# Patient Record
Sex: Female | Born: 1950
Health system: Southern US, Community
[De-identification: ages and names within clinical notes are randomized; demographics above are authoritative.]

## PROBLEM LIST (undated history)

## (undated) DIAGNOSIS — M26629 Arthralgia of temporomandibular joint, unspecified side: Secondary | ICD-10-CM

## (undated) DIAGNOSIS — G4733 Obstructive sleep apnea (adult) (pediatric): Secondary | ICD-10-CM

## (undated) DIAGNOSIS — G47419 Narcolepsy without cataplexy: Secondary | ICD-10-CM

## (undated) DIAGNOSIS — K219 Gastro-esophageal reflux disease without esophagitis: Secondary | ICD-10-CM

## (undated) DIAGNOSIS — I341 Nonrheumatic mitral (valve) prolapse: Secondary | ICD-10-CM

## (undated) DIAGNOSIS — Z8739 Personal history of other diseases of the musculoskeletal system and connective tissue: Secondary | ICD-10-CM

## (undated) DIAGNOSIS — Z9289 Personal history of other medical treatment: Secondary | ICD-10-CM

## (undated) DIAGNOSIS — F32A Depression, unspecified: Secondary | ICD-10-CM

## (undated) DIAGNOSIS — K449 Diaphragmatic hernia without obstruction or gangrene: Secondary | ICD-10-CM

## (undated) DIAGNOSIS — R002 Palpitations: Secondary | ICD-10-CM

## (undated) DIAGNOSIS — E785 Hyperlipidemia, unspecified: Secondary | ICD-10-CM

## (undated) DIAGNOSIS — T7840XA Allergy, unspecified, initial encounter: Secondary | ICD-10-CM

## (undated) DIAGNOSIS — D649 Anemia, unspecified: Secondary | ICD-10-CM

## (undated) DIAGNOSIS — M810 Age-related osteoporosis without current pathological fracture: Secondary | ICD-10-CM

## (undated) DIAGNOSIS — G4730 Sleep apnea, unspecified: Secondary | ICD-10-CM

## (undated) DIAGNOSIS — E039 Hypothyroidism, unspecified: Secondary | ICD-10-CM

## (undated) DIAGNOSIS — I10 Essential (primary) hypertension: Secondary | ICD-10-CM

## (undated) HISTORY — DX: Allergy, unspecified, initial encounter: T78.40XA

## (undated) HISTORY — PX: ABDOMINAL HYSTERECTOMY: SHX81

## (undated) HISTORY — DX: Depression, unspecified: F32.A

## (undated) HISTORY — PX: EYE SURGERY: SHX253

## (undated) HISTORY — DX: Hyperlipidemia, unspecified: E78.5

## (undated) HISTORY — PX: OTHER SURGICAL HISTORY: SHX169

## (undated) HISTORY — DX: Age-related osteoporosis without current pathological fracture: M81.0

## (undated) HISTORY — PX: BREAST CYST ASPIRATION: SHX578

## (undated) HISTORY — DX: Arthralgia of temporomandibular joint, unspecified side: M26.629

## (undated) HISTORY — PX: ROTATOR CUFF REPAIR: SHX139

## (undated) HISTORY — DX: Anemia, unspecified: D64.9

---
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
## 1998-01-04 ENCOUNTER — Ambulatory Visit: Admission: RE | Admit: 1998-01-04 | Discharge: 1998-01-04 | Payer: Self-pay | Admitting: Family Medicine

## 1998-06-16 ENCOUNTER — Ambulatory Visit: Admission: RE | Admit: 1998-06-16 | Discharge: 1998-06-16 | Payer: Self-pay | Admitting: Internal Medicine

## 2000-11-22 ENCOUNTER — Ambulatory Visit (HOSPITAL_COMMUNITY): Admission: RE | Admit: 2000-11-22 | Discharge: 2000-11-22 | Payer: Self-pay | Admitting: Internal Medicine

## 2000-11-22 ENCOUNTER — Encounter: Payer: Self-pay | Admitting: Internal Medicine

## 2001-02-25 ENCOUNTER — Other Ambulatory Visit: Admission: RE | Admit: 2001-02-25 | Discharge: 2001-02-25 | Payer: Self-pay | Admitting: *Deleted

## 2002-09-07 ENCOUNTER — Ambulatory Visit (HOSPITAL_COMMUNITY): Admission: RE | Admit: 2002-09-07 | Discharge: 2002-09-07 | Payer: Self-pay | Admitting: Internal Medicine

## 2002-09-07 ENCOUNTER — Encounter: Payer: Self-pay | Admitting: Internal Medicine

## 2002-09-30 ENCOUNTER — Encounter: Payer: Self-pay | Admitting: Internal Medicine

## 2002-09-30 ENCOUNTER — Ambulatory Visit (HOSPITAL_COMMUNITY): Admission: RE | Admit: 2002-09-30 | Discharge: 2002-09-30 | Payer: Self-pay | Admitting: Internal Medicine

## 2003-01-28 ENCOUNTER — Encounter: Payer: Self-pay | Admitting: Family Medicine

## 2003-01-28 ENCOUNTER — Ambulatory Visit (HOSPITAL_COMMUNITY): Admission: RE | Admit: 2003-01-28 | Discharge: 2003-01-28 | Payer: Self-pay | Admitting: Family Medicine

## 2003-04-02 ENCOUNTER — Emergency Department (HOSPITAL_COMMUNITY): Admission: EM | Admit: 2003-04-02 | Discharge: 2003-04-02 | Payer: Self-pay | Admitting: *Deleted

## 2003-07-01 ENCOUNTER — Emergency Department (HOSPITAL_COMMUNITY): Admission: EM | Admit: 2003-07-01 | Discharge: 2003-07-01 | Payer: Self-pay | Admitting: Emergency Medicine

## 2003-08-13 ENCOUNTER — Emergency Department (HOSPITAL_COMMUNITY): Admission: EM | Admit: 2003-08-13 | Discharge: 2003-08-14 | Payer: Self-pay | Admitting: *Deleted

## 2006-11-21 ENCOUNTER — Ambulatory Visit (HOSPITAL_COMMUNITY): Admission: RE | Admit: 2006-11-21 | Discharge: 2006-11-22 | Payer: Self-pay | Admitting: Orthopedic Surgery

## 2007-12-17 ENCOUNTER — Ambulatory Visit (HOSPITAL_COMMUNITY): Admission: RE | Admit: 2007-12-17 | Discharge: 2007-12-17 | Payer: Self-pay | Admitting: Family Medicine

## 2007-12-29 ENCOUNTER — Ambulatory Visit: Payer: Self-pay | Admitting: Internal Medicine

## 2008-01-20 ENCOUNTER — Encounter: Payer: Self-pay | Admitting: Internal Medicine

## 2008-01-20 ENCOUNTER — Ambulatory Visit (HOSPITAL_COMMUNITY): Admission: RE | Admit: 2008-01-20 | Discharge: 2008-01-20 | Payer: Self-pay | Admitting: Internal Medicine

## 2008-01-20 ENCOUNTER — Ambulatory Visit: Payer: Self-pay | Admitting: Internal Medicine

## 2008-02-19 ENCOUNTER — Ambulatory Visit: Payer: Self-pay | Admitting: Internal Medicine

## 2010-08-08 ENCOUNTER — Ambulatory Visit (HOSPITAL_COMMUNITY)
Admission: RE | Admit: 2010-08-08 | Discharge: 2010-08-08 | Payer: Self-pay | Source: Home / Self Care | Attending: Internal Medicine | Admitting: Internal Medicine

## 2010-08-15 ENCOUNTER — Encounter (INDEPENDENT_AMBULATORY_CARE_PROVIDER_SITE_OTHER): Payer: Self-pay | Admitting: *Deleted

## 2010-08-31 NOTE — Letter (Signed)
Summary: Unable to Reach, Consult Scheduled  [facility]  [address]   [place], [place] [address]   Phone: [phone]  Fax: [phone]    08/15/2010  Robyn Ashley [address] [place], [place]  [address] 1950/07/31   Dear [name],   We have been unable to reach you by phone.   At the recommendation of DR [facility]  we have been asked to schedule you a consult with DR Jena Gauss for DYSPHAGIA.  Please call our office at [phone].     Thank you,    Robyn Ashley  [facility] R. Roetta Sessions, M.D.    [name], M.D. Lorenza Burton, FNP-BC    Tana Coast, PA-C Phone: [phone]    Fax: [phone]

## 2010-11-13 ENCOUNTER — Other Ambulatory Visit (HOSPITAL_COMMUNITY): Payer: Self-pay | Admitting: Internal Medicine

## 2010-11-13 DIAGNOSIS — M5412 Radiculopathy, cervical region: Secondary | ICD-10-CM

## 2010-11-13 DIAGNOSIS — M542 Cervicalgia: Secondary | ICD-10-CM

## 2010-11-15 ENCOUNTER — Ambulatory Visit (HOSPITAL_COMMUNITY)
Admission: RE | Admit: 2010-11-15 | Discharge: 2010-11-15 | Disposition: A | Discharge status: Home / Self Care | Payer: BC Managed Care – PPO | Source: Ambulatory Visit | Attending: Internal Medicine | Admitting: Internal Medicine

## 2010-11-15 DIAGNOSIS — M5412 Radiculopathy, cervical region: Secondary | ICD-10-CM

## 2010-11-15 DIAGNOSIS — M542 Cervicalgia: Secondary | ICD-10-CM

## 2010-11-20 ENCOUNTER — Ambulatory Visit (HOSPITAL_COMMUNITY)
Admission: RE | Admit: 2010-11-20 | Discharge: 2010-11-20 | Disposition: A | Discharge status: Home / Self Care | Payer: BC Managed Care – PPO | Source: Ambulatory Visit | Attending: Internal Medicine | Admitting: Internal Medicine

## 2010-11-20 DIAGNOSIS — R209 Unspecified disturbances of skin sensation: Secondary | ICD-10-CM | POA: Insufficient documentation

## 2010-11-20 DIAGNOSIS — M5030 Other cervical disc degeneration, unspecified cervical region: Secondary | ICD-10-CM | POA: Insufficient documentation

## 2010-11-20 DIAGNOSIS — M542 Cervicalgia: Secondary | ICD-10-CM | POA: Insufficient documentation

## 2010-12-12 NOTE — Consult Note (Signed)
NAMEKENSLEI, HEARTY               ACCOUNT NO.:  [id number]   MEDICAL RECORD NO.:  [id number]          PATIENT TYPE:  AMB   LOCATION:  DAY                           FACILITY:  [facility]   PHYSICIAN:  R. Roetta Sessions, M.D. DATE OF BIRTH:  05/24/1951   DATE OF CONSULTATION:  12/29/2007  DATE OF DISCHARGE:                                 CONSULTATION   REQUESTING PHYSICIAN:  Kirk Ruths, M.D.   CONSULTING PHYSICIAN:  R. Roetta Sessions, M.D.   REASON FOR CONSULTATION:  Chronic nausea for 4 weeks and intermittent  upper abdominal pain.   HISTORY OF PRESENT ILLNESS:  Ms. Robyn Ashley is a 60 year old female.  She  reports a 4-week history of intermittent nausea.  It can last several  hours throughout the day.  She is pretty much having symptoms on a daily  basis.  There is no particular time or day where her symptoms are worse.  There are no certain foods that make her symptoms worse and eating does  not make her symptoms worse.  She complains of a queasy stomach all the  time.  Phenergan seems to help.  She tells me she has not been able to  work.  She also has transient epigastric and right upper quadrant pain,  which she describes as a tenderness.  It usually lasts only a few  minutes, however.  She complains of chronic heartburn and indigestion.  She has been taking Prilosec 40 mg in the a.m., which does seem to help  some.  She occasionally has breakthrough symptoms as well.  Her weight  has remained stable.  Her appetite is quite poor at times.  She denies  any vomiting.  She denies any diarrhea or constipation.  Other than her  normal alterations in bowel movements which she describes as IBS, she  denies any rectal bleeding or melena.  She does note new medication of  INH, which she was started in February 2009 for latent TB.  Lab work  from Dec 15, 2007, shows an AST of 105 and an ALT of 143, otherwise  normal LFTs.  She had a hemoglobin of 13, hematocrit of 39.8, platelet  count of  355, and a white blood cell count of 8.5.  She had an  ultrasound on Dec 17, 2007, which showed gallbladder normally distended  without stones or wall thickening.  CBD was 3.8 mm, otherwise a  completely normal exam.  She had hepatitis A, B, and C markers which  were negative.   PAST MEDICAL AND SURGICAL HISTORY:  1. Narcolepsy.  2. Mitral valve prolapse.  3. IBS.  4. Depression.  5. She has a history of erosive esophagitis with last EGD by Dr. Jena Gauss      on August 12, 1998, and also had a 3-cm hiatal hernia.  6. She has latent TB.  7. Osteoarthritis.  8. Right rotator cuff repair.  9. Status post hysterectomy.  10.Minor surgery.  11.She has had a colonoscopy in 1994 by Dr. Jena Gauss, which was normal.   CURRENT MEDICATIONS:  1. INH 300 mg daily.  2. Wellbutrin 150  mg b.i.d.  3. Ritalin 15 mg t.i.d.  4. Dexedrine 15 mg t.i.d.  5. Prilosec 40 mg daily.  6. Tylenol p.r.n.  7. Advil p.r.n.  8. Phenergan p.r.n.   ALLERGIES:  No known drug allergies.   FAMILY HISTORY:  Positive for maternal grandfather with colon cancer.  Mother is alive with diabetes mellitus and Alzheimer disease.  Father  deceased at 8 secondary to COPD.  She has 7 healthy siblings.   SOCIAL HISTORY:  Ms. Carboni is married.  She has 3 healthy children.  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  She has a 38-pack-a-year  history of tobacco use.  She denies alcohol or drug use.   REVIEW OF SYSTEMS:  See HPI, otherwise negative.   PHYSICAL EXAMINATION:  VITAL SIGNS:  Weight 150 pounds, height 62  inches, temperature 98 degrees, blood pressure 128/80, and pulse 80.  GENERAL:  Ms. Bronk is a well-developed, well-nourished Caucasian  female in no acute distress.  HEENT:  Clear sclerae, nonicteric.  Conjunctivae pink.  Oropharynx is  pink and moist without any lesions.  NECK:  Supple without mass or thyromegaly.  CHEST:  Heart has regular rate and rhythm.  Normal S1 and S2 without any  murmurs, clicks,  rubs, or gallops.  LUNGS:  Clear to auscultation bilaterally.  ABDOMEN:  Positive bowel sounds x4.  No bruits auscultated.  Soft,  nontender, and nondistended.  No palpable mass or hepatosplenomegaly.  No rebound, tenderness, or guarding.  EXTREMITIES:  Without clubbing or edema bilaterally.  SKIN:  Pink, warm, and dry without any rash or jaundice.   IMPRESSION:  Ms. Hellickson is a 60 year old female with a 4-week history of  chronic nausea without emesis.  She also has transient upper abdominal  pain, mostly epigastric and right upper quadrant and was recently found  to have an AST of 105 and ALT of 143.  Abdominal ultrasound showed a  normal gallbladder and common bile duct, and she had acute hepatitis  panel.  Her symptoms are not typical of biliary as her pain is quite  transient and does not necessarily correlate with her nausea which seems  to be a chronic daily problem.  Much of this has started since she has  been started on INH for latent tuberculosis.  I am wondering if she is  not having complications from this particular drug.  Other drugs that  are metabolized in the liver include Dexedrine and Ritalin, all of which  could be contributing to her transaminitis and nausea.  However, prior  to discontinuing any of her medication, we would pursue EGD to rule out  peptic ulcer disease as a culprit of her nausea.  Biliary etiology  should remain in the differential, however.   PLAN:  1. Screening colonoscopy and EGD with Dr. Jena Gauss in near future.  I      discussed both procedures including risks and benefits, including      but not limited to bleeding, infection, perforation, and drug      reaction.  She agrees to the      plan and consent will be obtained.  2. Continue Prilosec 40 mg daily.  3. LFTs on the day of procedure.      Lorenza Burton, N.P.      Jonathon Bellows, M.D.  Electronically Signed    KJ/MEDQ  D:  12/29/2007  T:  12/30/2007  Job:  [id number]   cc:    Kirk Ruths, M.D.  Fax: [phone]

## 2010-12-12 NOTE — Assessment & Plan Note (Signed)
NAMEMarland Ashley  PAISLYN, DOMENICO                CHART#:  [id number]   DATE:  02/19/2008                       DOB:  1951-03-24   PRIMARY CARE PHYSICIAN:  Kirk Ruths, MD   CHIEF COMPLAINT:  Followup transaminitis and status post EGD.   PROBLEM LIST:  1. Acute nausea, vomiting, and right upper quadrant abdominal pain      with normal abdominal ultrasound, has resolved.  2. Transaminitis felt to be due to INH.  3. Narcolepsy.  4. Mitral valve prolapse.  5. IBS.  6. Depression.  7. Erosive esophagitis.  Last EGD was normal by Dr. Jena Gauss on      01/20/2008.  She had a small hiatal hernia.  8. Normal colonoscopy.  She did have a benign polyp removed from the      descending colon.  9. Latent TB.  10.Osteoarthritis.  11.Status post hysterectomy.   SUBJECTIVE:  The patient is a 60 year old Caucasian female who presented  with acute nausea, vomiting, and right upper quadrant abdominal pain.  She was found to have elevated LFTs, AST, and ALT.  Abdominal ultrasound  showed a normal gallbladder and common bile duct.  She had a negative  acute hepatitis panel.  Her transaminitis was felt to be due to INH for  latent tuberculosis.  She does continue to have occasional heartburn and  breakthrough symptoms on Prilosec 20 mg daily.  She does occasionally  have jaw pain and severe indigestion.  It is not necessarily directly  after eating her meals.  She tells me she had best relief when she was  on Aciphex 20 mg b.i.d., but overall she feels much better since she has  been off the INH.  She did start rifampin 600 mg daily.  She is being  followed by the [facility] and they  are checking LFTs and CBC on a regular basis.   CURRENT MEDICATIONS:  See the list of 02/19/2008.   ALLERGIES:  No known drug allergies.   OBJECTIVE:  VITAL SIGNS:  Weight 152 pounds, height 62 inches, temp  97.8, blood pressure 130/82, and pulse 80.  GENERAL:  The patient is a  60 year old Caucasian female who is alert,  orient, pleasant, cooperative, and in no acute distress.  HEENT:  Sclerae clear, nonicteric.  Conjunctivae pink.  Oropharynx pink  and moist without any lesions.  CHEST:  Heart has regular rate and rhythm.  Normal S1 and S2.  ABDOMEN:  Positive bowel sounds x4.  No bruits auscultated.  Soft,  nontender, and nondistended without palpable mass or hepatosplenomegaly.  No rebound, tenderness, or guarding.  EXTREMITIES:  Without clubbing or edema.   ASSESSMENT:  The patient is a 60 year old Caucasian female with a recent  transaminitis, chronic nausea, vomiting, and abdominal pain, felt to be  due to isoniazid.  Recent abdominal ultrasound was reassuring and  apparently, she has had lab work done through [employer],  which shows near normal LFTs and will be requesting those records.  She  does have some breakthrough severe indigestion, and if this persists,  would pursue HIDA scan to further evaluate gallbladder function, as she  may have passed sludge or microlithiasis, although none was seen on  ultrasound to attribute a bump in her transaminases.   PLAN:  1. We will request recent  LFTs through the [facility]      Department.  2. We will resume Aciphex 20 mg b.i.d.  We have given her two weeks'      worth of samples.  3. Office visit in 6 months, if her LFTs are normal.  Otherwise, she      is going to call me in 2 weeks, if her symptoms are not completely      resolved on b.i.d. Aciphex and we will set her up for a HIDA scan      at that point.       Lorenza Burton, N.P.  Electronically Signed     R. Roetta Sessions, M.D.  Electronically Signed    KJ/MEDQ  D:  02/19/2008  T:  02/19/2008  Job:  [id number]   cc:   Kirk Ruths, M.D.  [facility]

## 2010-12-12 NOTE — Op Note (Signed)
Robyn Ashley, Robyn Ashley               ACCOUNT NO.:  [id number]   MEDICAL RECORD NO.:  [id number]          PATIENT TYPE:  AMB   LOCATION:  DAY                           FACILITY:  [facility]   PHYSICIAN:  R. Roetta Sessions, M.D. DATE OF BIRTH:  May 13, 1951   DATE OF PROCEDURE:  01/20/2008  DATE OF DISCHARGE:                               OPERATIVE REPORT   INDICATIONS FOR PROCEDURE:  A 60 year old lady with 4-week history of  chronic nausea and vomiting and some right upper quadrant abdominal  pain.  Gallbladder ultrasound came back normal.  She has been on INH for  latent TB.  Dr. Petra Kuba, her pulmonologist stopped her INH 2 weeks  ago when she learned of elevated liver enzyme.  Clinically she is not  really changed any since stopping the INH.  She has a hepatic profile  pending from yesterday per her report.  It has been well over 10 year  since she had screening colonoscopy.  She is having no lower digestive  symptoms.  EGD and colonoscopy all have been done.  Potential risks,  benefits, and alternatives have been reviewed and questions were  answered.  She is agreeable.  Please see the documentation in the  medical record.   PROCEDURE NOTE:  O2 saturation, blood pressure, pulse, and respiration  monitored throughout the entirety of both procedure.  Conscious  sedation, Versed 4 mg IV and Demerol 100 mg IV in divided doses.  Cetacaine spray for topical pharyngeal anesthesia.   INSTRUMENT:  Pentax video chip system.   EGG examination of the tubular esophagus revealed no mucosal  abnormalities.  EG junction was passed and the scope easily traversed  and into her stomach.  Gastric cavity was emptied, and insufflated well  with air.  Thorough examination of the gastric mucosa including  retroflexed view of the proximal stomach esophagogastric junction  demonstrated only a small hiatal hernia.  Pylorus was patent and easily  traversed.  Examination of the bulb, second portion revealed no  abnormalities.   THERAPEUTIC/DIAGNOSTIC MANEUVERS PERFORMED:  None.   The patient tolerated the procedure well.  Prior to colonoscopy and  digital rectal exam revealed no abnormalities.   ENDOSCOPIC FINDINGS:  The prep was adequate. Colon:  Colonic mucosa was  surveyed from the rectosigmoid junction through the left transverse, the  right colon, through the appendiceal orifice, ileocecal valve, and  cecum.  These structures were well seen and photographed for the record.  Terminal ileal was intubated 10 cm.  From this level, scope was slowly  cautiously withdrawn.  All previously mentioned mucosal surfaces were  again seen.  The patient had a single diminutive polyp in the descending  colon which was about 2 mm which was snared and the rest of the mucosa  appeared normal.  The scope was pulled out of the rectum.  The  examination of the rectal mucosa including retroflexed view of the anal  verge demonstrated no abnormalities.  The patient tolerated both of the  procedure well and was reacted in Endoscopy.   IMPRESSION:  EGD:  Normal esophagus, partial EG junction, small hiatal  hernia.  Otherwise normal stomach, D1 and D2.  Colonoscopy demonstrated  normal rectum.  Dimunitive colon polyps were removed.  The remainder of  colonic mucosa and terminal ileal mucosa appeared normal.   RECOMMENDATIONS:  1. Continue Prilosec 40 mg orally daily.  2. Follow up on LFTs.  3. Follow up with [place] in 1 month, so we can assess her progress.  She      may need further evaluation.      Jonathon Bellows, M.D.  Electronically Signed     RMR/MEDQ  D:  01/20/2008  T:  01/21/2008  Job:  [id number]   cc:   Mina Marble, M.D.  Fax: [phone]   Oneal Deputy. Juanetta Gosling, M.D.  Fax: [phone]

## 2010-12-15 NOTE — Op Note (Signed)
Robyn Ashley, Robyn Ashley               ACCOUNT NO.:  [id number]   MEDICAL RECORD NO.:  [id number]          PATIENT TYPE:  AMB   LOCATION:  SDS                          FACILITY:  [facility]   PHYSICIAN:  Vania Rea. Supple, M.D.  DATE OF BIRTH:  10/12/1950   DATE OF PROCEDURE:  11/21/2006  DATE OF DISCHARGE:                               OPERATIVE REPORT   PREOPERATIVE DIAGNOSES:  1. Chronic right shoulder impingement syndrome.  2. Right shoulder symptomatic acromioclavicular joint arthropathy.   POSTOPERATIVE DIAGNOSES:  1. Chronic right shoulder impingement syndrome.  2. Right shoulder symptomatic acromioclavicular joint arthropathy.  3. Partial articular rotator cuff tear.  4. Complex degenerative labral tear.   PROCEDURE:  1. Right shoulder examination under anesthesia.  2. Right shoulder diagnostic arthroscopy.  3. Debridement of partial articular rotator cuff tear.  4. Debridement of extensive superior labral tear.  5. Arthroscopic subacromial decompression bursectomy.  6. Arthroscopic distal clavicle resection.   SURGEON OF RECORD:  Vania Rea. Supple, M.D.   ASSISTANTFrench Ana Shuford PA-C.   ANESTHESIA:  General endotracheal as well as a preop interscalene block.   ESTIMATED BLOOD LOSS:  Minimal.   DRAINS:  None.   HISTORY:  Robyn Ashley is a 60 year old female who has had chronic right  shoulder pain with weakness and restriction of mobility which has been  refractory to prolonged attempts at conservative management.  Due to her  ongoing pain and functional limitations, she is brought to the operating  at this time for planned right shoulder arthroscopy as described below.   Preoperative counseled Robyn Ashley on treatment options as well as risks  versus benefits thereof and possible surgical complications of bleeding,  infection, neurovascular injury, persistent pain, loss of motion,  anesthetic complications, recurrence of progression to rotator cuff  tear, possible need for  additional surgery were all reviewed.  She  understands and accepts and agrees with our planned procedure.   PROCEDURE IN DETAIL:  After undergoing routine preop evaluation, the  patient received prophylactic antibiotics.  An interscalene block was  established in the holding area by the anesthesia department.  Placed  supine on the operating table and underwent smooth induction of a  general endotracheal anesthesia.  Turned to the left lateral decubitus  position on the beanbag and appropriately padded and protected.  Right  shoulder examination under anesthesia revealed full passive motion.  There are no obvious instability patterns.  The right arm suspended at  70 degrees of abduction with 10 pounds of traction.  The right shoulder  girdle region was sterilely prepped and draped in standard fashion.  Posterior portal established in glenohumeral joint and anterior portal  established under direct visualization.  The glenohumeral articular  surfaces were in good condition.  There was a partial articular rotator  cuff tear noted which was debrided.  I would estimate that this was  approximately 15-20% thickness of the tendon.  A tag suture 0-0 PDS was  passed at this point.  There was a degenerative type 1 superior labral  tear which was debrided with a shaver.  The biceps tendon itself  was in  good condition showed normal caliber and no evidence for instability at  either the superior glenoid attachment or distally at the rotator  interval.  There were no obvious instability patterns.  At this point  final inspection and irrigation was completed.  Fluid and instruments  were removed.  Arm was dropped down to 30 degrees abduction with the  arthroscope introduced in the subacromial space through the posterior  portal and direct lateral portal established in the subacromial space.  There were multiple adhesions, abundant proliferative bursal tissue and  this was all resected with  combination of the shaver and the Arthrex  wand.  The wand was then used to remove the periosteum from the  undersurface of the anterior half of the acromion.  Subacromial  decompression was then performed with a bur creating a type 1  morphology.  Of note, the anterior acromion was directly impinging on  the rotator cuff and this area was nicely decompressed with a  subacromial compression.  A portal was established directly anterior to  the distal clavicle and distal clavicle resection performed with a bur.  Care was taken to make sure the entire circumference of the distal  clavicle could be visualized to ensure adequate removal of bone.  We  then completed the subacromial bursectomy and carefully inspected bursal  surface of the rotator cuff.  We probed the region surrounding the tag  suture and did not find evidence to suggest the rotator cuff was  significantly deficient or attenuated.  As such a rotator cuff repair  was not indicated.  Final hemostasis was obtained.  Bursectomy was  completed.  Fluid and instruments were removed.  The ports closed with  Monocryl and Steri-Strips.  Bulky dry dressing taped over the right  shoulder, right arm was placed a sling immobilizer.  The patient was  rolled supine, extubated, taken to recovery room in stable condition.      Vania Rea. Supple, M.D.  Electronically Signed     KMS/MEDQ  D:  11/21/2006  T:  11/21/2006  Job:  [id number]

## 2011-01-05 ENCOUNTER — Other Ambulatory Visit: Payer: Self-pay | Admitting: Neurosurgery

## 2011-01-05 DIAGNOSIS — M5410 Radiculopathy, site unspecified: Secondary | ICD-10-CM

## 2011-01-05 DIAGNOSIS — M542 Cervicalgia: Secondary | ICD-10-CM

## 2011-01-10 ENCOUNTER — Ambulatory Visit
Admission: RE | Admit: 2011-01-10 | Discharge: 2011-01-10 | Disposition: A | Discharge status: Home / Self Care | Payer: BC Managed Care – PPO | Source: Ambulatory Visit | Attending: Neurosurgery | Admitting: Neurosurgery

## 2011-01-10 DIAGNOSIS — M5410 Radiculopathy, site unspecified: Secondary | ICD-10-CM

## 2011-01-10 DIAGNOSIS — M542 Cervicalgia: Secondary | ICD-10-CM

## 2011-08-31 ENCOUNTER — Other Ambulatory Visit (HOSPITAL_COMMUNITY): Payer: Self-pay | Admitting: Internal Medicine

## 2011-08-31 DIAGNOSIS — Z139 Encounter for screening, unspecified: Secondary | ICD-10-CM

## 2011-09-04 ENCOUNTER — Ambulatory Visit (HOSPITAL_COMMUNITY): Payer: BC Managed Care – PPO

## 2011-09-07 ENCOUNTER — Ambulatory Visit (HOSPITAL_COMMUNITY)
Admission: RE | Admit: 2011-09-07 | Discharge: 2011-09-07 | Disposition: A | Discharge status: Home / Self Care | Payer: BC Managed Care – PPO | Source: Ambulatory Visit | Attending: Internal Medicine | Admitting: Internal Medicine

## 2011-09-07 DIAGNOSIS — Z139 Encounter for screening, unspecified: Secondary | ICD-10-CM

## 2011-09-07 DIAGNOSIS — Z1231 Encounter for screening mammogram for malignant neoplasm of breast: Secondary | ICD-10-CM | POA: Insufficient documentation

## 2013-07-06 ENCOUNTER — Other Ambulatory Visit (HOSPITAL_COMMUNITY): Payer: Self-pay | Admitting: Family Medicine

## 2013-07-06 DIAGNOSIS — Z139 Encounter for screening, unspecified: Secondary | ICD-10-CM

## 2013-07-07 ENCOUNTER — Ambulatory Visit (HOSPITAL_COMMUNITY)
Admission: RE | Admit: 2013-07-07 | Discharge: 2013-07-07 | Disposition: A | Discharge status: Home / Self Care | Payer: BC Managed Care – PPO | Source: Ambulatory Visit | Attending: Family Medicine | Admitting: Family Medicine

## 2013-07-07 DIAGNOSIS — Z139 Encounter for screening, unspecified: Secondary | ICD-10-CM

## 2013-07-07 DIAGNOSIS — Z1231 Encounter for screening mammogram for malignant neoplasm of breast: Secondary | ICD-10-CM | POA: Insufficient documentation

## 2013-07-17 ENCOUNTER — Ambulatory Visit (HOSPITAL_COMMUNITY)
Admission: RE | Admit: 2013-07-17 | Discharge: 2013-07-17 | Disposition: A | Discharge status: Home / Self Care | Payer: BC Managed Care – PPO | Source: Ambulatory Visit | Attending: Family Medicine | Admitting: Family Medicine

## 2013-07-17 ENCOUNTER — Other Ambulatory Visit (HOSPITAL_COMMUNITY): Payer: Self-pay | Admitting: Family Medicine

## 2013-07-17 DIAGNOSIS — R0789 Other chest pain: Secondary | ICD-10-CM | POA: Insufficient documentation

## 2013-07-17 DIAGNOSIS — I10 Essential (primary) hypertension: Secondary | ICD-10-CM

## 2013-07-17 DIAGNOSIS — R079 Chest pain, unspecified: Secondary | ICD-10-CM

## 2013-07-17 DIAGNOSIS — E785 Hyperlipidemia, unspecified: Secondary | ICD-10-CM

## 2013-07-20 ENCOUNTER — Other Ambulatory Visit (HOSPITAL_COMMUNITY): Payer: Self-pay | Admitting: Podiatry

## 2013-07-20 DIAGNOSIS — M8430XG Stress fracture, unspecified site, subsequent encounter for fracture with delayed healing: Secondary | ICD-10-CM

## 2013-07-21 ENCOUNTER — Encounter (HOSPITAL_COMMUNITY): Payer: Self-pay

## 2013-07-21 ENCOUNTER — Ambulatory Visit (HOSPITAL_COMMUNITY)
Admission: RE | Admit: 2013-07-21 | Discharge: 2013-07-21 | Disposition: A | Discharge status: Home / Self Care | Payer: BC Managed Care – PPO | Source: Ambulatory Visit | Attending: Podiatry | Admitting: Podiatry

## 2013-07-21 DIAGNOSIS — Z4789 Encounter for other orthopedic aftercare: Secondary | ICD-10-CM | POA: Insufficient documentation

## 2013-07-21 DIAGNOSIS — M8430XG Stress fracture, unspecified site, subsequent encounter for fracture with delayed healing: Secondary | ICD-10-CM

## 2013-07-21 DIAGNOSIS — M79609 Pain in unspecified limb: Secondary | ICD-10-CM | POA: Insufficient documentation

## 2013-07-21 DIAGNOSIS — M19079 Primary osteoarthritis, unspecified ankle and foot: Secondary | ICD-10-CM | POA: Insufficient documentation

## 2013-07-27 DIAGNOSIS — Z9289 Personal history of other medical treatment: Secondary | ICD-10-CM

## 2013-07-27 HISTORY — DX: Personal history of other medical treatment: Z92.89

## 2013-07-30 DIAGNOSIS — I499 Cardiac arrhythmia, unspecified: Secondary | ICD-10-CM

## 2013-07-30 DIAGNOSIS — I209 Angina pectoris, unspecified: Secondary | ICD-10-CM

## 2013-07-30 HISTORY — DX: Cardiac arrhythmia, unspecified: I49.9

## 2013-07-30 HISTORY — DX: Angina pectoris, unspecified: I20.9

## 2013-12-14 ENCOUNTER — Other Ambulatory Visit (HOSPITAL_COMMUNITY): Payer: Self-pay | Admitting: Family Medicine

## 2013-12-14 DIAGNOSIS — Z8739 Personal history of other diseases of the musculoskeletal system and connective tissue: Secondary | ICD-10-CM

## 2013-12-14 DIAGNOSIS — S92909A Unspecified fracture of unspecified foot, initial encounter for closed fracture: Secondary | ICD-10-CM

## 2013-12-17 ENCOUNTER — Ambulatory Visit (HOSPITAL_COMMUNITY)
Admission: RE | Admit: 2013-12-17 | Discharge: 2013-12-17 | Disposition: A | Discharge status: Home / Self Care | Payer: BC Managed Care – PPO | Source: Ambulatory Visit | Attending: Family Medicine | Admitting: Family Medicine

## 2013-12-17 DIAGNOSIS — Z8739 Personal history of other diseases of the musculoskeletal system and connective tissue: Secondary | ICD-10-CM

## 2013-12-17 DIAGNOSIS — Y929 Unspecified place or not applicable: Secondary | ICD-10-CM | POA: Insufficient documentation

## 2013-12-17 DIAGNOSIS — S92909A Unspecified fracture of unspecified foot, initial encounter for closed fracture: Secondary | ICD-10-CM

## 2013-12-17 DIAGNOSIS — X58XXXA Exposure to other specified factors, initial encounter: Secondary | ICD-10-CM | POA: Insufficient documentation

## 2014-02-23 ENCOUNTER — Telehealth: Payer: Self-pay | Admitting: *Deleted

## 2014-02-23 NOTE — Telephone Encounter (Signed)
POTASSIUM NEEDS CALLED IN TO [facility]

## 2014-02-24 NOTE — Telephone Encounter (Signed)
Pt states that she hasn't been seen here and did not call for a refill on potassium

## 2014-07-29 ENCOUNTER — Ambulatory Visit (HOSPITAL_COMMUNITY)
Admission: RE | Admit: 2014-07-29 | Discharge: 2014-07-29 | Disposition: A | Discharge status: Home / Self Care | Payer: Disability Insurance | Source: Ambulatory Visit | Attending: Family Medicine | Admitting: Family Medicine

## 2014-07-29 ENCOUNTER — Other Ambulatory Visit (HOSPITAL_COMMUNITY): Payer: Self-pay | Admitting: Family Medicine

## 2014-07-29 DIAGNOSIS — M542 Cervicalgia: Secondary | ICD-10-CM

## 2014-07-29 DIAGNOSIS — M2011 Hallux valgus (acquired), right foot: Secondary | ICD-10-CM | POA: Insufficient documentation

## 2014-07-29 DIAGNOSIS — M19071 Primary osteoarthritis, right ankle and foot: Secondary | ICD-10-CM | POA: Diagnosis not present

## 2014-07-29 DIAGNOSIS — M79671 Pain in right foot: Secondary | ICD-10-CM | POA: Insufficient documentation

## 2014-07-29 DIAGNOSIS — M2578 Osteophyte, vertebrae: Secondary | ICD-10-CM | POA: Insufficient documentation

## 2014-07-29 DIAGNOSIS — G8929 Other chronic pain: Secondary | ICD-10-CM | POA: Diagnosis present

## 2014-07-29 DIAGNOSIS — R52 Pain, unspecified: Secondary | ICD-10-CM

## 2014-07-29 DIAGNOSIS — M47892 Other spondylosis, cervical region: Secondary | ICD-10-CM | POA: Insufficient documentation

## 2014-08-18 ENCOUNTER — Other Ambulatory Visit (HOSPITAL_COMMUNITY): Payer: Self-pay | Admitting: Family Medicine

## 2014-08-18 DIAGNOSIS — Z1231 Encounter for screening mammogram for malignant neoplasm of breast: Secondary | ICD-10-CM

## 2014-08-27 ENCOUNTER — Ambulatory Visit (HOSPITAL_COMMUNITY)
Admission: RE | Admit: 2014-08-27 | Discharge: 2014-08-27 | Disposition: A | Discharge status: Home / Self Care | Payer: Disability Insurance | Source: Ambulatory Visit | Attending: Family Medicine | Admitting: Family Medicine

## 2014-08-27 DIAGNOSIS — Z1231 Encounter for screening mammogram for malignant neoplasm of breast: Secondary | ICD-10-CM | POA: Insufficient documentation

## 2014-09-03 ENCOUNTER — Encounter: Payer: Self-pay | Admitting: Internal Medicine

## 2015-10-14 ENCOUNTER — Other Ambulatory Visit: Payer: Self-pay | Admitting: Internal Medicine

## 2015-10-14 DIAGNOSIS — Z1231 Encounter for screening mammogram for malignant neoplasm of breast: Secondary | ICD-10-CM

## 2015-12-28 ENCOUNTER — Other Ambulatory Visit: Payer: Self-pay | Admitting: Internal Medicine

## 2015-12-28 DIAGNOSIS — Z1231 Encounter for screening mammogram for malignant neoplasm of breast: Secondary | ICD-10-CM

## 2016-01-13 ENCOUNTER — Ambulatory Visit
Admission: RE | Admit: 2016-01-13 | Discharge: 2016-01-13 | Disposition: A | Discharge status: Home / Self Care | Payer: No Typology Code available for payment source | Source: Ambulatory Visit | Attending: Internal Medicine | Admitting: Internal Medicine

## 2016-01-13 DIAGNOSIS — Z1231 Encounter for screening mammogram for malignant neoplasm of breast: Secondary | ICD-10-CM

## 2016-12-03 ENCOUNTER — Other Ambulatory Visit: Payer: Self-pay | Admitting: Internal Medicine

## 2016-12-03 DIAGNOSIS — Z1231 Encounter for screening mammogram for malignant neoplasm of breast: Secondary | ICD-10-CM

## 2017-05-03 DIAGNOSIS — Z6829 Body mass index (BMI) 29.0-29.9, adult: Secondary | ICD-10-CM | POA: Diagnosis not present

## 2017-05-03 DIAGNOSIS — E663 Overweight: Secondary | ICD-10-CM | POA: Diagnosis not present

## 2017-05-03 DIAGNOSIS — J301 Allergic rhinitis due to pollen: Secondary | ICD-10-CM | POA: Diagnosis not present

## 2017-05-03 DIAGNOSIS — Z1389 Encounter for screening for other disorder: Secondary | ICD-10-CM | POA: Diagnosis not present

## 2017-05-03 DIAGNOSIS — J019 Acute sinusitis, unspecified: Secondary | ICD-10-CM | POA: Diagnosis not present

## 2017-05-21 DIAGNOSIS — K219 Gastro-esophageal reflux disease without esophagitis: Secondary | ICD-10-CM | POA: Diagnosis not present

## 2017-05-21 DIAGNOSIS — E559 Vitamin D deficiency, unspecified: Secondary | ICD-10-CM | POA: Diagnosis not present

## 2017-05-21 DIAGNOSIS — Z6828 Body mass index (BMI) 28.0-28.9, adult: Secondary | ICD-10-CM | POA: Diagnosis not present

## 2017-05-21 DIAGNOSIS — R400 Somnolence: Secondary | ICD-10-CM | POA: Diagnosis not present

## 2017-05-21 DIAGNOSIS — Z23 Encounter for immunization: Secondary | ICD-10-CM | POA: Diagnosis not present

## 2017-05-21 DIAGNOSIS — Z1389 Encounter for screening for other disorder: Secondary | ICD-10-CM | POA: Diagnosis not present

## 2017-05-21 DIAGNOSIS — E063 Autoimmune thyroiditis: Secondary | ICD-10-CM | POA: Diagnosis not present

## 2017-05-21 DIAGNOSIS — G4733 Obstructive sleep apnea (adult) (pediatric): Secondary | ICD-10-CM | POA: Diagnosis not present

## 2017-05-21 DIAGNOSIS — R5383 Other fatigue: Secondary | ICD-10-CM | POA: Diagnosis not present

## 2017-05-21 DIAGNOSIS — M7062 Trochanteric bursitis, left hip: Secondary | ICD-10-CM | POA: Diagnosis not present

## 2017-05-21 DIAGNOSIS — G47419 Narcolepsy without cataplexy: Secondary | ICD-10-CM | POA: Diagnosis not present

## 2017-06-15 DIAGNOSIS — G4730 Sleep apnea, unspecified: Secondary | ICD-10-CM | POA: Diagnosis not present

## 2017-06-16 DIAGNOSIS — G4730 Sleep apnea, unspecified: Secondary | ICD-10-CM | POA: Diagnosis not present

## 2017-09-23 DIAGNOSIS — H6980 Other specified disorders of Eustachian tube, unspecified ear: Secondary | ICD-10-CM | POA: Diagnosis not present

## 2017-09-23 DIAGNOSIS — J019 Acute sinusitis, unspecified: Secondary | ICD-10-CM | POA: Diagnosis not present

## 2017-09-23 DIAGNOSIS — Z6828 Body mass index (BMI) 28.0-28.9, adult: Secondary | ICD-10-CM | POA: Diagnosis not present

## 2017-09-23 DIAGNOSIS — E663 Overweight: Secondary | ICD-10-CM | POA: Diagnosis not present

## 2017-11-21 ENCOUNTER — Encounter: Payer: Self-pay | Admitting: Internal Medicine

## 2018-01-27 DIAGNOSIS — Z1389 Encounter for screening for other disorder: Secondary | ICD-10-CM | POA: Diagnosis not present

## 2018-01-27 DIAGNOSIS — K219 Gastro-esophageal reflux disease without esophagitis: Secondary | ICD-10-CM | POA: Diagnosis not present

## 2018-01-27 DIAGNOSIS — B001 Herpesviral vesicular dermatitis: Secondary | ICD-10-CM | POA: Diagnosis not present

## 2018-01-27 DIAGNOSIS — R69 Illness, unspecified: Secondary | ICD-10-CM | POA: Diagnosis not present

## 2018-01-27 DIAGNOSIS — Z6828 Body mass index (BMI) 28.0-28.9, adult: Secondary | ICD-10-CM | POA: Diagnosis not present

## 2018-02-05 ENCOUNTER — Encounter: Payer: Self-pay | Admitting: Internal Medicine

## 2018-05-07 ENCOUNTER — Encounter: Payer: Self-pay | Admitting: *Deleted

## 2018-05-07 ENCOUNTER — Other Ambulatory Visit: Payer: Self-pay | Admitting: *Deleted

## 2018-05-07 ENCOUNTER — Encounter: Payer: Self-pay | Admitting: Nurse Practitioner

## 2018-05-07 ENCOUNTER — Ambulatory Visit (INDEPENDENT_AMBULATORY_CARE_PROVIDER_SITE_OTHER): Payer: Medicare Other | Admitting: Nurse Practitioner

## 2018-05-07 DIAGNOSIS — K219 Gastro-esophageal reflux disease without esophagitis: Secondary | ICD-10-CM

## 2018-05-07 DIAGNOSIS — R1310 Dysphagia, unspecified: Secondary | ICD-10-CM

## 2018-05-07 DIAGNOSIS — Z8719 Personal history of other diseases of the digestive system: Secondary | ICD-10-CM

## 2018-05-07 MED ORDER — NA SULFATE-K SULFATE-MG SULF 17.5-3.13-1.6 GM/177ML PO SOLN: 1.0000 | ORAL | 0 refills | Status: DC

## 2018-05-07 NOTE — Patient Instructions (Signed)
1. Have your stool studies completed when you are able to. 2. Stop taking Zegerid.  I am giving you samples of Dexilant 60 mg.  Take this on an empty stomach. 3. Call us in 1 to 2 weeks and let us know if it is helping. 4. We will call you with your results and further recommendations. 5. We will schedule your procedures for you. 6. Return for follow-up in 3 months. 7. Call us if you have any questions or concerns.  At [facility] we value your feedback. You may receive a survey about your visit today. Please share your experience as we strive to create trusting relationships with our patients to provide genuine, compassionate, quality care.  We appreciate your understanding and patience as we review any laboratory studies, imaging, and other diagnostic tests that are ordered as we care for you. Our office policy is 5 business days for review of these results, and any emergent or urgent results are addressed in a timely manner for your best interest. If you do not hear from our office in 1 week, please contact us.   We also encourage the use of MyChart, which contains your medical information for your review as well. If you are not enrolled in this feature, an access code is on this after visit summary for your convenience. Thank you for allowing [place] to be involved in your care.  It was great to meet you today!  I hope you have a great Fall!!

## 2018-05-07 NOTE — Assessment & Plan Note (Signed)
Chronic history of GERD, having significant breakthrough symptoms despite Zegerid 1-2 times a day.  She has been on Zegerid for many years.  If she misses a day of Zegerid she will have particularly severe symptoms.  At this point I will have her stop Zegerid and start Dexilant we will provide samples to last 1 to 2 weeks and request a progress report in 1 week.  Follow-up in 3 months.

## 2018-05-07 NOTE — Assessment & Plan Note (Signed)
History of IBS and states her bowel movements have never been regular.  Intermittent IBS symptoms.  Over the past 3 months she has had more frequent and urgent postprandial diarrhea with up to 6-7 stools a day but also as infrequently as once a day.  Her stools are typically loose and sometimes very mucousy, sometimes watery.  If she does have a day with multiple stools by the end of her bowel movements her abdominal pain typically resolves.  This seems consistent with a diagnosis of Crohn's.  Regardless, she is due for a repeat colonoscopy and we will schedule this to further evaluate and accomplish colon cancer screening.  I will also check stool studies to ensure she does not have an occult infection.  If she has no infection we can consider Bentyl to help.  Follow-up in 3 months.  Proceed with TCS with Dr. Gala Romney in near future: the risks, benefits, and alternatives have been discussed with the patient in detail. The patient states understanding and desires to proceed.  The patient is not on any anticoagulants, anxiolytics, chronic pain medications, or antidepressants.  Conscious sedation should be adequate for her procedure.

## 2018-05-07 NOTE — Assessment & Plan Note (Signed)
The patient describes solid food dysphagia which typically happens daily.  Occasionally she has a particularly bothersome episode as she did a couple weeks ago.  Sometimes with regurgitation, sometimes passes.  Chronic GERD symptoms which symptoms are not adequately managed at this time.  We will plan for an upper endoscopy with possible dilation to further evaluate and treat her dysphasia.  Follow-up in 3 months.  Proceed with EGD +/- dilation with Dr. Gala Romney in near future: the risks, benefits, and alternatives have been discussed with the patient in detail. The patient states understanding and desires to proceed.  The patient is not on any anticoagulants, anxiolytics, chronic pain medications, or antidepressants.  Conscious sedation should be adequate for her procedure.

## 2018-05-07 NOTE — Progress Notes (Signed)
Primary Care Physician:  Redmond School, MD Primary Gastroenterologist:  Dr. Gala Romney  Chief Complaint  Patient presents with  . Dysphagia    food is getting stuck on her throat and feels like everything is "stopping" in her chest  . Anorexia  . Diarrhea    now having to wear depends    HPI:   Robyn Ashley is a 67 y.o. female who presents on referral from primary care for dysphasia and IBS symptoms.  Patient has not been seen by our office since 2009.  Last colonoscopy completed 01/20/2008 in addition to EGD.  EGD found normal esophagus, small hiatal hernia, otherwise normal.  Colonoscopy found normal rectum, diminutive colon polyps and remainder of colonic and terminal ileal mucosa normal.  Surgical pathology found the polyp to be hyperplastic and no adenomatous change or malignancy.  Recommend he continue Prilosec, follow-up in LFTs, follow-up in 1 month.  Recommended repeat colonoscopy in 10 years (2019).  Reviewed information provided with the referral including office visit dated 01/27/2018 for follow-up on chronic conditions.  Noted diagnosis of IBS and GERD.  During subjective exam noted recurrent dysphasia and diarrhea.  Today she states she dpes have solid food dysphagia, typically happens daily. Had a particularly bothersome episode a couple weeks ago. Other episodes, sometimes has regurgitation; sometimes passes. Having significant GERD symptoms, taking Zegrid 1-2 times a day. If she misses a day has severe symptoms. Has been on Zegrid for 4 years. Has GERD symptoms daily. Occasional lower abdominal pain. Bowel movements have never been regular, diagnosed with IBS years ago. Has intermittent IBS symptoms. Over the past 3 months has frequent/urgent postprandial diarrhea. Having up to 6-7 stools a day but as infrequently as once daily. Typically loose stools, sometimes with mucous, sometimes watery. Typically no improvement in abdominal pain after bowel movement; pain typically occurs  with multiple trips to the bathroom; after multiple stools, the pain will subside. Denies hematochezia and melena. Denies N/V, fever, chills. unintentional weight loss. Has intermittent dizziness and balance issues, worse if she's laying down and turns her head fast. Denies chest pain, dyspnea, lightheadedness, syncope, near syncope. Denies any other upper or lower GI symptoms.  History reviewed. No pertinent past medical history.  Past Surgical History:  Procedure Laterality Date  . RF ablation of cervical nerves      Current Outpatient Medications  Medication Sig Dispense Refill  . acetaminophen (TYLENOL) 650 MG CR tablet Take 650 mg by mouth. 1-2 times per day    . enalapril-hydrochlorothiazide (VASERETIC) 10-25 MG tablet Take 1 tablet by mouth daily.  1  . FLUoxetine (PROZAC) 10 MG capsule Take 1 capsule by mouth daily.  0  . levothyroxine (SYNTHROID, LEVOTHROID) 50 MCG tablet 1 tablet daily.  2  . methylphenidate (RITALIN) 20 MG tablet Take 1 tablet by mouth 3 (three) times daily. For alertness  0  . metoprolol succinate (TOPROL-XL) 50 MG 24 hr tablet Take 2 tablets by mouth at bedtime.  10  . Multiple Vitamin (MULTIVITAMIN) tablet Take 1 tablet by mouth daily.    E[place] Bicarbonate (ZEGERID) 20-1100 MG CAPS capsule Take 1 tablet by mouth daily. occas will take twice a day     No current facility-administered medications for this visit.     Allergies as of 05/07/2018 - Review Complete 05/07/2018  Allergen Reaction Noted  . Tetracyclines & related Other (See Comments) 01/10/2011  . Aspirin  05/07/2018  . Norvasc [amlodipine besylate]  05/07/2018  . Nsaids  05/07/2018  Family History  Problem Relation Age of Onset  . Colon cancer Maternal Grandfather   . Colon cancer Paternal Grandmother     Social History   Socioeconomic History  . Marital status: Married    Spouse name: Not on file  . Number of children: Not on file  . Years of education: Not on file  .  Highest education level: Not on file  Occupational History  . Not on file  Social Needs  . Financial resource strain: Not on file  . Food insecurity:    Worry: Not on file    Inability: Not on file  . Transportation needs:    Medical: Not on file    Non-medical: Not on file  Tobacco Use  . Smoking status: Former Smoker    Types: Cigarettes  . Smokeless tobacco: Never Used  Substance and Sexual Activity  . Alcohol use: Not Currently    Frequency: Never    Comment: none in many years  . Drug use: Never  . Sexual activity: Not on file  Lifestyle  . Physical activity:    Days per week: Not on file    Minutes per session: Not on file  . Stress: Not on file  Relationships  . Social connections:    Talks on phone: Not on file    Gets together: Not on file    Attends religious service: Not on file    Active member of club or organization: Not on file    Attends meetings of clubs or organizations: Not on file    Relationship status: Not on file  . Intimate partner violence:    Fear of current or ex partner: Not on file    Emotionally abused: Not on file    Physically abused: Not on file    Forced sexual activity: Not on file  Other Topics Concern  . Not on file  Social History Narrative  . Not on file    Review of Systems: General: Negative for anorexia, weight loss, fever, chills, fatigue, weakness. Eyes: Negative for vision changes.  ENT: Negative for hoarseness, difficulty swallowing , nasal congestion. CV: Negative for chest pain, angina, palpitations, peripheral edema.  Respiratory: Negative for dyspnea at rest, cough, sputum, wheezing.  GI: See history of present illness. MS: Negative for joint pain, low back pain.  Derm: Negative for rash or itching.  Endo: Negative for unusual weight change.  Heme: Negative for bruising or bleeding. Allergy: Negative for rash or hives.    Physical Exam: BP 123/72   Pulse (!) 58   Temp (!) 97.4 F (36.3 C) (Oral)   Ht 5'  1.5" (1.562 m)   Wt 157 lb (71.2 kg)   BMI 29.18 kg/m  General:   Alert and oriented. Pleasant and cooperative. Well-nourished and well-developed.  Head:  Normocephalic and atraumatic. Eyes:  Without icterus, sclera clear and conjunctiva pink.  Ears:  Normal auditory acuity. Cardiovascular:  S1, S2 present without murmurs appreciated. Extremities without clubbing or edema. Respiratory:  Clear to auscultation bilaterally. No wheezes, rales, or rhonchi. No distress.  Gastrointestinal:  +BS, soft, non-tender and non-distended. No HSM noted. No guarding or rebound. No masses appreciated.  Rectal:  Deferred  Musculoskalatal:  Symmetrical without gross deformities. Skin:  Intact without significant lesions or rashes. Neurologic:  Alert and oriented x4;  grossly normal neurologically. Psych:  Alert and cooperative. Normal mood and affect. Heme/Lymph/Immune: No excessive bruising noted.    05/07/2018 2:23 PM   Disclaimer: This note was  dictated with voice recognition software. Similar sounding words can inadvertently be transcribed and may not be corrected upon review.

## 2018-05-08 ENCOUNTER — Other Ambulatory Visit: Payer: Self-pay | Admitting: *Deleted

## 2018-05-08 DIAGNOSIS — R1310 Dysphagia, unspecified: Secondary | ICD-10-CM

## 2018-05-08 DIAGNOSIS — Z8719 Personal history of other diseases of the digestive system: Secondary | ICD-10-CM

## 2018-05-08 DIAGNOSIS — Z6829 Body mass index (BMI) 29.0-29.9, adult: Secondary | ICD-10-CM | POA: Diagnosis not present

## 2018-05-08 DIAGNOSIS — M542 Cervicalgia: Secondary | ICD-10-CM | POA: Diagnosis not present

## 2018-05-08 DIAGNOSIS — I10 Essential (primary) hypertension: Secondary | ICD-10-CM | POA: Diagnosis not present

## 2018-05-08 DIAGNOSIS — K219 Gastro-esophageal reflux disease without esophagitis: Secondary | ICD-10-CM

## 2018-05-08 NOTE — Progress Notes (Signed)
cc'ed to pcp °

## 2018-05-12 DIAGNOSIS — K219 Gastro-esophageal reflux disease without esophagitis: Secondary | ICD-10-CM | POA: Diagnosis not present

## 2018-05-12 DIAGNOSIS — R1310 Dysphagia, unspecified: Secondary | ICD-10-CM | POA: Diagnosis not present

## 2018-05-12 DIAGNOSIS — Z8719 Personal history of other diseases of the digestive system: Secondary | ICD-10-CM | POA: Diagnosis not present

## 2018-05-15 LAB — GASTROINTESTINAL PATHOGEN PANEL PCR
C. difficile Tox A/B, PCR: NOT DETECTED
Campylobacter, PCR: NOT DETECTED
Cryptosporidium, PCR: NOT DETECTED
E coli (ETEC) LT/ST PCR: NOT DETECTED
E coli (STEC) stx1/stx2, PCR: NOT DETECTED
E coli 0157, PCR: NOT DETECTED
Giardia lamblia, PCR: NOT DETECTED
Norovirus, PCR: NOT DETECTED
Rotavirus A, PCR: NOT DETECTED
Salmonella, PCR: NOT DETECTED
Shigella, PCR: NOT DETECTED

## 2018-05-15 LAB — C. DIFFICILE GDH AND TOXIN A/B
GDH ANTIGEN: NOT DETECTED
MICRO NUMBER:: 91234799
SPECIMEN QUALITY:: ADEQUATE
TOXIN A AND B: NOT DETECTED

## 2018-05-16 ENCOUNTER — Telehealth: Payer: Self-pay

## 2018-05-16 DIAGNOSIS — Z8719 Personal history of other diseases of the digestive system: Secondary | ICD-10-CM

## 2018-05-16 DIAGNOSIS — K219 Gastro-esophageal reflux disease without esophagitis: Secondary | ICD-10-CM

## 2018-05-16 NOTE — Telephone Encounter (Signed)
EG. I signed the encounter for the lab of pt. You can see the note under labs. Pt would like diarrhea meds called into her pharmacy.

## 2018-05-20 ENCOUNTER — Telehealth: Payer: Self-pay | Admitting: Internal Medicine

## 2018-05-20 NOTE — Telephone Encounter (Signed)
Tried calling pt. Will call back.

## 2018-05-20 NOTE — Telephone Encounter (Signed)
[id number]  PLEASE CALL PATIENT, SOMETHING WAS SUPPOSED TO BE CALLED IN FOR DIARRHEA BUT IT HAS NOT BEEN CALLED IN   ALSO WANTS [place] CALLED IN BECAUSE THE SAMPLES WORKED

## 2018-05-22 MED ORDER — DICYCLOMINE HCL 10 MG PO CAPS: 10.0000 mg | ORAL_CAPSULE | Freq: Four times a day (QID) | ORAL | 1 refills | Status: DC | PRN

## 2018-05-22 MED ORDER — DEXLANSOPRAZOLE 60 MG PO CPDR: 60.0000 mg | DELAYED_RELEASE_CAPSULE | Freq: Every day | ORAL | 3 refills | Status: DC

## 2018-05-22 NOTE — Telephone Encounter (Signed)
Spoke with pt. Under lab note. Pt asked for medication for diarrhea medication and would like it sent to her pharmacy and would also like a RX for [place]. Samples are working well.

## 2018-05-22 NOTE — Telephone Encounter (Signed)
Pt.notified

## 2018-05-22 NOTE — Addendum Note (Signed)
Addended by: Gordy Levan, Jericho Alcorn A on: 05/22/2018 03:44 PM   Modules accepted: Orders

## 2018-05-22 NOTE — Telephone Encounter (Signed)
Rx for Dexilant and Bentyl sent to pharmacy.

## 2018-06-24 ENCOUNTER — Encounter (HOSPITAL_COMMUNITY): Payer: Self-pay

## 2018-06-24 ENCOUNTER — Ambulatory Visit (HOSPITAL_COMMUNITY): Admit: 2018-06-24 | Payer: Medicare Other | Admitting: Internal Medicine

## 2018-06-24 ENCOUNTER — Other Ambulatory Visit: Payer: Self-pay

## 2018-06-24 ENCOUNTER — Encounter (HOSPITAL_COMMUNITY)
Admission: RE | Disposition: A | Discharge status: Home / Self Care | Payer: Self-pay | Source: Ambulatory Visit | Attending: Internal Medicine

## 2018-06-24 ENCOUNTER — Encounter (HOSPITAL_COMMUNITY): Payer: Self-pay | Admitting: *Deleted

## 2018-06-24 ENCOUNTER — Ambulatory Visit (HOSPITAL_COMMUNITY)
Admission: RE | Admit: 2018-06-24 | Discharge: 2018-06-24 | Disposition: A | Discharge status: Home / Self Care | Payer: Medicare Other | Source: Ambulatory Visit | Attending: Internal Medicine | Admitting: Internal Medicine

## 2018-06-24 DIAGNOSIS — K219 Gastro-esophageal reflux disease without esophagitis: Secondary | ICD-10-CM | POA: Diagnosis not present

## 2018-06-24 DIAGNOSIS — Z7982 Long term (current) use of aspirin: Secondary | ICD-10-CM | POA: Insufficient documentation

## 2018-06-24 DIAGNOSIS — K529 Noninfective gastroenteritis and colitis, unspecified: Secondary | ICD-10-CM | POA: Diagnosis not present

## 2018-06-24 DIAGNOSIS — Z79899 Other long term (current) drug therapy: Secondary | ICD-10-CM | POA: Insufficient documentation

## 2018-06-24 DIAGNOSIS — Z87891 Personal history of nicotine dependence: Secondary | ICD-10-CM | POA: Insufficient documentation

## 2018-06-24 DIAGNOSIS — R1310 Dysphagia, unspecified: Secondary | ICD-10-CM | POA: Diagnosis not present

## 2018-06-24 DIAGNOSIS — K635 Polyp of colon: Secondary | ICD-10-CM | POA: Insufficient documentation

## 2018-06-24 DIAGNOSIS — Z8719 Personal history of other diseases of the digestive system: Secondary | ICD-10-CM

## 2018-06-24 DIAGNOSIS — K449 Diaphragmatic hernia without obstruction or gangrene: Secondary | ICD-10-CM | POA: Diagnosis not present

## 2018-06-24 DIAGNOSIS — Z791 Long term (current) use of non-steroidal anti-inflammatories (NSAID): Secondary | ICD-10-CM | POA: Diagnosis not present

## 2018-06-24 DIAGNOSIS — D125 Benign neoplasm of sigmoid colon: Secondary | ICD-10-CM | POA: Diagnosis not present

## 2018-06-24 DIAGNOSIS — R1314 Dysphagia, pharyngoesophageal phase: Secondary | ICD-10-CM | POA: Diagnosis not present

## 2018-06-24 HISTORY — DX: Nonrheumatic mitral (valve) prolapse: I34.1

## 2018-06-24 HISTORY — DX: Gastro-esophageal reflux disease without esophagitis: K21.9

## 2018-06-24 HISTORY — PX: POLYPECTOMY: SHX5525

## 2018-06-24 HISTORY — PX: ESOPHAGOGASTRODUODENOSCOPY: SHX5428

## 2018-06-24 HISTORY — DX: Essential (primary) hypertension: I10

## 2018-06-24 HISTORY — PX: MALONEY DILATION: SHX5535

## 2018-06-24 HISTORY — PX: COLONOSCOPY: SHX5424

## 2018-06-24 HISTORY — PX: BIOPSY: SHX5522

## 2018-06-24 HISTORY — DX: Personal history of other diseases of the musculoskeletal system and connective tissue: Z87.39

## 2018-06-24 SURGERY — COLONOSCOPY
Anesthesia: Moderate Sedation

## 2018-06-24 SURGERY — COLONOSCOPY WITH PROPOFOL
Anesthesia: Monitor Anesthesia Care

## 2018-06-24 MED ORDER — STERILE WATER FOR IRRIGATION IR SOLN
Status: DC | PRN
  Administered 2018-06-24: 1.5 mL

## 2018-06-24 MED ORDER — LIDOCAINE VISCOUS HCL 2 % MT SOLN
OROMUCOSAL | Status: DC | PRN
  Administered 2018-06-24: 1 via OROMUCOSAL

## 2018-06-24 MED ORDER — SODIUM CHLORIDE 0.9 % IV SOLN
INTRAVENOUS | Status: DC
  Administered 2018-06-24: 13:00:00 via INTRAVENOUS

## 2018-06-24 MED ORDER — LIDOCAINE VISCOUS HCL 2 % MT SOLN
OROMUCOSAL | Status: AC
  Filled 2018-06-24: qty 15

## 2018-06-24 MED ORDER — MEPERIDINE HCL 100 MG/ML IJ SOLN
INTRAMUSCULAR | Status: DC | PRN
  Administered 2018-06-24: 25 mg
  Administered 2018-06-24: 15 mg

## 2018-06-24 MED ORDER — ONDANSETRON HCL 4 MG/2ML IJ SOLN
INTRAMUSCULAR | Status: DC | PRN
  Administered 2018-06-24: 4 mg via INTRAVENOUS

## 2018-06-24 MED ORDER — ONDANSETRON HCL 4 MG/2ML IJ SOLN
INTRAMUSCULAR | Status: AC
  Filled 2018-06-24: qty 2

## 2018-06-24 MED ORDER — MEPERIDINE HCL 50 MG/ML IJ SOLN
INTRAMUSCULAR | Status: AC
  Filled 2018-06-24: qty 1

## 2018-06-24 MED ORDER — MIDAZOLAM HCL 5 MG/5ML IJ SOLN
INTRAMUSCULAR | Status: AC
  Filled 2018-06-24: qty 10

## 2018-06-24 MED ORDER — MIDAZOLAM HCL 5 MG/5ML IJ SOLN
INTRAMUSCULAR | Status: DC | PRN
  Administered 2018-06-24: 1 mg via INTRAVENOUS
  Administered 2018-06-24: 0.5 mg via INTRAVENOUS
  Administered 2018-06-24: 1 mg via INTRAVENOUS
  Administered 2018-06-24: 2 mg via INTRAVENOUS

## 2018-06-24 NOTE — Op Note (Signed)
[facility] Patient Name: Robyn Ashley Procedure Date: 06/24/2018 12:54 PM MRN: [id number] Date of Birth: [birth year] Attending MD: Norvel Richards , MD CSN: [id number] Age: 67 Admit Type: Outpatient Procedure:                Upper GI endoscopy Indications:              Dysphagia Providers:                Norvel Richards, MD, Gerome Sam, RN,                            Aram Candela Referring MD:              Medicines:                Meperidine 25 mg IV, Midazolam 2.5 mg IV,                            Ondansetron 4 mg IV Complications:            No immediate complications. Estimated Blood Loss:     Estimated blood loss was minimal. Procedure:                Pre-Anesthesia Assessment:                           - Prior to the procedure, a History and Physical                            was performed, and patient medications and                            allergies were reviewed. The patient's tolerance of                            previous anesthesia was also reviewed. The risks                            and benefits of the procedure and the sedation                            options and risks were discussed with the patient.                            All questions were answered, and informed consent                            was obtained. Prior Anticoagulants: The patient has                            taken no previous anticoagulant or antiplatelet                            agents. ASA Grade Assessment: II - A patient with  mild systemic disease. After reviewing the risks                            and benefits, the patient was deemed in                            satisfactory condition to undergo the procedure.                           After obtaining informed consent, the endoscope was                            passed under direct vision. Throughout the                            procedure, the patient's blood pressure, pulse,  and                            oxygen saturations were monitored continuously. The                            [id number]-[id number] ([id number]) scope was introduced through the                            mouth, and advanced to the second part of duodenum.                            The upper GI endoscopy was accomplished without                            difficulty. The patient tolerated the procedure                            well. Scope In: 1:24:45 PM Scope Out: 1:30:56 PM Total Procedure Duration: 0 hours 6 minutes 11 seconds  Findings:      The examined esophagus was normal.      A small hiatal hernia was present.      The exam was otherwise without abnormality.      The duodenal bulb, second portion of the duodenum and third portion of       the duodenum were normal. The scope was withdrawn. Dilation was       performed with a Maloney dilator with mild resistance at 79 Fr. The       dilation site was examined following endoscope reinsertion and showed no       change. Estimated blood loss: none. Ffinally, the duodenum was biopsied       with a cold forceps for histology. Estimated blood loss was minimal. Impression:               - Normal esophagus. Dilated.                           - Small hiatal hernia.                           -  The examination was otherwise normal.                           - Normal duodenal bulb, second portion of the                            duodenum and third portion of the duodenum.                            Biopsied. Moderate Sedation:      Moderate (conscious) sedation was administered by the endoscopy nurse       and supervised by the endoscopist. The following parameters were       monitored: oxygen saturation, heart rate, blood pressure, respiratory       rate, EKG, adequacy of pulmonary ventilation, and response to care.       Total physician intraservice time was 14 minutes. Recommendation:           - Patient has a contact number available for                             emergencies. The signs and symptoms of potential                            delayed complications were discussed with the                            patient. Return to normal activities tomorrow.                            Written discharge instructions were provided to the                            patient.                           - Resume previous diet.                           - Continue present medications.                           - Await pathology results.                           - No repeat upper endoscopy.                           - Return to GI office (date not yet determined).                            See colonoscopy report. Procedure Code(s):        --- Professional ---                           [phone], Esophagogastroduodenoscopy, flexible,  transoral; with biopsy, single or multiple                           43450, Dilation of esophagus, by unguided sound or                            bougie, single or multiple passes                           G0500, Moderate sedation services provided by the                            same physician or other qualified health care                            professional performing a gastrointestinal                            endoscopic service that sedation supports,                            requiring the presence of an independent trained                            observer to assist in the monitoring of the                            patient's level of consciousness and physiological                            status; initial 15 minutes of intra-service time;                            patient age 57 years or older (additional time may                            be reported with [phone], as appropriate) Diagnosis Code(s):        --- Professional ---                           K44.9, Diaphragmatic hernia without obstruction or                            gangrene                            R13.10, Dysphagia, unspecified CPT copyright 2018 American Medical Association. All rights reserved. The codes documented in this report are preliminary and upon coder review may  be revised to meet current compliance requirements. Cristopher Estimable. Dejohn Ibarra, MD Norvel Richards, MD 06/24/2018 1:37:08 PM This report has been signed electronically. Number of Addenda: 0

## 2018-06-24 NOTE — H&P (Signed)
_0 @   Primary Care Physician:  Redmond School, MD Primary Gastroenterologist:  Dr. Gala Romney  Pre-Procedure History & Physical: HPI:  Robyn Ashley is a 67 y.o. female here for further evaluation of chronic diarrhea and esophageal dysphagia.  Longstanding GERD.  No past medical history on file.  Past Surgical History:  Procedure Laterality Date  . RF ablation of cervical nerves      Prior to Admission medications   Medication Sig Start Date End Date Taking? Authorizing Provider  acetaminophen (TYLENOL) 650 MG CR tablet Take 1,300 mg by mouth See admin instructions. Take 1300 mg in the morning, may take a second 1300 mg dose in the evening as needed for pain   Yes [provider]  Calcium Carbonate-Vitamin D (CALCIUM 600+D PO) Take 1 tablet by mouth daily.   Yes [provider]  dexlansoprazole (DEXILANT) 60 MG capsule Take 1 capsule (60 mg total) by mouth daily. 05/22/18  Yes Carlis Stable, NP  dicyclomine (BENTYL) 10 MG capsule Take 1 capsule (10 mg total) by mouth 4 (four) times daily as needed for spasms (or abdominal pain). 05/22/18  Yes Carlis Stable, NP  enalapril-hydrochlorothiazide (VASERETIC) 10-25 MG tablet Take 1 tablet by mouth daily. 03/06/18  Yes [provider]  FLUoxetine (PROZAC) 10 MG capsule Take 10 mg by mouth daily.  04/01/18  Yes [provider]  levothyroxine (SYNTHROID, LEVOTHROID) 50 MCG tablet Take 50 mcg by mouth daily.  04/26/18  Yes [provider]  methylphenidate (RITALIN) 20 MG tablet Take 1 tablet by mouth 3 (three) times daily. For alertness 05/01/18  Yes [provider]  metoprolol succinate (TOPROL-XL) 50 MG 24 hr tablet Take 100 mg by mouth at bedtime.  04/03/18  Yes [provider]  Multiple Vitamin (MULTIVITAMIN) tablet Take 1 tablet by mouth daily.   Yes [provider]  Na Sulfate-K Sulfate-Mg Sulf 17.5-3.13-1.6 GM/177ML SOLN Take 1 kit by mouth as directed. 05/07/18  Yes Dammon Makarewicz, Cristopher Estimable, MD  phenylephrine (SUDAFED PE) 10 MG TABS tablet Take 10 mg by mouth 3 (three) times daily as needed (allergies).   Yes [provider]    Allergies as of 05/08/2018 - Review Complete 05/07/2018  Allergen Reaction Noted  . Tetracyclines & related Other (See Comments) 01/10/2011  . Aspirin  05/07/2018  . Norvasc [amlodipine besylate]  05/07/2018  . Nsaids  05/07/2018    Family History  Problem Relation Age of Onset  . Colon cancer Maternal Grandfather   . Colon cancer Paternal Grandmother     Social History   Socioeconomic History  . Marital status: Married    Spouse name: Not on file  . Number of children: Not on file  . Years of education: Not on file  . Highest education level: Not on file  Occupational History  . Not on file  Social Needs  . Financial resource strain: Not on file  . Food insecurity:    Worry: Not on file    Inability: Not on file  . Transportation needs:    Medical: Not on file    Non-medical: Not on file  Tobacco Use  . Smoking status: Former Smoker    Types: Cigarettes  . Smokeless tobacco: Never Used  Substance and Sexual Activity  . Alcohol use: Not Currently    Frequency: Never    Comment: none in many years  . Drug use: Never  . Sexual activity: Not on file  Lifestyle  . Physical activity:  Days per week: Not on file    Minutes per session: Not on file  . Stress: Not on file  Relationships  . Social connections:    Talks on phone: Not on file    Gets together: Not on file    Attends religious service: Not on file    Active member of club or organization: Not on file    Attends meetings of clubs or organizations: Not on file    Relationship status: Not on file  . Intimate partner violence:    Fear of current or ex partner: Not on file    Emotionally abused: Not on file    Physically abused: Not on file    Forced sexual activity: Not on file  Other Topics Concern  . Not on file  Social History Narrative  . Not on  file    Review of Systems: See HPI, otherwise negative ROS  Physical Exam: There were no vitals taken for this visit. General:   Alert,  Well-developed, well-nourished, pleasant and cooperative in NAD Neck:  Supple; no masses or thyromegaly. No significant cervical adenopathy. Lungs:  Clear throughout to auscultation.   No wheezes, crackles, or rhonchi. No acute distress. Heart:  Regular rate and rhythm; no murmurs, clicks, rubs,  or gallops. Abdomen: Non-distended, normal bowel sounds.  Soft and nontender without appreciable mass or hepatosplenomegaly.  Pulses:  Normal pulses noted. Extremities:  Without clubbing or edema.  Impression/Plan: Pleasant 67 year old lady with longstanding GERD now with esophageal dysphagia.  Chronic diarrhea.  GI P, C. difficile negative.  Per plan EGD with EGD and ileocolonoscopy as previously outlined.  The risks, benefits, limitations, imponderables and alternatives regarding both EGD and colonoscopy have been reviewed with the patient. Questions have been answered. All parties agreeable.      Notice: This dictation was prepared with Dragon dictation along with smaller phrase technology. Any transcriptional errors that result from this process are unintentional and may not be corrected upon review.

## 2018-06-24 NOTE — Discharge Instructions (Signed)
Colonoscopy Discharge Instructions  Read the instructions outlined below and refer to this sheet in the next few weeks. These discharge instructions provide you with general information on caring for yourself after you leave the hospital. Your doctor may also give you specific instructions. While your treatment has been planned according to the most current medical practices available, unavoidable complications occasionally occur. If you have any problems or questions after discharge, call Dr. Gala Romney at [phone]. ACTIVITY  You may resume your regular activity, but move at a slower pace for the next 24 hours.   Take frequent rest periods for the next 24 hours.   Walking will help get rid of the air and reduce the bloated feeling in your belly (abdomen).   No driving for 24 hours (because of the medicine (anesthesia) used during the test).    Do not sign any important legal documents or operate any machinery for 24 hours (because of the anesthesia used during the test).  NUTRITION  Drink plenty of fluids.   You may resume your normal diet as instructed by your doctor.   Begin with a light meal and progress to your normal diet. Heavy or fried foods are harder to digest and may make you feel sick to your stomach (nauseated).   Avoid alcoholic beverages for 24 hours or as instructed.  MEDICATIONS  You may resume your normal medications unless your doctor tells you otherwise.  WHAT YOU CAN EXPECT TODAY  Some feelings of bloating in the abdomen.   Passage of more gas than usual.   Spotting of blood in your stool or on the toilet paper.  IF YOU HAD POLYPS REMOVED DURING THE COLONOSCOPY:  No aspirin products for 7 days or as instructed.   No alcohol for 7 days or as instructed.   Eat a soft diet for the next 24 hours.  FINDING OUT THE RESULTS OF YOUR TEST Not all test results are available during your visit. If your test results are not back during the visit, make an appointment  with your caregiver to find out the results. Do not assume everything is normal if you have not heard from your caregiver or the medical facility. It is important for you to follow up on all of your test results.  SEEK IMMEDIATE MEDICAL ATTENTION IF:  You have more than a spotting of blood in your stool.   Your belly is swollen (abdominal distention).   You are nauseated or vomiting.   You have a temperature over 101.   You have abdominal pain or discomfort that is severe or gets worse throughout the day.    EGD Discharge instructions Please read the instructions outlined below and refer to this sheet in the next few weeks. These discharge instructions provide you with general information on caring for yourself after you leave the hospital. Your doctor may also give you specific instructions. While your treatment has been planned according to the most current medical practices available, unavoidable complications occasionally occur. If you have any problems or questions after discharge, please call your doctor. ACTIVITY  You may resume your regular activity but move at a slower pace for the next 24 hours.   Take frequent rest periods for the next 24 hours.   Walking will help expel (get rid of) the air and reduce the bloated feeling in your abdomen.   No driving for 24 hours (because of the anesthesia (medicine) used during the test).   You may shower.   Do not sign  any important legal documents or operate any machinery for 24 hours (because of the anesthesia used during the test).  °NUTRITION °· Drink plenty of fluids.  °· You may resume your normal diet.  °· Begin with a light meal and progress to your normal diet.  °· Avoid alcoholic beverages for 24 hours or as instructed by your caregiver.  °MEDICATIONS °· You may resume your normal medications unless your caregiver tells you otherwise.  °WHAT YOU CAN EXPECT TODAY °· You may experience abdominal discomfort such as a feeling of  fullness or “gas” pains.  °FOLLOW-UP °· Your doctor will discuss the results of your test with you.  °SEEK IMMEDIATE MEDICAL ATTENTION IF ANY OF THE FOLLOWING OCCUR: °· Excessive nausea (feeling sick to your stomach) and/or vomiting.  °· Severe abdominal pain and distention (swelling).  °· Trouble swallowing.  °· Temperature over 101° F (37.8º C).  °· Rectal bleeding or vomiting of blood.  ° ° °Further recommendations to follow pending review of pathology report ° °

## 2018-06-24 NOTE — Op Note (Signed)
[facility] Patient Name: Robyn Ashley Procedure Date: 06/24/2018 12:48 PM MRN: [id number] Date of Birth: [birth year] Attending MD: Norvel Richards , MD CSN: [id number] Age: 67 Admit Type: Outpatient Procedure:                Colonoscopy Indications:              Chronic diarrhea Providers:                Norvel Richards, MD, Gerome Sam, RN,                            Aram Candela Referring MD:              Medicines:                Midazolam 4.5 mg IV, Meperidine 40 mg IV,                            Ondansetron 4 mg IV Complications:            No immediate complications. Estimated Blood Loss:     Estimated blood loss was minimal. Procedure:                After obtaining informed consent, the colonoscope                            was passed under direct vision. Throughout the                            procedure, the patient's blood pressure, pulse, and                            oxygen saturations were monitored continuously. The                            CF-HQ190L ([id number]) scope was introduced through                            the anus and advanced to the the cecum, identified                            by appendiceal orifice and ileocecal valve. The                            colonoscopy was performed without difficulty. The                            patient tolerated the procedure well. The quality                            of the bowel preparation was adequate. Scope In: 1:38:16 PM Scope Out: 1:50:21 PM Scope Withdrawal Time: 0 hours 9 minutes 22 seconds  Total Procedure Duration: 0 hours 12 minutes 5 seconds  Findings:      The perianal and digital rectal examinations were normal.      A 5 mm polyp was found in the sigmoid colon. The polyp was  sessile. The       polyp was removed with a cold snare. Resection and retrieval were       complete. Estimated blood loss was minimal. Segmental biopsies of the       right and left colon taken to evaluate  for microscopic colitis. Distal       10 cm of terminal ileum mucosa also appeared normal.      The exam was otherwise without abnormality. Impression:               - One 5 mm polyp in the sigmoid colon, removed with                            a cold snare. Resected and retrieved.                           - The examination was otherwise normal. Status post                            segmental biopsy. Moderate Sedation:      Moderate (conscious) sedation was administered by the endoscopy nurse       and supervised by the endoscopist. The following parameters were       monitored: oxygen saturation, heart rate, blood pressure, respiratory       rate, EKG, adequacy of pulmonary ventilation, and response to care.       Total physician intraservice time was 36 minutes. Recommendation:           - Patient has a contact number available for                            emergencies. The signs and symptoms of potential                            delayed complications were discussed with the                            patient. Return to normal activities tomorrow.                            Written discharge instructions were provided to the                            patient.                           - Resume previous diet.                           - Continue present medications.                           - Await pathology results.                           - Repeat colonoscopy date to be determined after  pending pathology results are reviewed for                            surveillance.                           - Return to GI office (date not yet determined). Procedure Code(s):        --- Professional ---                           [phone], Moderate sedation services provided by the                            same physician or other qualified health care                            professional performing the diagnostic or                            therapeutic service  that the sedation supports,                            requiring the presence of an independent trained                            observer to assist in the monitoring of the                            patient's level of consciousness and physiological                            status; initial 15 minutes of intraservice time,                            patient age 21 years or older                           [phone], Moderate sedation; each additional 15                            minutes intraservice time Diagnosis Code(s):        --- Professional ---                           D12.5, Benign neoplasm of sigmoid colon                           K52.9, Noninfective gastroenteritis and colitis,                            unspecified CPT copyright 2018 American Medical Association. All rights reserved. The codes documented in this report are preliminary and upon coder review may  be revised to meet current compliance requirements. Cristopher Estimable. Evertt Chouinard, MD Norvel Richards, MD 06/24/2018 2:02:18 PM This report has been signed electronically. Number of Addenda: 0

## 2018-06-27 ENCOUNTER — Encounter: Payer: Self-pay | Admitting: Internal Medicine

## 2018-06-30 DIAGNOSIS — M47812 Spondylosis without myelopathy or radiculopathy, cervical region: Secondary | ICD-10-CM | POA: Diagnosis not present

## 2018-07-03 ENCOUNTER — Encounter (HOSPITAL_COMMUNITY): Payer: Self-pay | Admitting: Internal Medicine

## 2018-07-03 DIAGNOSIS — E663 Overweight: Secondary | ICD-10-CM | POA: Diagnosis not present

## 2018-07-03 DIAGNOSIS — E785 Hyperlipidemia, unspecified: Secondary | ICD-10-CM | POA: Diagnosis not present

## 2018-07-03 DIAGNOSIS — Z1389 Encounter for screening for other disorder: Secondary | ICD-10-CM | POA: Diagnosis not present

## 2018-07-03 DIAGNOSIS — Z6829 Body mass index (BMI) 29.0-29.9, adult: Secondary | ICD-10-CM | POA: Diagnosis not present

## 2018-07-03 DIAGNOSIS — G47419 Narcolepsy without cataplexy: Secondary | ICD-10-CM | POA: Diagnosis not present

## 2018-07-03 DIAGNOSIS — R946 Abnormal results of thyroid function studies: Secondary | ICD-10-CM | POA: Diagnosis not present

## 2018-07-03 DIAGNOSIS — I10 Essential (primary) hypertension: Secondary | ICD-10-CM | POA: Diagnosis not present

## 2018-07-03 DIAGNOSIS — E063 Autoimmune thyroiditis: Secondary | ICD-10-CM | POA: Diagnosis not present

## 2018-07-03 DIAGNOSIS — Z0001 Encounter for general adult medical examination with abnormal findings: Secondary | ICD-10-CM | POA: Diagnosis not present

## 2018-07-03 DIAGNOSIS — N393 Stress incontinence (female) (male): Secondary | ICD-10-CM | POA: Diagnosis not present

## 2018-07-03 DIAGNOSIS — Z23 Encounter for immunization: Secondary | ICD-10-CM | POA: Diagnosis not present

## 2018-07-07 DIAGNOSIS — M47812 Spondylosis without myelopathy or radiculopathy, cervical region: Secondary | ICD-10-CM | POA: Diagnosis not present

## 2018-08-18 DIAGNOSIS — I10 Essential (primary) hypertension: Secondary | ICD-10-CM | POA: Diagnosis not present

## 2018-08-18 DIAGNOSIS — M47812 Spondylosis without myelopathy or radiculopathy, cervical region: Secondary | ICD-10-CM | POA: Diagnosis not present

## 2018-08-18 DIAGNOSIS — M542 Cervicalgia: Secondary | ICD-10-CM | POA: Diagnosis not present

## 2018-08-18 DIAGNOSIS — Z6829 Body mass index (BMI) 29.0-29.9, adult: Secondary | ICD-10-CM | POA: Diagnosis not present

## 2018-08-20 ENCOUNTER — Encounter: Payer: Self-pay | Admitting: Nurse Practitioner

## 2018-08-20 ENCOUNTER — Ambulatory Visit (INDEPENDENT_AMBULATORY_CARE_PROVIDER_SITE_OTHER): Payer: Medicare Other | Admitting: Nurse Practitioner

## 2018-08-20 VITALS — BP 117/71 | HR 70 | Temp 97.0°F | Ht 62.0 in | Wt 153.8 lb

## 2018-08-20 DIAGNOSIS — Z8719 Personal history of other diseases of the digestive system: Secondary | ICD-10-CM

## 2018-08-20 DIAGNOSIS — R1310 Dysphagia, unspecified: Secondary | ICD-10-CM

## 2018-08-20 DIAGNOSIS — K219 Gastro-esophageal reflux disease without esophagitis: Secondary | ICD-10-CM

## 2018-08-20 MED ORDER — RABEPRAZOLE SODIUM 20 MG PO TBEC: 20.0000 mg | DELAYED_RELEASE_TABLET | Freq: Every day | ORAL | 3 refills | Status: DC

## 2018-08-20 NOTE — Patient Instructions (Addendum)
Your health issues we discussed today were:   Heartburn (GERD: 1. Stop taking Dexilant 2. I have sent a prescription for AcipHex to your pharmacy.  Take this once a day on an empty stomach 3. Call us in 2 to 4 weeks and let us know if it is helping  Swallowing problems (dysphagia): 1. I have ordered a swallowing study (barium pill esophagram) 2. We will call you with results when we receive them 3. Below are instructions on chewing and swallowing precautions, softer diet to help with swallowing issues 4. Call us if you have any severe or worsening symptoms  Diarrhea: 1. Now that you are off [place] which was causing worsening diarrhea, restart Bentyl 2. Call us in 2 to 4 weeks and let us know if it is helping.  If not there are other options we can try  Overall I recommend:  1. Follow-up in 4 months 2. Call us if you have any questions or concerns  At [facility] we value your feedback. You may receive a survey about your visit today. Please share your experience as we strive to create trusting relationships with our patients to provide genuine, compassionate, quality care.  We appreciate your understanding and patience as we review any laboratory studies, imaging, and other diagnostic tests that are ordered as we care for you. Our office policy is 5 business days for review of these results, and any emergent or urgent results are addressed in a timely manner for your best interest. If you do not hear from our office in 1 week, please contact us.   We also encourage the use of MyChart, which contains your medical information for your review as well. If you are not enrolled in this feature, an access code is on this after visit summary for your convenience. Thank you for allowing [place] to be involved in your care.  It was great to see you today!  I hope you have a great day!!       Dysphagia Eating Plan, Bite Size Food This diet plan is for people with moderate  swallowing problems who have transitioned from pureed and minced foods. Bite size foods are soft and cut into small chunks so that they can be swallowed safely. On this eating plan, you may be instructed to drink liquids that are thickened. Work with your health care provider and your diet and nutrition specialist (dietitian) to make sure that you are following the diet safely and getting all the nutrients you need. What are tips for following this plan? General guidelines for foods   You may eat foods that are tender, soft, and moist.  Always test food texture before taking a bite. Poke food with a fork or spoon to make sure it is tender.  Food should be easy to cut and shew. Avoid large pieces of food that require a lot of chewing.  Take small bites. Each bite should be smaller than your thumb nail (about 38mm by 15 mm).  If you were on pureed and minced food diet plans, you may eat any of the foods included in those diets.  Avoid foods that are very dry, hard, sticky, chewy, coarse, or crunchy.  If instructed by your health care provider, thicken liquids. Follow your health care provider's instructions for what products to use, how to do this, and to what thickness. ? Your health care provider may recommend using a commercial thickener, rice cereal, or potato flakes. Ask your health care provider to recommend thickeners. ?  Thickened liquids are usually a "pudding-like" consistency, or they may be as thick as honey or thick enough to eat with a spoon. Cooking  To moisten foods, you may add liquids while you are blending, mashing, or grinding your foods to the right consistency. These liquids include gravies, sauces, vegetable or fruit juice, milk, half and half, or water.  Strain extra liquid from foods before eating.  Reheat foods slowly to prevent a tough crust from forming.  Prepare foods in advance. Meal planning  Eat a variety of foods to get all the nutrients you need.  Some  foods may be tolerated better than others. Work with your dietitian to identify which foods are safest for you to eat.  Follow your meal plan as told by your dietitian. What foods are allowed? Grains Moist breads without nuts or seeds. Biscuits, muffins, pancakes, and waffles that are well-moistened with syrup, jelly, margarine, or butter. Cooked cereals. Moist bread stuffing. Moist rice. Well-moistened cold cereal with small chunks. Well-cooked pasta, noodles, rice, and bread dressing in small pieces and thick sauce. Soft dumplings or spaetzle in small pieces and butter or gravy. Vegetables Soft, well-cooked vegetables in small pieces. Soft-cooked, mashed potatoes. Thickened vegetable juice. Fruits Canned or cooked fruits that are soft or moist and do not have skin or seeds. Fresh, soft bananas. Thickened fruit juices. Meat and other protein foods Tender, moist meats or poultry in small pieces. Moist meatballs or meatloaf. Fish without bones. Eggs or egg substitutes in small pieces. Tofu. Tempeh and meat alternatives in small pieces. Well-cooked, tender beans, peas, baked beans, and other legumes. Dairy Thickened milk. Cream cheese. Yogurt. Cottage cheese. Sour cream. Small pieces of soft cheese. Fats and oils Butter. Oils. Margarine. Mayonnaise. Gravy. Spreads. Sweets and desserts Soft, smooth, moist desserts. Pudding. Custard. Moist cakes. Jam. Jelly. Honey. Preserves. Ask your health care provider whether you can have frozen desserts. Seasoning and other foods All seasonings and sweeteners. All sauces with small chunks. Prepared tuna, egg, or chicken salad without raw fruits or vegetables. Moist casseroles with small, tender pieces of meat. Soups with tender meat. What foods are not allowed? Grains Coarse or dry cereals. Dry breads. Toast. Crackers. Tough, crusty breads, such as [place] bread and baguettes. Dry pancakes, waffles, and muffins. Sticky rice. Dry bread stuffing. Granola.  Popcorn. Chips. Vegetables All raw vegetables. Cooked corn. Rubbery or stiff cooked vegetables. Stringy vegetables, such as celery. Tough, crisp fried potatoes. Potato skins. Fruits Hard, crunchy, stringy, high-pulp, and juicy raw fruits such as apples, pineapple, papaya, and watermelon. Small, round fruits, such as grapes. Dried fruit and fruit leather. Meat and other protein foods Large pieces of meat. Dry, tough meats, such as bacon, sausage, and hot dogs. Chicken, [place], or fish with skin and bones. Crunchy peanut butter. Nuts. Seeds. Nut and seed butters. Dairy Yogurt with nuts, seeds, or large chunks. Large chunks of cheese. Frozen desserts and milk consistency not allowed by your dietitian. Sweets and desserts Dry cakes. Chewy or dry cookies. Any desserts with nuts, seeds, dry fruits, coconut, pineapple, or anything dry, sticky, or hard. Chewy caramel. Licorice. Taffy-type candies. Ask your health care provider whether you can have frozen desserts. Seasoning and other foods Soups with tough or large chunks of meats, poultry, or vegetables. Corn or clam chowder. Smoothies with large chunks of fruit. Summary  Bite size foods can be helpful for people with moderate swallowing problems.  On the dysphagia eating plan, you may eat foods that are soft, moist, and cut  into pieces smaller than 83mm by 42mm.  You may be instructed to thicken liquids. Follow your health care provider's instructions about how to do this and to what consistency. This information is not intended to replace advice given to you by your health care provider. Make sure you discuss any questions you have with your health care provider. Document Released: 07/16/2005 Document Revised: 10/26/2016 Document Reviewed: 10/26/2016 Elsevier Interactive Patient Education  2019 [employer].

## 2018-08-20 NOTE — Progress Notes (Signed)
Referring Provider: Redmond School, MD Primary Care Physician:  Redmond School, MD Primary GI:  Dr. Gala Romney  Chief Complaint  Patient presents with  . Gastroesophageal Reflux    dexilant was interferring with meds so she stopped it. Zegred not helping. has been taking for 2 1/2 weeks  . Dysphagia  . Diarrhea    dexilant made worse but since coming off it has improved. Still has issues with controlling BM    HPI:   Robyn Ashley is a 68 y.o. female who presents for GERD, dysphagia, diarrhea.  Patient was last seen in our office 05/07/2018 for GERD, dysphagia, history of IBS.  At her last visit she was having solid food dysphagia and significant GERD symptoms.  She was taking Zegerid 1-2 times a day and if she misses a day she has severe symptoms.  She has been on this for 4 years.  Was continuing with daily GERD symptoms.  Chronic history of IBS with never having regular bowel movements.  Over the past 3 months she was having frequent/urgent postprandial diarrhea and up to 6 7 stools a day but as infrequently as once daily.  Stools are typically loose, sometimes with mucus, sometimes watery.  No improvement in abdominal pain after a bowel movement.  No other GI concerns.  Recommended stool studies, stop Zegerid and start Dexilant, follow-up in 1 to 2 weeks with a progress report, schedule colonoscopy and EGD with possible dilation, follow-up in 3 months.  She called our office 05/20/2018 indicating [place] work and requesting a prescription.  This was sent to her pharmacy.  Colonoscopy completed 06/24/2018 for chronic diarrhea which found a single 5 mm polyp in the sigmoid colon and otherwise normal.  Status post segmental biopsy.  Surgical pathology found the polyp to be hyperplastic and the segmental biopsies to be benign colonic mucosa.  Recommended 1 more colonoscopy for screening purposes in 10 years (age 42).  EGD completed the same day found normal esophagus status post dilation,  small hiatal hernia, otherwise normal.  Biopsies taken of the duodenal bulb, second duodenum, third duodenum.  Surgical pathology found the biopsies to be benign small bowel mucosa without evidence of celiac disease.  Recommended continue current medications.  Today she states she's doing well overall. States Dexilant made her diarrhea worse so she stopped it. Zegrid is not very effective anymore. She has previously tried Aciphex which she feels helps. Dysphagia is still a problem, tries to eat softer foods. Since stopping Dexilant, diarrhea is a bit improved. Still not a lot of control, but on very few days doesn't run to the bathroom but on other days will have 6-7 stools, typically "pasty" (consistent with [place] 5-6). Still taking Bentyl but has been out for a couple weeks. Denies other abdominal pain, N/V, fever, hematochezia, melena. Denies chest pain, dyspnea, dizziness, lightheadedness, syncope, near syncope. Denies any other upper or lower GI symptoms.  Past Medical History:  Diagnosis Date  . GERD (gastroesophageal reflux disease)   . History of degenerative disc disease   . Hypertension   . MVP (mitral valve prolapse)     Past Surgical History:  Procedure Laterality Date  . ABDOMINAL HYSTERECTOMY    . BIOPSY  06/24/2018   Procedure: BIOPSY;  Surgeon: Daneil Dolin, MD;  [facility]: [facility];  Service: Endoscopy;;  (gastric) (colon)  . COLONOSCOPY N/A 06/24/2018   Procedure: COLONOSCOPY;  Surgeon: Daneil Dolin, MD;  Location: [facility];  Service: Endoscopy;  Laterality: N/A;  1:45pm  . ESOPHAGOGASTRODUODENOSCOPY N/A 06/24/2018   Procedure: ESOPHAGOGASTRODUODENOSCOPY (EGD);  Surgeon: Daneil Dolin, MD;  Location: [facility];  Service: Endoscopy;  Laterality: N/A;  . EYE SURGERY Bilateral    rk  . MALONEY DILATION N/A 06/24/2018   Procedure: Venia Minks DILATION;  Surgeon: Daneil Dolin, MD;  Location: [facility];  Service: Endoscopy;  Laterality: N/A;  .  POLYPECTOMY  06/24/2018   Procedure: POLYPECTOMY;  Surgeon: Daneil Dolin, MD;  Location: [facility];  Service: Endoscopy;;  colon   . RF ablation of cervical nerves    . ROTATOR CUFF REPAIR Right     Current Outpatient Medications  Medication Sig Dispense Refill  . acetaminophen (TYLENOL) 650 MG CR tablet Take 1,300 mg by mouth See admin instructions. Take 1300 mg in the morning, may take a second 1300 mg dose in the evening as needed for pain    . atorvastatin (LIPITOR) 20 MG tablet Take 20 mg by mouth daily.    . Calcium Carbonate-Vitamin D (CALCIUM 600+D PO) Take 1 tablet by mouth daily.    . Coenzyme Q10 (CO Q10 PO) Take 1 tablet by mouth daily.    Marland Kitchen dicyclomine (BENTYL) 10 MG capsule Take 1 capsule (10 mg total) by mouth 4 (four) times daily as needed for spasms (or abdominal pain). 90 capsule 1  . enalapril-hydrochlorothiazide (VASERETIC) 10-25 MG tablet Take 1 tablet by mouth daily.  1  . FLUoxetine (PROZAC) 10 MG capsule Take 10 mg by mouth daily.   0  . levothyroxine (SYNTHROID, LEVOTHROID) 50 MCG tablet Take 50 mcg by mouth daily.   2  . methylphenidate (RITALIN) 20 MG tablet Take 1 tablet by mouth 3 (three) times daily. For alertness  0  . metoprolol succinate (TOPROL-XL) 50 MG 24 hr tablet Take 100 mg by mouth at bedtime.   10  . Multiple Vitamin (MULTIVITAMIN) tablet Take 1 tablet by mouth daily.    Marland Kitchen omeprazole-sodium bicarbonate (ZEGERID) 40-1100 MG capsule Take 1 capsule by mouth daily before breakfast.    . phenylephrine (SUDAFED PE) 10 MG TABS tablet Take 10 mg by mouth 3 (three) times daily as needed (allergies).     No current facility-administered medications for this visit.     Allergies as of 08/20/2018 - Review Complete 08/20/2018  Allergen Reaction Noted  . Tetracyclines & related Other (See Comments) 01/10/2011  . Aspirin  05/07/2018  . Norvasc [amlodipine besylate]  05/07/2018  . Nsaids  05/07/2018  . Dexilant [dexlansoprazole] Diarrhea 08/20/2018     Family History  Problem Relation Age of Onset  . Colon cancer Maternal Grandfather   . Colon cancer Paternal Grandmother     Social History   Socioeconomic History  . Marital status: Married    Spouse name: Not on file  . Number of children: Not on file  . Years of education: Not on file  . Highest education level: Not on file  Occupational History  . Not on file  Social Needs  . Financial resource strain: Not on file  . Food insecurity:    Worry: Not on file    Inability: Not on file  . Transportation needs:    Medical: Not on file    Non-medical: Not on file  Tobacco Use  . Smoking status: Former Smoker    Types: Cigarettes  . Smokeless tobacco: Never Used  Substance and Sexual Activity  . Alcohol use: Not Currently    Frequency: Never    Comment: none  in many years  . Drug use: Never  . Sexual activity: Not on file  Lifestyle  . Physical activity:    Days per week: Not on file    Minutes per session: Not on file  . Stress: Not on file  Relationships  . Social connections:    Talks on phone: Not on file    Gets together: Not on file    Attends religious service: Not on file    Active member of club or organization: Not on file    Attends meetings of clubs or organizations: Not on file    Relationship status: Not on file  Other Topics Concern  . Not on file  Social History Narrative  . Not on file    Review of Systems: General: Negative for anorexia, weight loss, fever, chills, fatigue, weakness. Eyes: Negative for vision changes.  ENT: Negative for hoarseness, difficulty swallowing , nasal congestion. CV: Negative for chest pain, angina, palpitations, dyspnea on exertion, peripheral edema.  Respiratory: Negative for dyspnea at rest, dyspnea on exertion, cough, sputum, wheezing.  GI: See history of present illness. GU:  Negative for dysuria, hematuria, urinary incontinence, urinary frequency, nocturnal urination.  MS: Negative for joint pain, low  back pain.  Derm: Negative for rash or itching.  Neuro: Negative for weakness, abnormal sensation, seizure, frequent headaches, memory loss, confusion.  Psych: Negative for anxiety, depression, suicidal ideation, hallucinations.  Endo: Negative for unusual weight change.  Heme: Negative for bruising or bleeding. Allergy: Negative for rash or hives.   Physical Exam: BP 117/71   Pulse 70   Temp (!) 97 F (36.1 C) (Oral)   Ht 5\' 2"  (1.575 m)   Wt 153 lb 12.8 oz (69.8 kg)   BMI 28.13 kg/m  General:   Alert and oriented. Pleasant and cooperative. Well-nourished and well-developed.  Head:  Normocephalic and atraumatic. Eyes:  Without icterus, sclera clear and conjunctiva pink.  Ears:  Normal auditory acuity. Mouth:  No deformity or lesions, oral mucosa pink.  Throat/Neck:  Supple, without mass or thyromegaly. Cardiovascular:  S1, S2 present without murmurs appreciated. Normal pulses noted. Extremities without clubbing or edema. Respiratory:  Clear to auscultation bilaterally. No wheezes, rales, or rhonchi. No distress.  Gastrointestinal:  +BS, soft, non-tender and non-distended. No HSM noted. No guarding or rebound. No masses appreciated.  Rectal:  Deferred  Musculoskalatal:  Symmetrical without gross deformities. Normal posture. Skin:  Intact without significant lesions or rashes. Neurologic:  Alert and oriented x4;  grossly normal neurologically. Psych:  Alert and cooperative. Normal mood and affect. Heme/Lymph/Immune: No significant cervical adenopathy. No excessive bruising noted.    08/20/2018 2:59 PM   Disclaimer: This note was dictated with voice recognition software. Similar sounding words can inadvertently be transcribed and may not be corrected upon review.

## 2018-08-20 NOTE — Assessment & Plan Note (Signed)
The patient has persistent GERD symptoms.  Zegerid is not very effective.  She had stopped Dexilant because of significant worsening of her diarrhea, which is a known adverse effect.  She is previously been on AcipHex and felt that worked well.  I will have her stop Dexilant and start AcipHex daily.  Prescription sent to the pharmacy.  Call with a progress report in 2 to 4 weeks.  Follow-up in 4 months.

## 2018-08-20 NOTE — Assessment & Plan Note (Signed)
History of IBS which is made worse with Dexilant and associated adverse effect of diarrhea.  Dexilant has been added to her allergy list.  Previous PPIs did not cause this.  I will start her on AcipHex, as per above.  Her diarrhea is better than it was previously but still not very good.  It is still having an adverse effect on her quality of life.  She has been out of Bentyl for 2 weeks.  I recommended that she pick up a refill of Bentyl and start this to see if this makes an improvement now that she is off Dexilant.  She is to call with a progress report in 2 to 4 weeks.  If Bentyl is not effective we can try Viberzi (I confirmed today she still has her gallbladder) or Xifaxan.  Follow-up in 4 months.

## 2018-08-20 NOTE — Assessment & Plan Note (Signed)
Persistent dysphagia with recent EGD and empiric dilation with no improvement in symptoms.  This not surprising given the EGD report indicating no significant change noticed after dilation.  No obvious stricture, web, ring.  At this point we will check a barium pill esophagram for associated functional disorders.  We will call with results.  I will provide with dysphasia 3 diet information for the time being.  Follow-up in 4 months.

## 2018-08-21 ENCOUNTER — Telehealth: Payer: Self-pay

## 2018-08-21 DIAGNOSIS — K219 Gastro-esophageal reflux disease without esophagitis: Secondary | ICD-10-CM

## 2018-08-21 NOTE — Telephone Encounter (Signed)
Received a drug change request from [facility] [address] to see if Rabeprazole is working well for pt. Preferred drugs are Omeprazole and Pantoprazole Sodium.

## 2018-08-21 NOTE — Telephone Encounter (Signed)
Pt returned call. She wants to try the Protonix and if it doesn't work, she would like to a PA done. EG please advise if this is ok.

## 2018-08-21 NOTE — Progress Notes (Signed)
CC'D TO PCP °

## 2018-08-26 ENCOUNTER — Ambulatory Visit (HOSPITAL_COMMUNITY)
Admission: RE | Admit: 2018-08-26 | Discharge: 2018-08-26 | Disposition: A | Discharge status: Home / Self Care | Payer: Medicare Other | Source: Ambulatory Visit | Attending: Nurse Practitioner | Admitting: Nurse Practitioner

## 2018-08-26 DIAGNOSIS — K219 Gastro-esophageal reflux disease without esophagitis: Secondary | ICD-10-CM | POA: Diagnosis not present

## 2018-08-26 DIAGNOSIS — K449 Diaphragmatic hernia without obstruction or gangrene: Secondary | ICD-10-CM | POA: Diagnosis not present

## 2018-08-26 DIAGNOSIS — Z8719 Personal history of other diseases of the digestive system: Secondary | ICD-10-CM | POA: Diagnosis not present

## 2018-08-26 DIAGNOSIS — R1310 Dysphagia, unspecified: Secondary | ICD-10-CM | POA: Insufficient documentation

## 2018-08-26 MED ORDER — PANTOPRAZOLE SODIUM 40 MG PO TBEC: 40.0000 mg | DELAYED_RELEASE_TABLET | Freq: Every day | ORAL | 3 refills | Status: DC

## 2018-08-26 NOTE — Telephone Encounter (Signed)
Pt came by office, informed her Protonix rx was sent to pharmacy.

## 2018-08-26 NOTE — Telephone Encounter (Signed)
Left a detailed message on pts machine. Medication was sent to pts pharmacy.

## 2018-08-26 NOTE — Telephone Encounter (Signed)
Protonix prescription sent to pharmacy.  Let us know if it does not work well enough.

## 2018-08-26 NOTE — Addendum Note (Signed)
Addended by: Gordy Levan, ERIC A on: 08/26/2018 12:05 PM   Modules accepted: Orders

## 2018-09-09 ENCOUNTER — Telehealth: Payer: Self-pay | Admitting: Internal Medicine

## 2018-09-09 NOTE — Telephone Encounter (Signed)
Pt had a swallowing test done a few weeks ago and was calling to see if the results were available. Please call [phone]

## 2018-09-10 NOTE — Telephone Encounter (Signed)
Pt is inquiring about her results from her procedure.

## 2018-09-11 ENCOUNTER — Other Ambulatory Visit: Payer: Self-pay

## 2018-09-11 DIAGNOSIS — K449 Diaphragmatic hernia without obstruction or gangrene: Secondary | ICD-10-CM

## 2018-09-11 NOTE — Telephone Encounter (Signed)
See result note.  

## 2018-09-30 ENCOUNTER — Telehealth: Payer: Self-pay | Admitting: Internal Medicine

## 2018-09-30 DIAGNOSIS — K449 Diaphragmatic hernia without obstruction or gangrene: Secondary | ICD-10-CM

## 2018-09-30 NOTE — Telephone Encounter (Signed)
Kelli from Dr Arnoldo Morale called to say that Dr Arnoldo Morale and Dr Constance Haw doesn't treat hiatal hernia and we needed to send referral either to [facility]. She said if MB needed to know where to send it to call the office this afternoon and she would ask Dr Arnoldo Morale.

## 2018-09-30 NOTE — Telephone Encounter (Signed)
Called pt, she is ok with going to [facility]. Referral sent to [facility] via [employer].

## 2018-11-03 ENCOUNTER — Other Ambulatory Visit: Payer: Self-pay | Admitting: Nurse Practitioner

## 2018-11-03 DIAGNOSIS — K219 Gastro-esophageal reflux disease without esophagitis: Secondary | ICD-10-CM

## 2018-11-03 DIAGNOSIS — Z8719 Personal history of other diseases of the digestive system: Secondary | ICD-10-CM

## 2018-11-19 ENCOUNTER — Ambulatory Visit: Payer: Self-pay | Admitting: Surgery

## 2018-11-19 DIAGNOSIS — K449 Diaphragmatic hernia without obstruction or gangrene: Secondary | ICD-10-CM | POA: Diagnosis not present

## 2018-11-19 DIAGNOSIS — K219 Gastro-esophageal reflux disease without esophagitis: Secondary | ICD-10-CM | POA: Diagnosis not present

## 2018-11-19 NOTE — H&P (Signed)
Robyn Ashley Documented: 11/19/2018 2:07 PM Location: [facility] Patient #: [id number] DOB: 10/13/1950 Married / Language: English / Race: White Female  History of Present Illness (Robyn Ashley A. 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 She has been dealing with this for many years, and while it was initially well controlled with medication, in the last year or so her symptoms have become somewhat difficult to control. She describes heartburn, chest pain, postprandial belching and early satiety. She describes a sensation that food sticks in her mid chest and occasionally in her throat. She also describes atypical cough and frequent throat clearing. She states that she sleeps stacked on pillows and is already doing frequent small meals. Her symptoms worsened significantly if she misses a dose of her antacid. The sensation of sticking and pain is regardless of whether she takes in liquid or solid foods. History of intermittent diarrhea (up to 6-7 times daily) and lower abdominal pain relieved only after bowel movements.  Medical hx: IBS, HTN, GERD, DDD, mitral valve prolapse- she was initially treated with Norvasc for this but stated it slowed everything down to much and she had stopped taking in, she reports a thorough cardiac evaluation including stress test and echo couple years ago for severe chest pain, but no cardiac etiology was found. Surgical hx: abdominal hysterectomy (transvaginal), tubal ligation, stamey bladder procedure, rotator cuff repair, has had some RF ablation for cervical neuropathic pain Social hx: denies etoh/tobacco/ drugs (former smoker)   Allergies: aspirin, norvasc, nsaids, dexilant, tetracyclines  Medications: Pantoprazole with breakthrough Zegerid, bentyl, enalapril-hctz, prozac, synthroid, Ritalin, toprol  EGD 06/24/18 Dr. Gala Ashley: - Normal  esophagus- dilated (20fr) - Small hiatal hernia. - The examination was otherwise normal. - Normal duodenal bulb, second portion of the duodenum and third portion of the duodenum. Biopsied.  Cscope done same day.   UGI 08/26/18: 1. Variable size type 1 hiatal hernia, which would intermittently dilate to moderate in size from a small size, often response to gas in the stomach, and sometimes with quick transition. The patient had an episode of gaseous reflux from the stomach while swallowing which caused nausea and a near vomiting experience with some choking sensation. She did liken this episode to her past symptomatic episodes. 2. Generally propulsive primary peristaltic waves although there was some proximal escape on most swallows. 3. No difficulty with the barium pill. 4. Aortic Atherosclerosis (ICD10-I70.0).  The patient is a 68 year old female.   Past Surgical History Robyn Ashley, [place]; 11/19/2018 2:07 PM) Colon Polyp Removal - Colonoscopy Foot Surgery Left. Hysterectomy (not due to cancer) - Partial Shoulder Surgery Right. Spinal Surgery - Neck Tonsillectomy  Diagnostic Studies History Robyn Ashley, [place]; 11/19/2018 2:07 PM) Colonoscopy within last year Mammogram 1-3 years ago Pap Smear 1-5 years ago  Allergies Robyn Ashley, Robyn Ashley; 11/19/2018 2:08 PM) Aspirin *ANALGESICS - NonNarcotic* Tetracycline *CHEMICALS* Norvasc *CALCIUM CHANNEL BLOCKERS* Allergies Reconciled  Medication History Robyn Ashley, CMA; 11/19/2018 2:10 PM) Enalapril-hydroCHLOROthiazide (10-25MG  Tablet, Oral) Active. Pantoprazole Sodium (40MG  Tablet DR, Oral) Active. Atorvastatin Calcium (20MG  Tablet, Oral) Active. Methylphenidate (8.6MG  Tab ER Disint, Oral) Active. Levothyroxine Sodium (13MCG Capsule, Oral) Active. Metoprolol Tartrate (25MG  Tablet, Oral) Active. FLUoxetine HCl (10MG  Tablet, Oral) Active. Medications Reconciled  Social History Robyn Ashley,  [place]; 11/19/2018 2:07 PM) Alcohol use Remotely quit alcohol use. Caffeine use Carbonated beverages, Coffee. No drug use Tobacco use Former smoker.  Family History Robyn Ashley, [place];  11/19/2018 2:07 PM) Arthritis Brother, Daughter, Father. Cerebrovascular Accident Father. Colon Cancer Family Members In General. Depression Daughter, Sister. Diabetes Mellitus Mother, Sister. Hypertension Brother, Daughter, Father, Mother, Sister. Ischemic Bowel Disease Mother. Kidney Disease Father. Migraine Headache Daughter, Sister. Respiratory Condition Father, Sister. Thyroid problems Sister.  Pregnancy / Birth History Robyn Ashley, [place]; 11/19/2018 2:07 PM) Age at menarche 9 years. Age of menopause [age] Gravida 4 Irregular periods Maternal age 17-20 Para 3  Other Problems Robyn Ashley, [place]; 11/19/2018 2:07 PM) Arthritis Back Pain Bladder Problems Depression Gastroesophageal Reflux Disease Heart murmur High blood pressure Hypercholesterolemia Sleep Apnea Thyroid Disease     Review of Systems (Kiylah Loyer A. Robyn Heller MD; 11/19/2018 2:44 PM) General Present- Appetite Loss and Fatigue. Not Present- Chills, Fever, Night Sweats, Weight Gain and Weight Loss. Skin Present- Dryness. Not Present- Change in Wart/Mole, Hives, Jaundice, New Lesions, Non-Healing Wounds, Rash and Ulcer. HEENT Present- Seasonal Allergies and Wears glasses/contact lenses. Not Present- Earache, Hearing Loss, Hoarseness, Nose Bleed, Oral Ulcers, Ringing in the Ears, Sinus Pain, Sore Throat, Visual Disturbances and Yellow Eyes. Respiratory Present- Snoring. Not Present- Bloody sputum, Chronic Cough, Difficulty Breathing and Wheezing. Breast Not Present- Breast Mass, Breast Pain, Nipple Discharge and Skin Changes. Cardiovascular Present- Palpitations and Rapid Heart Rate. Not Present- Chest Pain, Difficulty Breathing Lying Down, Leg Cramps, Shortness of Breath and Swelling of  Extremities. Gastrointestinal Present- Abdominal Pain, Bloating, Chronic diarrhea, Difficulty Swallowing, Excessive gas, Gets full quickly at meals and Indigestion. Not Present- Bloody Stool, Change in Bowel Habits, Constipation, Hemorrhoids, Nausea, Rectal Pain and Vomiting. Female Genitourinary Not Present- Frequency, Nocturia, Painful Urination, Pelvic Pain and Urgency. Musculoskeletal Present- Back Pain. Not Present- Joint Pain, Joint Stiffness, Muscle Pain, Muscle Weakness and Swelling of Extremities. Neurological Present- Trouble walking. Not Present- Decreased Memory, Fainting, Headaches, Numbness, Seizures, Tingling, Tremor and Weakness. Psychiatric Present- Depression. Not Present- Anxiety, Bipolar, Change in Sleep Pattern, Fearful and Frequent crying. Endocrine Present- Heat Intolerance. Not Present- Cold Intolerance, Excessive Hunger, Hair Changes, Hot flashes and New Diabetes. Hematology Present- Easy Bruising. Not Present- Blood Thinners, Excessive bleeding, Gland problems, HIV and Persistent Infections. All other systems negative  Vitals Robyn Ashley CMA; 11/19/2018 2:08 PM) 11/19/2018 2:07 PM Weight: 152.6 lb Height: 62in Body Surface Area: 1.7 m Body Mass Index: 27.91 kg/m  Temp.: 98.61F  Pulse: 66 (Regular)  BP: 130/78 (Sitting, Left Arm, Standard)      Physical Exam (Dayan Desa A. Robyn Heller MD; 11/19/2018 2:44 PM)  The physical exam findings are as follows: Note:Gen: alert and well appearing Eye: extraocular motion intact, no scleral icterus ENT: moist mucus membranes, dentition intact Neck: no mass or thyromegaly Chest: unlabored respirations, symmetrical air entry, clear bilaterally CV: regular rate and rhythm, no pedal edema Abdomen: soft, nontender, nondistended. No mass or organomegaly MSK: strength symmetrical throughout, no deformity Neuro: grossly intact, normal gait Psych: normal mood and affect, appropriate insight Skin: warm and dry, no rash or  lesion on limited exam    Assessment & Plan (Hani Campusano A. Robyn Heller MD; 11/19/2018 2:50 PM)  HIATAL HERNIA WITH GERD (K21.9) Story: We discussed the laparoscopic hiatal hernia repair and Nissen fundoplication technique using a diagram to demonstrate the relevant anatomy. Discussed risks of surgery including bleeding, infection, pain, scarring, injury to intra-abdominal or mediastinal structures, disruption or slipping of the Nissen wrap, worsening dysphagia and need for revisional surgery, hernia recurrence, gas bloat syndrome, failure to completely resolve symptoms, as well as general risks of DVT/PE, heart attack/arrhythmia, pneumonia, stroke, incisional hernia, etc. Discussed typically one in the  hospital and gradual diet advancement from pured tract regular foods over the several weeks after surgery. Discussed anticipated benefit of significantly reduced if not completely stopped necessity of antireflux medications and improved quality of life. Questions were welcomed and answered to her satisfaction. She would like to go ahead and schedule surgery.

## 2018-12-02 DIAGNOSIS — E663 Overweight: Secondary | ICD-10-CM | POA: Diagnosis not present

## 2018-12-02 DIAGNOSIS — F909 Attention-deficit hyperactivity disorder, unspecified type: Secondary | ICD-10-CM | POA: Diagnosis not present

## 2018-12-02 DIAGNOSIS — Z6829 Body mass index (BMI) 29.0-29.9, adult: Secondary | ICD-10-CM | POA: Diagnosis not present

## 2018-12-21 ENCOUNTER — Other Ambulatory Visit: Payer: Self-pay | Admitting: Nurse Practitioner

## 2018-12-21 DIAGNOSIS — K219 Gastro-esophageal reflux disease without esophagitis: Secondary | ICD-10-CM

## 2019-01-01 ENCOUNTER — Other Ambulatory Visit: Payer: Self-pay

## 2019-01-01 ENCOUNTER — Encounter: Payer: Self-pay | Admitting: Nurse Practitioner

## 2019-01-01 ENCOUNTER — Ambulatory Visit (INDEPENDENT_AMBULATORY_CARE_PROVIDER_SITE_OTHER): Payer: Medicare Other | Admitting: Nurse Practitioner

## 2019-01-01 VITALS — BP 104/59 | HR 53 | Temp 97.0°F | Ht 61.5 in | Wt 152.2 lb

## 2019-01-01 DIAGNOSIS — K219 Gastro-esophageal reflux disease without esophagitis: Secondary | ICD-10-CM

## 2019-01-01 DIAGNOSIS — Z8719 Personal history of other diseases of the digestive system: Secondary | ICD-10-CM | POA: Diagnosis not present

## 2019-01-01 NOTE — Patient Instructions (Signed)
Your health issues we discussed today were:   GERD (reflux/heartburn): 1. Keep taking your Protonix 2. Best of luck with surgery!  Diarrhea with abdominal pain: 1. I am giving you samples of Viberzi 75 mg.  Take this twice a day, WITH FOOD (on a full stomach) 2. Call us in 1 to 2 weeks and let us know if it is helping your diarrhea 3. If you have any worsening abdominal pain, nausea, vomiting, other concerning side effects stop the medicine and call our office  Overall I recommend:  1. Continue your other current medications 2. Call us if you have any questions or concerns 3. Otherwise follow-up in 3 months   Because of recent events of COVID-19 ("Coronavirus"), follow CDC recommendations:  1. Wash your hand frequently 2. Avoid touching your face 3. Stay away from people who are sick 4. If you have symptoms such as fever, cough, shortness of breath then call your healthcare provider for further guidance 5. If you are sick, STAY AT HOME unless otherwise directed by your healthcare provider. 6. Follow directions from state and national officials regarding staying safe   At [facility] we value your feedback. You may receive a survey about your visit today. Please share your experience as we strive to create trusting relationships with our patients to provide genuine, compassionate, quality care.  We appreciate your understanding and patience as we review any laboratory studies, imaging, and other diagnostic tests that are ordered as we care for you. Our office policy is 5 business days for review of these results, and any emergent or urgent results are addressed in a timely manner for your best interest. If you do not hear from our office in 1 week, please contact us.   We also encourage the use of MyChart, which contains your medical information for your review as well. If you are not enrolled in this feature, an access code is on this after visit summary for your  convenience. Thank you for allowing [place] to be involved in your care.  It was great to see you today!  I hope you have a great summer!!

## 2019-01-01 NOTE — Progress Notes (Signed)
CC'D TO PCP °

## 2019-01-01 NOTE — Assessment & Plan Note (Signed)
Persistent irritable bowel syndrome.  Her diarrhea previously had improved off Dexilant.  Now she is having frequent stools again.  She restarted Bentyl a couple days ago and has been taking it to 3 times a day and feels it is too soon of is helping.  Some days she does not do much of it and other days she does.  At this point given her decades long, essentially lifelong, history of irritable bowel syndrome diarrhea type and the fact that she still has a gallbladder, no history of pancreatitis, does not drink alcohol I will trial her on Viberzi 75 mg.  I am providing samples for 2 weeks and request a progress report in 1 to 2 weeks.  If this is ineffective we still have Bentyl that she can continue.  Otherwise return for follow-up in 3 months.

## 2019-01-01 NOTE — Assessment & Plan Note (Signed)
Persistent GERD symptoms in spite of PPI.  The patient was evaluated by [facility] surgical due to results of barium pill esophagram.  She is scheduled in the next few weeks for laparoscopic hiatal hernia repair.  This will likely have a significant impact on her GERD symptoms.  Recommend continue current medications otherwise and follow-up in 3 months.

## 2019-01-01 NOTE — Progress Notes (Signed)
Referring Provider: Redmond School, MD Primary Care Physician:  Redmond School, MD Primary GI:  Dr. Gala Romney  Chief Complaint  Patient presents with  . Gastroesophageal Reflux    still a problem  . Diarrhea    most every day and incontinent/ no warning  . Dysphagia    solid foods, soft foods and liquids    HPI:   Robyn Ashley is a 68 y.o. female who presents for follow-up on GERD and diarrhea.  The patient was last seen in our office 08/20/2018 for the same well as dysphasia.  Noted chronic history of GERD and IBS diarrhea type.  Was previously taking Zegerid.  Chronic history of IBS and has never had regular bowel movements.  Zegerid was ineffective and she was switched to Dexilant.  Colonoscopy up-to-date 06/24/2018 status post segmental biopsy and a single polyp found to be hyperplastic.  Biopsies were benign.  Recommended repeat in 10 years (2029).  EGD the same day found to be essentially normal with duodenal biopsies found to be benign.  At her last visit she noted Dexilant made her diarrhea worse.  Previously also tried AcipHex which she felt helped.  Diarrhea improved since holding Dexilant and typically has "pasty" stools 6-7 times a day still taking Bentyl but ran out for couple weeks.  No other GI complaints.  Recommended retry AcipHex, barium pill esophagram, dysphasia diet precautions, restart Bentyl and follow-up in 2 to 4 weeks with progress report.  Follow-up in 4 months.  Call from pharmacy indicates AcipHex not covered by insurance.  Recommended trial of Protonix.  No further communication about PPI.  Barium pill esophagram found variable sized type I hiatal hernia with intermittent dilation and noted gaseous reflux in the stomach or swallowing which cause nausea and near vomiting which she likened to her past symptomatic episodes.  Generally because of primary peristaltic waves although some proximal escape, no difficulty with barium pill.  Noted we could refer her to a  surgeon if she would like.  It appears the patient has a scheduled laparoscopic repair of hiatal hernia planned for 01/28/2019.  Today she states she's doing ok overall. Has surgery scheduled for lap hiatal hernia repair in July. Still with a lot of GERD despite Protonix. Still with pasty stools, "every now and then" will have looser stools; sometimes associated abdominal pain which typically resolves after a bowel movement. Started back on Bentyl when she started having some liquid stools; restarted Bentyl a couple days ago, taking it 2-3 times a day. Too soon to know if it's helping. Some days doesn't need much of it. Denies hematochezia, melena, fever, chills, unintentional weight loss. Denies URI or flu-like symptoms. Denies loss of sense of taste or smell. Denies chest pain, dyspnea, dizziness, lightheadedness, syncope, near syncope. Denies any other upper or lower GI symptoms.  She still has her gallbladder. Denies history of pancreatitis. Doesn't drink alcohol.  Past Medical History:  Diagnosis Date  . GERD (gastroesophageal reflux disease)   . History of degenerative disc disease   . Hypertension   . MVP (mitral valve prolapse)     Past Surgical History:  Procedure Laterality Date  . ABDOMINAL HYSTERECTOMY    . BIOPSY  06/24/2018   Procedure: BIOPSY;  Surgeon: Daneil Dolin, MD;  [facility];  Service: Endoscopy;;  (gastric) (colon)  . COLONOSCOPY N/A 06/24/2018   Procedure: COLONOSCOPY;  Surgeon: Daneil Dolin, MD;  Location: [facility];  Service: Endoscopy;  Laterality: N/A;  1:45pm  . ESOPHAGOGASTRODUODENOSCOPY N/A 06/24/2018   Procedure: ESOPHAGOGASTRODUODENOSCOPY (EGD);  Surgeon: Daneil Dolin, MD;  Location: [facility];  Service: Endoscopy;  Laterality: N/A;  . EYE SURGERY Bilateral    rk  . MALONEY DILATION N/A 06/24/2018   Procedure: Venia Minks DILATION;  Surgeon: Daneil Dolin, MD;  [facility]: [facility];  Service: Endoscopy;  Laterality: N/A;  .  POLYPECTOMY  06/24/2018   Procedure: POLYPECTOMY;  Surgeon: Daneil Dolin, MD;  [facility]: [facility];  Service: Endoscopy;;  colon   . RF ablation of cervical nerves    . ROTATOR CUFF REPAIR Right     Current Outpatient Medications  Medication Sig Dispense Refill  . acetaminophen (TYLENOL) 650 MG CR tablet Take 1,300 mg by mouth See admin instructions. Take 1300 mg in the morning, may take a second 1300 mg dose in the evening as needed for pain    . atorvastatin (LIPITOR) 20 MG tablet Take 20 mg by mouth daily.    . Calcium Carbonate-Vitamin D (CALCIUM 600+D PO) Take 1 tablet by mouth daily.    . Coenzyme Q10 (CO Q10 PO) Take 1 tablet by mouth daily.    Marland Kitchen dicyclomine (BENTYL) 10 MG capsule TAKE 1 CAPSULE(10 MG) BY MOUTH FOUR TIMES DAILY AS NEEDED FOR SPASMS OR ABDOMINAL PAIN 90 capsule 1  . enalapril-hydrochlorothiazide (VASERETIC) 10-25 MG tablet Take 1 tablet by mouth daily.  1  . FLUoxetine (PROZAC) 10 MG capsule Take 10 mg by mouth daily.   0  . levothyroxine (SYNTHROID, LEVOTHROID) 50 MCG tablet Take 50 mcg by mouth daily.   2  . methylphenidate (RITALIN) 20 MG tablet Take 1 tablet by mouth 3 (three) times daily. For alertness  0  . metoprolol succinate (TOPROL-XL) 50 MG 24 hr tablet Take 100 mg by mouth at bedtime.   10  . Multiple Vitamin (MULTIVITAMIN) tablet Take 1 tablet by mouth daily.    . pantoprazole (PROTONIX) 40 MG tablet TAKE 1 TABLET(40 MG) BY MOUTH DAILY 30 tablet 11  . phenylephrine (SUDAFED PE) 10 MG TABS tablet Take 10 mg by mouth 3 (three) times daily as needed (allergies).     No current facility-administered medications for this visit.     Allergies as of 01/01/2019 - Review Complete 01/01/2019  Allergen Reaction Noted  . Tetracyclines & related Other (See Comments) 01/10/2011  . Aspirin  05/07/2018  . Norvasc [amlodipine besylate]  05/07/2018  . Nsaids  05/07/2018  . Dexilant [dexlansoprazole] Diarrhea 08/20/2018    Family History  Problem Relation  Age of Onset  . Colon cancer Maternal Grandfather   . Colon cancer Paternal Grandmother     Social History   Socioeconomic History  . Marital status: Married    Spouse name: Not on file  . Number of children: Not on file  . Years of education: Not on file  . Highest education level: Not on file  Occupational History  . Not on file  Social Needs  . Financial resource strain: Not on file  . Food insecurity:    Worry: Not on file    Inability: Not on file  . Transportation needs:    Medical: Not on file    Non-medical: Not on file  Tobacco Use  . Smoking status: Former Smoker    Types: Cigarettes  . Smokeless tobacco: Never Used  Substance and Sexual Activity  . Alcohol use: Not Currently    Frequency: Never    Comment: none in many years  .  Drug use: Never  . Sexual activity: Not on file  Lifestyle  . Physical activity:    Days per week: Not on file    Minutes per session: Not on file  . Stress: Not on file  Relationships  . Social connections:    Talks on phone: Not on file    Gets together: Not on file    Attends religious service: Not on file    Active member of club or organization: Not on file    Attends meetings of clubs or organizations: Not on file    Relationship status: Not on file  Other Topics Concern  . Not on file  Social History Narrative  . Not on file    Review of Systems: General: Negative for anorexia, weight loss, fever, chills, fatigue, weakness. ENT: Negative for hoarseness, difficulty swallowing. CV: Negative for chest pain, angina, palpitations, peripheral edema.  Respiratory: Negative for dyspnea at rest, cough, sputum, wheezing.  GI: See history of present illness. Endo: Negative for unusual weight change.  Heme: Negative for bruising or bleeding. Allergy: Negative for rash or hives.   Physical Exam: BP (!) 104/59   Pulse (!) 53   Temp (!) 97 F (36.1 C) (Oral)   Ht 5' 1.5" (1.562 m)   Wt 152 lb 3.2 oz (69 kg)   BMI 28.29  kg/m  General:   Alert and oriented. Pleasant and cooperative. Well-nourished and well-developed.  Eyes:  Without icterus, sclera clear and conjunctiva pink.  Ears:  Normal auditory acuity. Cardiovascular:  S1, S2 present without murmurs appreciated. Extremities without clubbing or edema. Respiratory:  Clear to auscultation bilaterally. No wheezes, rales, or rhonchi. No distress.  Gastrointestinal:  +BS, soft, non-tender and non-distended. No HSM noted. No guarding or rebound. No masses appreciated.  Rectal:  Deferred  Musculoskalatal:  Symmetrical without gross deformities. Skin:  Intact without significant lesions or rashes. Neurologic:  Alert and oriented x4;  grossly normal neurologically. Psych:  Alert and cooperative. Normal mood and affect. Heme/Lymph/Immune: No excessive bruising noted.    01/01/2019 11:25 AM   Disclaimer: This note was dictated with voice recognition software. Similar sounding words can inadvertently be transcribed and may not be corrected upon review.

## 2019-01-02 NOTE — Progress Notes (Signed)
cc'd to pcp 

## 2019-01-12 ENCOUNTER — Telehealth: Payer: Self-pay

## 2019-01-12 DIAGNOSIS — Z8719 Personal history of other diseases of the digestive system: Secondary | ICD-10-CM

## 2019-01-12 NOTE — Telephone Encounter (Signed)
Pt called office, Viberzi samples are helping her. She would like rx sent to [facility].

## 2019-01-13 MED ORDER — VIBERZI 75 MG PO TABS: 75.0000 mg | ORAL_TABLET | Freq: Two times a day (BID) | ORAL | 5 refills | Status: DC

## 2019-01-13 NOTE — Telephone Encounter (Signed)
Called and informed pt.  

## 2019-01-13 NOTE — Telephone Encounter (Signed)
Rx sent to pharmacy. Please notify the patient. Call us for any sudden worsening abdominal pain. No more than 1 alcoholic drink per sitting/day

## 2019-01-14 ENCOUNTER — Telehealth: Payer: Self-pay | Admitting: Internal Medicine

## 2019-01-14 NOTE — Telephone Encounter (Signed)
Pt called to say that the Viberzi prescription that was called into [facility] was still too expensive even with the coupon it would cost $1500. She said that she wasn't going to have it filled and if there was something else cheaper that her insurance would cover to call it into [facility]. [phone]

## 2019-01-14 NOTE — Telephone Encounter (Signed)
Await Eric's return for further recommendations.

## 2019-01-14 NOTE — Telephone Encounter (Signed)
Noted  

## 2019-01-14 NOTE — Telephone Encounter (Signed)
The pharmacy is recommending Alsoetron HCL due to [employer] being covered under insurance. Please advise in te absence of EG.

## 2019-01-20 NOTE — Patient Instructions (Addendum)
Bobbe Medico    Your procedure is scheduled on: Wednesday 01/28/2019    Report to [facility] Main  Entrance Report to Short Stay at  06:30  AM                 YOU NEED TO HAVE A COVID 19 TEST ON_Saturday 6/27______ @_11 :30______,            THIS TEST MUST BE DONE BEFORE SURGERY, COME TO [place].         ONCE YOUR COVID TEST IS COMPLETED, PLEASE BEGIN THE QUARANTINE INSTRUCTIONS AS OUTLINED IN YOUR HANDOUT.     Call this number if you have problems the morning of surgery [phone]     Remember: Do not eat food or drink liquids :After Midnight.               BRUSH YOUR TEETH MORNING OF SURGERY AND RINSE YOUR MOUTH OUT, NO CHEWING GUM CANDY OR MINTS.     Take these medicines the morning of surgery with A SIP OF WATER:               Fluoxetine (Prozac), Levothyroxine (Synthroid), Metoprolol succinate (Toprol-XL), Pantoprazole (Protonix)                                  You may not have any metal on your body including hair pins and              piercings             Do not wear jewelry, make-up, lotions, powders or perfumes, deodorant             Do not wear nail polish.  Do not shave  48 hours prior to surgery.                Do not bring valuables to the hospital. [place].  Contacts, dentures or bridgework may not be worn into surgery.    Please read over the following fact sheets you were given:   [facility] - Preparing for Surgery  Before surgery, you can play an important role.   Because skin is not sterile, your skin needs to be as free of germs as possible.   You can reduce the number of germs on your skin by washing with CHG (chlorahexidine gluconate) soap before surgery.   CHG is an antiseptic cleaner which kills germs and bonds with the skin to continue killing germs even after washing. Please DO NOT use if you have an allergy to CHG or  antibacterial soaps.   If your skin becomes reddened/irritated stop using the CHG and inform your nurse when you arrive at Short Stay. Do not shave (including legs and underarms) for at least 48 hours prior to the first CHG shower.    Please follow these instructions carefully:   1.  Shower with CHG Soap the night before surgery and the  morning of Surgery.  2.  If you choose to wash your hair, wash your hair first as usual with your  normal  shampoo.  3.  After you shampoo, rinse your hair and body thoroughly to remove the  shampoo.  4.  Use CHG as you would any other liquid soap.  You can apply chg directly  to the skin and wash                       Gently with a scrungie or clean washcloth.  5.  Apply the CHG Soap to your body ONLY FROM THE NECK DOWN.   Do not use on face/ open                           Wound or open sores. Avoid contact with eyes, ears mouth and genitals (private parts).                       Wash face,  Genitals (private parts) with your normal soap.             6.  Wash thoroughly, paying special attention to the area where your surgery  will be performed.  7.  Thoroughly rinse your body with warm water from the neck down.  8.  DO NOT shower/wash with your normal soap after using and rinsing off  the CHG Soap.             9.  Pat yourself dry with a clean towel.            10.  Wear clean pajamas.            11.  Place clean sheets on your bed the night of your first shower and do not  sleep with pets.  Day of Surgery : Do not apply any lotions/deodorants the morning of surgery.  Please wear clean clothes to the hospital/surgery center.   FAILURE TO FOLLOW THESE INSTRUCTIONS MAY RESULT IN THE CANCELLATION OF YOUR SURGERY PATIENT SIGNATURE_________________________________  NURSE SIGNATURE__________________________________  ________________________________________________________________________

## 2019-01-21 ENCOUNTER — Other Ambulatory Visit: Payer: Self-pay

## 2019-01-21 ENCOUNTER — Ambulatory Visit (HOSPITAL_COMMUNITY)
Admission: RE | Admit: 2019-01-21 | Discharge: 2019-01-21 | Disposition: A | Discharge status: Home / Self Care | Payer: Medicare Other | Source: Ambulatory Visit | Attending: Surgery | Admitting: Surgery

## 2019-01-21 ENCOUNTER — Encounter (HOSPITAL_COMMUNITY): Payer: Self-pay

## 2019-01-21 ENCOUNTER — Encounter (HOSPITAL_COMMUNITY)
Admission: RE | Admit: 2019-01-21 | Discharge: 2019-01-21 | Disposition: A | Discharge status: Home / Self Care | Payer: Medicare Other | Source: Ambulatory Visit | Attending: Surgery | Admitting: Surgery

## 2019-01-21 DIAGNOSIS — Z01810 Encounter for preprocedural cardiovascular examination: Secondary | ICD-10-CM | POA: Insufficient documentation

## 2019-01-21 DIAGNOSIS — K449 Diaphragmatic hernia without obstruction or gangrene: Secondary | ICD-10-CM | POA: Diagnosis not present

## 2019-01-21 LAB — CBC WITH DIFFERENTIAL/PLATELET
Abs Immature Granulocytes: 0.03 10*3/uL (ref 0.00–0.07)
Basophils Absolute: 0.1 10*3/uL (ref 0.0–0.1)
Basophils Relative: 1 %
Eosinophils Absolute: 0.3 10*3/uL (ref 0.0–0.5)
Eosinophils Relative: 4 %
HCT: 38.9 % (ref 36.0–46.0)
Hemoglobin: 12.3 g/dL (ref 12.0–15.0)
Immature Granulocytes: 0 %
Lymphocytes Relative: 16 %
Lymphs Abs: 1.2 10*3/uL (ref 0.7–4.0)
MCH: 28.6 pg (ref 26.0–34.0)
MCHC: 31.6 g/dL (ref 30.0–36.0)
MCV: 90.5 fL (ref 80.0–100.0)
Monocytes Absolute: 0.7 10*3/uL (ref 0.1–1.0)
Monocytes Relative: 9 %
Neutro Abs: 5.3 10*3/uL (ref 1.7–7.7)
Neutrophils Relative %: 70 %
Platelets: 421 10*3/uL — ABNORMAL HIGH (ref 150–400)
RBC: 4.3 MIL/uL (ref 3.87–5.11)
RDW: 13.3 % (ref 11.5–15.5)
WBC: 7.7 10*3/uL (ref 4.0–10.5)
nRBC: 0 % (ref 0.0–0.2)

## 2019-01-21 LAB — COMPREHENSIVE METABOLIC PANEL
ALT: 20 U/L (ref 0–44)
AST: 24 U/L (ref 15–41)
Albumin: 4.3 g/dL (ref 3.5–5.0)
Alkaline Phosphatase: 111 U/L (ref 38–126)
Anion gap: 10 (ref 5–15)
BUN: 14 mg/dL (ref 8–23)
CO2: 31 mmol/L (ref 22–32)
Calcium: 9.3 mg/dL (ref 8.9–10.3)
Chloride: 99 mmol/L (ref 98–111)
Creatinine, Ser: 0.83 mg/dL (ref 0.44–1.00)
GFR calc Af Amer: >60 mL/min (ref >60)
GFR calc non Af Amer: >60 mL/min (ref >60)
Glucose, Bld: 98 mg/dL (ref 70–99)
Potassium: 3.7 mmol/L (ref 3.5–5.1)
Sodium: 140 mmol/L (ref 135–145)
Total Bilirubin: 0.5 mg/dL (ref 0.3–1.2)
Total Protein: 7.8 g/dL (ref 6.5–8.1)

## 2019-01-22 ENCOUNTER — Telehealth: Payer: Self-pay | Admitting: Internal Medicine

## 2019-01-22 NOTE — Telephone Encounter (Signed)
[phone] reference#[id number]     Ray from cover my meds needs to speak about a prior auth

## 2019-01-22 NOTE — Telephone Encounter (Signed)
Spoke with [employer] rep. Will contact them if need. Previous phone note was sent to provider about medication change or PA needed.

## 2019-01-24 ENCOUNTER — Other Ambulatory Visit (HOSPITAL_COMMUNITY)
Admission: RE | Admit: 2019-01-24 | Discharge: 2019-01-24 | Disposition: A | Discharge status: Home / Self Care | Payer: Medicare Other | Source: Ambulatory Visit | Attending: Surgery | Admitting: Surgery

## 2019-01-24 DIAGNOSIS — Z1159 Encounter for screening for other viral diseases: Secondary | ICD-10-CM | POA: Diagnosis not present

## 2019-01-24 LAB — SARS CORONAVIRUS 2 (TAT 6-24 HRS): SARS Coronavirus 2: NEGATIVE

## 2019-01-26 ENCOUNTER — Encounter (HOSPITAL_BASED_OUTPATIENT_CLINIC_OR_DEPARTMENT_OTHER): Payer: Self-pay | Admitting: *Deleted

## 2019-01-27 ENCOUNTER — Encounter (HOSPITAL_BASED_OUTPATIENT_CLINIC_OR_DEPARTMENT_OTHER): Payer: Self-pay | Admitting: *Deleted

## 2019-01-27 ENCOUNTER — Other Ambulatory Visit: Payer: Self-pay

## 2019-01-27 MED ORDER — BUPIVACAINE LIPOSOME 1.3 % IJ SUSP
20.0000 mL | Freq: Once | INTRAMUSCULAR | Status: DC
  Filled 2019-01-27: qty 20

## 2019-01-27 NOTE — H&P (Signed)
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 She has been dealing with this for many years, and while it was initially well controlled with medication, in the last year or so her symptoms have become somewhat difficult to control. She describes heartburn, chest pain, postprandial belching and early satiety. She describes a sensation that food sticks in her mid chest and occasionally in her throat. She also describes atypical cough and frequent throat clearing. She states that she sleeps stacked on pillows and is already doing frequent small meals. Her symptoms worsened significantly if she misses a dose of her antacid. The sensation of sticking and pain is regardless of whether she takes in liquid or solid foods. History of intermittent diarrhea (up to 6-7 times daily) and lower abdominal pain relieved only after bowel movements.  Medical hx: IBS, HTN, GERD, DDD, mitral valve prolapse- she was initially treated with Norvasc for this but stated it slowed everything down to much and she had stopped taking in, she reports a thorough cardiac evaluation including stress test and echo couple years ago for severe chest pain, but no cardiac etiology was found. Surgical hx: abdominal hysterectomy (transvaginal), tubal ligation, stamey bladder procedure, rotator cuff repair, has had some RF ablation for cervical neuropathic pain Social hx: denies etoh/tobacco/ drugs (former smoker)   Allergies: aspirin, norvasc, nsaids, dexilant, tetracyclines  Medications: Pantoprazole with breakthrough Zegerid, bentyl, enalapril-hctz, prozac, synthroid, Ritalin, toprol  EGD 06/24/18 Dr. Gala Romney: - Normal esophagus- dilated (45fr) - Small hiatal hernia. - The examination was otherwise normal. - Normal duodenal bulb, second portion of the duodenum and third portion of  the duodenum. Biopsied.  Cscope done same day.   UGI 08/26/18: 1. Variable size type 1 hiatal hernia, which would intermittently dilate to moderate in size from a small size, often response to gas in the stomach, and sometimes with quick transition. The patient had an episode of gaseous reflux from the stomach while swallowing which caused nausea and a near vomiting experience with some choking sensation. She did liken this episode to her past symptomatic episodes. 2. Generally propulsive primary peristaltic waves although there was some proximal escape on most swallows. 3. No difficulty with the barium pill. 4. Aortic Atherosclerosis (ICD10-I70.0).    Past Surgical History  Colon Polyp Removal - Colonoscopy Foot Surgery Left. Hysterectomy (not due to cancer) - Partial Shoulder Surgery Right. Spinal Surgery - Neck Tonsillectomy  Diagnostic Studies History  Colonoscopy within last year Mammogram 1-3 years ago Pap Smear 1-5 years ago  Allergies  Aspirin *ANALGESICS - NonNarcotic* Tetracycline *CHEMICALS* Norvasc *CALCIUM CHANNEL BLOCKERS* Allergies Reconciled  Medication History  Enalapril-hydroCHLOROthiazide (10-25MG  Tablet, Oral) Active. Pantoprazole Sodium (40MG  Tablet DR, Oral) Active. Atorvastatin Calcium (20MG  Tablet, Oral) Active. Methylphenidate (8.6MG  Tab ER Disint, Oral) Active. Levothyroxine Sodium (13MCG Capsule, Oral) Active. Metoprolol Tartrate (25MG  Tablet, Oral) Active. FLUoxetine HCl (10MG  Tablet, Oral) Active. Medications Reconciled  Social History Alcohol use Remotely quit alcohol use. Caffeine use Carbonated beverages, Coffee. No drug use Tobacco use Former smoker.  Family History Arthritis Brother, Daughter, Father. Cerebrovascular Accident Father. Colon Cancer Family Members In General. Depression Daughter, Sister. Diabetes Mellitus Mother, Sister. Hypertension Brother, Daughter, Father, Mother,  Sister. Ischemic Bowel Disease Mother. Kidney Disease Father. Migraine Headache Daughter, Sister. Respiratory Condition Father, Sister. Thyroid problems Sister.  Pregnancy / Birth History Age at menarche 61 years. Age of menopause 54-60 Gravida 4 Irregular  periods Maternal age 42-20 Para 3  Other Problems Arthritis Back Pain Bladder Problems Depression Gastroesophageal Reflux Disease Heart murmur High blood pressure Hypercholesterolemia Sleep Apnea Thyroid Disease     Review of Systems General Present- Appetite Loss and Fatigue. Not Present- Chills, Fever, Night Sweats, Weight Gain and Weight Loss. Skin Present- Dryness. Not Present- Change in Wart/Mole, Hives, Jaundice, New Lesions, Non-Healing Wounds, Rash and Ulcer. HEENT Present- Seasonal Allergies and Wears glasses/contact lenses. Not Present- Earache, Hearing Loss, Hoarseness, Nose Bleed, Oral Ulcers, Ringing in the Ears, Sinus Pain, Sore Throat, Visual Disturbances and Yellow Eyes. Respiratory Present- Snoring. Not Present- Bloody sputum, Chronic Cough, Difficulty Breathing and Wheezing. Breast Not Present- Breast Mass, Breast Pain, Nipple Discharge and Skin Changes. Cardiovascular Present- Palpitations and Rapid Heart Rate. Not Present- Chest Pain, Difficulty Breathing Lying Down, Leg Cramps, Shortness of Breath and Swelling of Extremities. Gastrointestinal Present- Abdominal Pain, Bloating, Chronic diarrhea, Difficulty Swallowing, Excessive gas, Gets full quickly at meals and Indigestion. Not Present- Bloody Stool, Change in Bowel Habits, Constipation, Hemorrhoids, Nausea, Rectal Pain and Vomiting. Female Genitourinary Not Present- Frequency, Nocturia, Painful Urination, Pelvic Pain and Urgency. Musculoskeletal Present- Back Pain. Not Present- Joint Pain, Joint Stiffness, Muscle Pain, Muscle Weakness and Swelling of Extremities. Neurological Present- Trouble walking. Not Present- Decreased  Memory, Fainting, Headaches, Numbness, Seizures, Tingling, Tremor and Weakness. Psychiatric Present- Depression. Not Present- Anxiety, Bipolar, Change in Sleep Pattern, Fearful and Frequent crying. Endocrine Present- Heat Intolerance. Not Present- Cold Intolerance, Excessive Hunger, Hair Changes, Hot flashes and New Diabetes. Hematology Present- Easy Bruising. Not Present- Blood Thinners, Excessive bleeding, Gland problems, HIV and Persistent Infections. All other systems negative  Vitals  Weight: 152.6 lb Height: 62in Body Surface Area: 1.7 m Body Mass Index: 27.91 kg/m  Temp.: 98.18F  Pulse: 66 (Regular)  BP: 130/78 (Sitting, Left Arm, Standard)      Physical Exam  The physical exam findings are as follows: Note:Gen: alert and well appearing Eye: extraocular motion intact, no scleral icterus ENT: moist mucus membranes, dentition intact Neck: no mass or thyromegaly Chest: unlabored respirations, symmetrical air entry, clear bilaterally CV: regular rate and rhythm, no pedal edema Abdomen: soft, nontender, nondistended. No mass or organomegaly MSK: strength symmetrical throughout, no deformity Neuro: grossly intact, normal gait Psych: normal mood and affect, appropriate insight Skin: warm and dry, no rash or lesion on limited exam    Assessment & Plan   HIATAL HERNIA WITH GERD (K21.9) Story: We discussed the laparoscopic/ robotic hiatal hernia repair and Nissen fundoplication technique using a diagram to demonstrate the relevant anatomy. Discussed risks of surgery including bleeding, infection, pain, scarring, injury to intra-abdominal or mediastinal structures, disruption or slipping of the Nissen wrap, worsening dysphagia and need for revisional surgery, hernia recurrence, gas bloat syndrome, failure to completely resolve symptoms, as well as general risks of DVT/PE, heart attack/arrhythmia, pneumonia, stroke, incisional hernia, etc. Discussed typically  one in the hospital and gradual diet advancement from pured tract regular foods over the several weeks after surgery. Discussed anticipated benefit of significantly reduced if not completely stopped necessity of antireflux medications and improved quality of life. Questions were welcomed and answered to her satisfaction. She would like to go ahead and schedule surgery.

## 2019-01-27 NOTE — Progress Notes (Signed)
Pt surgery moved from [facility] main OR to [facility].   Spoke w/ pt via phone for pre-op interview.  Npo after mn.  Arrive at [employer].  Current lab work (CBCdiff, cmp, cxr, ekg) dated 01-21-2019 in epic and chart.  Pt had negative covid test done 01-24-2019.   Will take prozac, synthroid, torpol, and protonix am dos w/ sips of water.  Pt aware she will be staying overnight at  [facility] main hospital.

## 2019-01-28 ENCOUNTER — Encounter (HOSPITAL_BASED_OUTPATIENT_CLINIC_OR_DEPARTMENT_OTHER): Payer: Self-pay | Admitting: Anesthesiology

## 2019-01-28 ENCOUNTER — Encounter (HOSPITAL_COMMUNITY)
Admission: RE | Disposition: A | Discharge status: Home / Self Care | Payer: Self-pay | Source: Home / Self Care | Attending: Surgery

## 2019-01-28 ENCOUNTER — Inpatient Hospital Stay (HOSPITAL_BASED_OUTPATIENT_CLINIC_OR_DEPARTMENT_OTHER)
Admission: RE | Admit: 2019-01-28 | Discharge: 2019-01-31 | DRG: 982 | Disposition: A | Discharge status: Home / Self Care | Payer: Medicare Other | Attending: Surgery | Admitting: Surgery

## 2019-01-28 ENCOUNTER — Ambulatory Visit (HOSPITAL_BASED_OUTPATIENT_CLINIC_OR_DEPARTMENT_OTHER): Payer: Medicare Other | Admitting: Anesthesiology

## 2019-01-28 ENCOUNTER — Ambulatory Visit (HOSPITAL_BASED_OUTPATIENT_CLINIC_OR_DEPARTMENT_OTHER): Payer: Medicare Other | Admitting: Physician Assistant

## 2019-01-28 ENCOUNTER — Other Ambulatory Visit: Payer: Self-pay

## 2019-01-28 DIAGNOSIS — K449 Diaphragmatic hernia without obstruction or gangrene: Secondary | ICD-10-CM | POA: Diagnosis present

## 2019-01-28 DIAGNOSIS — Z8719 Personal history of other diseases of the digestive system: Secondary | ICD-10-CM | POA: Diagnosis not present

## 2019-01-28 DIAGNOSIS — J918 Pleural effusion in other conditions classified elsewhere: Secondary | ICD-10-CM | POA: Diagnosis present

## 2019-01-28 DIAGNOSIS — E8779 Other fluid overload: Secondary | ICD-10-CM | POA: Diagnosis not present

## 2019-01-28 DIAGNOSIS — R609 Edema, unspecified: Secondary | ICD-10-CM

## 2019-01-28 DIAGNOSIS — K219 Gastro-esophageal reflux disease without esophagitis: Secondary | ICD-10-CM

## 2019-01-28 DIAGNOSIS — I10 Essential (primary) hypertension: Secondary | ICD-10-CM | POA: Diagnosis present

## 2019-01-28 DIAGNOSIS — R0902 Hypoxemia: Secondary | ICD-10-CM | POA: Diagnosis not present

## 2019-01-28 DIAGNOSIS — Z87891 Personal history of nicotine dependence: Secondary | ICD-10-CM

## 2019-01-28 DIAGNOSIS — E039 Hypothyroidism, unspecified: Secondary | ICD-10-CM | POA: Diagnosis not present

## 2019-01-28 DIAGNOSIS — J9811 Atelectasis: Secondary | ICD-10-CM | POA: Diagnosis present

## 2019-01-28 HISTORY — DX: Narcolepsy without cataplexy: G47.419

## 2019-01-28 HISTORY — DX: Diaphragmatic hernia without obstruction or gangrene: K44.9

## 2019-01-28 HISTORY — DX: Obstructive sleep apnea (adult) (pediatric): G47.33

## 2019-01-28 HISTORY — DX: Palpitations: R00.2

## 2019-01-28 SURGERY — FUNDOPLICATION, NISSEN, ROBOT-ASSISTED, LAPAROSCOPIC
Anesthesia: General | Site: Abdomen

## 2019-01-28 MED ORDER — SUGAMMADEX SODIUM 200 MG/2ML IV SOLN
INTRAVENOUS | Status: DC | PRN
  Administered 2019-01-28: 300 mg via INTRAVENOUS

## 2019-01-28 MED ORDER — MIDAZOLAM HCL 2 MG/2ML IJ SOLN
INTRAMUSCULAR | Status: AC
  Filled 2019-01-28: qty 2

## 2019-01-28 MED ORDER — PROPOFOL 10 MG/ML IV BOLUS
INTRAVENOUS | Status: AC
  Filled 2019-01-28: qty 40

## 2019-01-28 MED ORDER — ACETAMINOPHEN 500 MG PO TABS
ORAL_TABLET | ORAL | Status: AC
  Filled 2019-01-28: qty 2

## 2019-01-28 MED ORDER — GABAPENTIN 300 MG PO CAPS
300.0000 mg | ORAL_CAPSULE | ORAL | Status: AC
  Administered 2019-01-28: 300 mg via ORAL
  Filled 2019-01-28: qty 1

## 2019-01-28 MED ORDER — LIDOCAINE 20MG/ML (2%) 15 ML SYRINGE OPTIME
INTRAMUSCULAR | Status: DC | PRN
  Administered 2019-01-28: 1.5 mg/kg/h via INTRAVENOUS

## 2019-01-28 MED ORDER — DEXAMETHASONE SODIUM PHOSPHATE 10 MG/ML IJ SOLN
INTRAMUSCULAR | Status: DC | PRN
  Administered 2019-01-28: 10 mg via INTRAVENOUS

## 2019-01-28 MED ORDER — TRAMADOL HCL 50 MG PO TABS
50.0000 mg | ORAL_TABLET | Freq: Four times a day (QID) | ORAL | Status: DC | PRN
  Administered 2019-01-30: 50 mg via ORAL
  Filled 2019-01-28 (×2): qty 1

## 2019-01-28 MED ORDER — METOPROLOL SUCCINATE ER 50 MG PO TB24
100.0000 mg | ORAL_TABLET | Freq: Every day | ORAL | Status: DC
  Filled 2019-01-28: qty 1

## 2019-01-28 MED ORDER — FENTANYL CITRATE (PF) 100 MCG/2ML IJ SOLN
25.0000 ug | INTRAMUSCULAR | Status: DC | PRN
  Filled 2019-01-28: qty 1

## 2019-01-28 MED ORDER — DOCUSATE SODIUM 100 MG PO CAPS
100.0000 mg | ORAL_CAPSULE | Freq: Two times a day (BID) | ORAL | Status: DC
  Administered 2019-01-28 – 2019-01-30 (×3): 100 mg via ORAL
  Filled 2019-01-28 (×7): qty 1

## 2019-01-28 MED ORDER — CHLORHEXIDINE GLUCONATE 4 % EX LIQD
60.0000 mL | Freq: Once | CUTANEOUS | Status: DC
  Filled 2019-01-28: qty 118

## 2019-01-28 MED ORDER — SODIUM CHLORIDE 0.9 % IV SOLN
INTRAVENOUS | Status: DC
  Administered 2019-01-28: 15:00:00 via INTRAVENOUS
  Filled 2019-01-28: qty 1000

## 2019-01-28 MED ORDER — METOPROLOL SUCCINATE ER 25 MG PO TB24
25.0000 mg | ORAL_TABLET | Freq: Every day | ORAL | Status: DC
  Administered 2019-01-29 – 2019-01-30 (×2): 25 mg via ORAL
  Filled 2019-01-28 (×2): qty 1

## 2019-01-28 MED ORDER — HYDROCHLOROTHIAZIDE 25 MG PO TABS
25.0000 mg | ORAL_TABLET | Freq: Every day | ORAL | Status: DC
  Filled 2019-01-28: qty 1

## 2019-01-28 MED ORDER — ENALAPRIL-HYDROCHLOROTHIAZIDE 10-25 MG PO TABS: 1.0000 | ORAL_TABLET | Freq: Every day | ORAL | Status: DC

## 2019-01-28 MED ORDER — LEVOTHYROXINE SODIUM 50 MCG PO TABS
50.0000 ug | ORAL_TABLET | Freq: Every day | ORAL | Status: DC
  Filled 2019-01-28: qty 1

## 2019-01-28 MED ORDER — LIDOCAINE 2% (20 MG/ML) 5 ML SYRINGE
INTRAMUSCULAR | Status: DC | PRN
  Administered 2019-01-28: 60 mg via INTRAVENOUS

## 2019-01-28 MED ORDER — FLUOXETINE HCL 10 MG PO CAPS
10.0000 mg | ORAL_CAPSULE | Freq: Every day | ORAL | Status: DC
  Administered 2019-01-29 – 2019-01-31 (×3): 10 mg via ORAL
  Filled 2019-01-28 (×3): qty 1

## 2019-01-28 MED ORDER — GABAPENTIN 300 MG PO CAPS
300.0000 mg | ORAL_CAPSULE | Freq: Three times a day (TID) | ORAL | Status: DC
  Administered 2019-01-28 (×2): 300 mg via ORAL
  Filled 2019-01-28 (×3): qty 1

## 2019-01-28 MED ORDER — PROPOFOL 10 MG/ML IV BOLUS
INTRAVENOUS | Status: AC
  Filled 2019-01-28: qty 20

## 2019-01-28 MED ORDER — ONDANSETRON 4 MG PO TBDP
4.0000 mg | ORAL_TABLET | Freq: Four times a day (QID) | ORAL | Status: DC | PRN
  Filled 2019-01-28: qty 1

## 2019-01-28 MED ORDER — ENALAPRIL MALEATE 10 MG PO TABS
10.0000 mg | ORAL_TABLET | Freq: Every day | ORAL | Status: DC
  Filled 2019-01-28: qty 1

## 2019-01-28 MED ORDER — GABAPENTIN 300 MG PO CAPS
ORAL_CAPSULE | ORAL | Status: AC
  Filled 2019-01-28: qty 1

## 2019-01-28 MED ORDER — ENOXAPARIN SODIUM 40 MG/0.4ML ~~LOC~~ SOLN
40.0000 mg | SUBCUTANEOUS | Status: DC
  Administered 2019-01-29 – 2019-01-30 (×2): 40 mg via SUBCUTANEOUS
  Filled 2019-01-28 (×4): qty 0.4

## 2019-01-28 MED ORDER — ONDANSETRON HCL 4 MG/2ML IJ SOLN
4.0000 mg | Freq: Four times a day (QID) | INTRAMUSCULAR | Status: DC | PRN
  Filled 2019-01-28: qty 2

## 2019-01-28 MED ORDER — DIPHENHYDRAMINE HCL 50 MG/ML IJ SOLN
12.5000 mg | Freq: Four times a day (QID) | INTRAMUSCULAR | Status: DC | PRN
  Filled 2019-01-28: qty 0.25

## 2019-01-28 MED ORDER — 0.9 % SODIUM CHLORIDE (POUR BTL) OPTIME
TOPICAL | Status: DC | PRN
  Administered 2019-01-28: 1000 mL

## 2019-01-28 MED ORDER — FENTANYL CITRATE (PF) 100 MCG/2ML IJ SOLN
INTRAMUSCULAR | Status: DC | PRN
  Administered 2019-01-28 (×2): 100 ug via INTRAVENOUS

## 2019-01-28 MED ORDER — SODIUM CHLORIDE 0.9 % IV SOLN
INTRAVENOUS | Status: DC | PRN
  Administered 2019-01-28 (×2): 100 ug/min via INTRAVENOUS

## 2019-01-28 MED ORDER — SUGAMMADEX SODIUM 200 MG/2ML IV SOLN
INTRAVENOUS | Status: AC
  Filled 2019-01-28: qty 2

## 2019-01-28 MED ORDER — BISACODYL 10 MG RE SUPP
10.0000 mg | Freq: Every day | RECTAL | Status: DC | PRN
  Filled 2019-01-28: qty 1

## 2019-01-28 MED ORDER — BUPIVACAINE LIPOSOME 1.3 % IJ SUSP
INTRAMUSCULAR | Status: AC
  Filled 2019-01-28: qty 20

## 2019-01-28 MED ORDER — PROPOFOL 10 MG/ML IV BOLUS
INTRAVENOUS | Status: DC | PRN
  Administered 2019-01-28: 160 mg via INTRAVENOUS
  Administered 2019-01-28: 40 mg via INTRAVENOUS

## 2019-01-28 MED ORDER — KETAMINE HCL 10 MG/ML IJ SOLN
INTRAMUSCULAR | Status: AC
  Filled 2019-01-28: qty 1

## 2019-01-28 MED ORDER — FLUOXETINE HCL 10 MG PO CAPS
10.0000 mg | ORAL_CAPSULE | Freq: Every day | ORAL | Status: DC
  Filled 2019-01-28: qty 1

## 2019-01-28 MED ORDER — SIMETHICONE 80 MG PO CHEW
40.0000 mg | CHEWABLE_TABLET | Freq: Four times a day (QID) | ORAL | Status: DC | PRN
  Filled 2019-01-28: qty 1

## 2019-01-28 MED ORDER — ROCURONIUM BROMIDE 10 MG/ML (PF) SYRINGE
PREFILLED_SYRINGE | INTRAVENOUS | Status: DC | PRN
  Administered 2019-01-28: 10 mg via INTRAVENOUS
  Administered 2019-01-28: 50 mg via INTRAVENOUS

## 2019-01-28 MED ORDER — LEVOTHYROXINE SODIUM 50 MCG PO TABS
50.0000 ug | ORAL_TABLET | Freq: Every day | ORAL | Status: DC
  Administered 2019-01-29 – 2019-01-31 (×3): 50 ug via ORAL
  Filled 2019-01-28 (×3): qty 1

## 2019-01-28 MED ORDER — SUGAMMADEX SODIUM 200 MG/2ML IV SOLN
INTRAVENOUS | Status: AC
  Filled 2019-01-28: qty 10

## 2019-01-28 MED ORDER — SUGAMMADEX SODIUM 500 MG/5ML IV SOLN
INTRAVENOUS | Status: AC
  Filled 2019-01-28: qty 10

## 2019-01-28 MED ORDER — PANTOPRAZOLE SODIUM 40 MG IV SOLR
40.0000 mg | Freq: Every day | INTRAVENOUS | Status: DC
  Administered 2019-01-28 – 2019-01-30 (×3): 40 mg via INTRAVENOUS
  Filled 2019-01-28 (×4): qty 40

## 2019-01-28 MED ORDER — ACETAMINOPHEN 500 MG PO TABS
1000.0000 mg | ORAL_TABLET | ORAL | Status: AC
  Administered 2019-01-28: 1000 mg via ORAL
  Filled 2019-01-28: qty 2

## 2019-01-28 MED ORDER — CEFAZOLIN SODIUM-DEXTROSE 2-4 GM/100ML-% IV SOLN
INTRAVENOUS | Status: AC
  Filled 2019-01-28: qty 100

## 2019-01-28 MED ORDER — HYDROMORPHONE HCL 1 MG/ML IJ SOLN
0.5000 mg | INTRAMUSCULAR | Status: DC | PRN
  Filled 2019-01-28: qty 0.5

## 2019-01-28 MED ORDER — ACETAMINOPHEN 500 MG PO TABS
1000.0000 mg | ORAL_TABLET | Freq: Four times a day (QID) | ORAL | Status: DC
  Administered 2019-01-28 – 2019-01-31 (×10): 1000 mg via ORAL
  Filled 2019-01-28 (×12): qty 2

## 2019-01-28 MED ORDER — NALOXONE HCL 0.4 MG/ML IJ SOLN
INTRAMUSCULAR | Status: AC
  Filled 2019-01-28: qty 1

## 2019-01-28 MED ORDER — LACTATED RINGERS IV SOLN
INTRAVENOUS | Status: DC
  Administered 2019-01-28: 10:00:00 via INTRAVENOUS
  Administered 2019-01-28: 1000 mL via INTRAVENOUS
  Filled 2019-01-28: qty 1000

## 2019-01-28 MED ORDER — BUPIVACAINE-EPINEPHRINE (PF) 0.25% -1:200000 IJ SOLN
INTRAMUSCULAR | Status: AC
  Filled 2019-01-28: qty 30

## 2019-01-28 MED ORDER — CEFAZOLIN SODIUM-DEXTROSE 2-4 GM/100ML-% IV SOLN
2.0000 g | INTRAVENOUS | Status: AC
  Administered 2019-01-28: 2 g via INTRAVENOUS
  Filled 2019-01-28: qty 100

## 2019-01-28 MED ORDER — LACTATED RINGERS IR SOLN
Status: DC | PRN
  Administered 2019-01-28: 1000 mL

## 2019-01-28 MED ORDER — KETAMINE HCL 10 MG/ML IJ SOLN
INTRAMUSCULAR | Status: DC | PRN
  Administered 2019-01-28: 10 mg via INTRAVENOUS
  Administered 2019-01-28: 25 mg via INTRAVENOUS

## 2019-01-28 MED ORDER — EPHEDRINE SULFATE 50 MG/ML IJ SOLN
INTRAMUSCULAR | Status: DC | PRN
  Administered 2019-01-28 (×2): 10 mg via INTRAVENOUS

## 2019-01-28 MED ORDER — KETOROLAC TROMETHAMINE 15 MG/ML IJ SOLN
15.0000 mg | Freq: Four times a day (QID) | INTRAMUSCULAR | Status: DC | PRN
  Filled 2019-01-28: qty 1

## 2019-01-28 MED ORDER — LIDOCAINE HCL 2 % IJ SOLN
INTRAMUSCULAR | Status: AC
  Filled 2019-01-28: qty 20

## 2019-01-28 MED ORDER — FENTANYL CITRATE (PF) 250 MCG/5ML IJ SOLN
INTRAMUSCULAR | Status: AC
  Filled 2019-01-28: qty 5

## 2019-01-28 MED ORDER — BUPIVACAINE-EPINEPHRINE (PF) 0.25% -1:200000 IJ SOLN
INTRAMUSCULAR | Status: DC | PRN
  Administered 2019-01-28: 30 mL

## 2019-01-28 MED ORDER — METOCLOPRAMIDE HCL 5 MG/ML IJ SOLN
10.0000 mg | Freq: Four times a day (QID) | INTRAMUSCULAR | Status: DC
  Administered 2019-01-28 – 2019-01-30 (×8): 10 mg via INTRAVENOUS
  Filled 2019-01-28 (×10): qty 2

## 2019-01-28 MED ORDER — DIPHENHYDRAMINE HCL 12.5 MG/5ML PO ELIX
12.5000 mg | ORAL_SOLUTION | Freq: Four times a day (QID) | ORAL | Status: DC | PRN
  Filled 2019-01-28: qty 5

## 2019-01-28 MED ORDER — SUCCINYLCHOLINE CHLORIDE 200 MG/10ML IV SOSY
PREFILLED_SYRINGE | INTRAVENOUS | Status: DC | PRN
  Administered 2019-01-28: 160 mg via INTRAVENOUS

## 2019-01-28 MED ORDER — METHOCARBAMOL 1000 MG/10ML IJ SOLN
500.0000 mg | Freq: Four times a day (QID) | INTRAVENOUS | Status: DC | PRN
  Filled 2019-01-28: qty 5

## 2019-01-28 MED ORDER — HYDRALAZINE HCL 20 MG/ML IJ SOLN
10.0000 mg | INTRAMUSCULAR | Status: DC | PRN
  Filled 2019-01-28: qty 0.5

## 2019-01-28 MED ORDER — ONDANSETRON HCL 4 MG/2ML IJ SOLN
INTRAMUSCULAR | Status: DC | PRN
  Administered 2019-01-28: 4 mg via INTRAVENOUS

## 2019-01-28 MED ORDER — GLYCOPYRROLATE 0.2 MG/ML IJ SOLN
INTRAMUSCULAR | Status: DC | PRN
  Administered 2019-01-28: 0.2 mg via INTRAVENOUS

## 2019-01-28 MED ORDER — DICYCLOMINE HCL 10 MG PO CAPS
10.0000 mg | ORAL_CAPSULE | Freq: Four times a day (QID) | ORAL | Status: DC | PRN
  Filled 2019-01-28: qty 1

## 2019-01-28 MED ORDER — MIDAZOLAM HCL 2 MG/2ML IJ SOLN
INTRAMUSCULAR | Status: DC | PRN
  Administered 2019-01-28: 2 mg via INTRAVENOUS

## 2019-01-28 MED ORDER — BUPIVACAINE LIPOSOME 1.3 % IJ SUSP
INTRAMUSCULAR | Status: DC | PRN
  Administered 2019-01-28: 20 mL

## 2019-01-28 MED ORDER — NALOXONE HCL 0.4 MG/ML IJ SOLN
INTRAMUSCULAR | Status: DC | PRN
  Administered 2019-01-28 (×2): 100 ug via INTRAVENOUS

## 2019-01-28 SURGICAL SUPPLY — 46 items
ADH SKN CLS APL DERMABOND .7 (GAUZE/BANDAGES/DRESSINGS) ×1
APL PRP STRL LF DISP 70% ISPRP (MISCELLANEOUS) ×1
APPLIER CLIP 5 13 M/L LIGAMAX5 (MISCELLANEOUS)
APR CLP MED LRG 5 ANG JAW (MISCELLANEOUS)
BLADE SURG SZ11 CARB STEEL (BLADE) ×2 IMPLANT
CHLORAPREP W/TINT 26 (MISCELLANEOUS) ×1 IMPLANT
CLIP APPLIE 5 13 M/L LIGAMAX5 (MISCELLANEOUS) IMPLANT
COVER SURGICAL LIGHT HANDLE (MISCELLANEOUS) ×1 IMPLANT
COVER WAND RF STERILE (DRAPES) ×1 IMPLANT
DECANTER SPIKE VIAL GLASS SM (MISCELLANEOUS) ×1 IMPLANT
DERMABOND ADVANCED (GAUZE/BANDAGES/DRESSINGS) ×1
DERMABOND ADVANCED .7 DNX12 (GAUZE/BANDAGES/DRESSINGS) IMPLANT
DRAIN PENROSE 18X1/2 LTX STRL (DRAIN) ×2 IMPLANT
DRAPE ARM DVNC X/XI (DISPOSABLE) IMPLANT
DRAPE COLUMN DVNC XI (DISPOSABLE) IMPLANT
DRAPE DA VINCI XI ARM (DISPOSABLE) ×3
DRAPE DA VINCI XI COLUMN (DISPOSABLE) ×1
ENDOLOOP SUT PDS II  0 18 (SUTURE)
ENDOLOOP SUT PDS II 0 18 (SUTURE) IMPLANT
ETHIBOND 2 0 GREEN CT 2 30IN (SUTURE) ×3 IMPLANT
GAUZE SPONGE 4X4 12PLY STRL LF (GAUZE/BANDAGES/DRESSINGS) ×1 IMPLANT
GLOVE BIO SURGEON STRL SZ7.5 (GLOVE) ×4 IMPLANT
GOWN STRL REUS W/TWL XL LVL3 (GOWN DISPOSABLE) ×3 IMPLANT
KIT TURNOVER KIT A (KITS) ×1 IMPLANT
MARKER SKIN DUAL TIP RULER LAB (MISCELLANEOUS) ×1 IMPLANT
NDL INSUFFLATION 14GA 120MM (NEEDLE) IMPLANT
NEEDLE HYPO 22GX1.5 SAFETY (NEEDLE) ×1 IMPLANT
NEEDLE INSUFFLATION 14GA 120MM (NEEDLE) ×2 IMPLANT
PACK CARDIOVASCULAR III (CUSTOM PROCEDURE TRAY) ×2 IMPLANT
PACK LAPAROSCOPY BASIN (CUSTOM PROCEDURE TRAY) ×1 IMPLANT
PAD POSITIONING PINK XL (MISCELLANEOUS) ×1 IMPLANT
SCISSORS LAP 5X35 DISP (ENDOMECHANICALS) ×1 IMPLANT
SEAL CANN UNIV 5-8 DVNC XI (MISCELLANEOUS) IMPLANT
SEAL XI 5MM-8MM UNIVERSAL (MISCELLANEOUS) ×4
SEALER VESSEL DA VINCI XI (MISCELLANEOUS) ×1
SEALER VESSEL EXT DVNC XI (MISCELLANEOUS) IMPLANT
SET IRRIG TUBING LAPAROSCOPIC (IRRIGATION / IRRIGATOR) ×1 IMPLANT
SOLUTION ANTI FOG 6CC (MISCELLANEOUS) ×1 IMPLANT
SOLUTION ELECTROLUBE (MISCELLANEOUS) ×1 IMPLANT
SUT MNCRL AB 4-0 PS2 18 (SUTURE) ×1 IMPLANT
SYRINGE 20CC LL (MISCELLANEOUS) ×1 IMPLANT
TOWEL OR 17X26 10 PK STRL BLUE (TOWEL DISPOSABLE) ×1 IMPLANT
TRAY FOLEY CATH 14FRSI W/METER (CATHETERS) ×1 IMPLANT
TROCAR ADV FIXATION 5X100MM (TROCAR) ×1 IMPLANT
TUBING INSUFFLATION 10FT LAP (TUBING) ×1 IMPLANT
ethicond suture 0 ×1 IMPLANT

## 2019-01-28 NOTE — Anesthesia Procedure Notes (Signed)
Procedure Name: Intubation Date/Time: 01/28/2019 8:40 AM Performed by: Wanita Chamberlain, CRNA Pre-anesthesia Checklist: Patient identified, Emergency Drugs available, Suction available and Patient being monitored Patient Re-evaluated:Patient Re-evaluated prior to induction Oxygen Delivery Method: Circle system utilized Preoxygenation: Pre-oxygenation with 100% oxygen Induction Type: IV induction Ventilation: Mask ventilation without difficulty Laryngoscope Size: Mac and 3 Grade View: Grade I Tube type: Oral

## 2019-01-28 NOTE — Op Note (Signed)
Operative Note  Robyn Ashley  [id number]  [id number]  01/28/2019   Surgeon: Victorino Sparrow ConnorFACS  Assistant: Ralene Ok MD FACS  Procedure performed: Robotic hiatal hernia repair, Nissen Fundoplication  Preop diagnosis: hiatal hernia, GERD Post-op diagnosis/intraop findings: same  Specimens: none Retained items: none EBL: minimal cc Complications: none  Description of procedure: After obtaining informed consent the patient was taken to the operating room and placed supine on operating room table wheregeneral endotracheal anesthesia was initiated, preoperative antibiotics were administered, SCDs applied, and a formal timeout was performed. The abdomen was prepped and draped in the usual sterile fashion. The peritoneal cavity was entered with a veress needle just above and to the left of the umbilicus and insufflated to 84mmHg. An 81mm trocar and camera were then placed. Gross inspection revealed no evidence of injury from entry. Bilateral TAPS blocks were performed under laparoscopic visualization using exparel.  Under direct visualization, 2 left-sided and 1 right paramedian 8 mm trocar were inserted as well as a right lateral 5 mm trocar for the assistant. A subxiphoid incision was made and the liver retractor introduced for fixed retraction of the left lobe. The patient was then placed in steep reverse Trendelenburg and we began our dissection by taking down the short gastrics using the Harmonic scalpel. This was carried all the way to the left crus which was then cleared off. I proceeded to divide the sac from the crura proceeding anteriorly using the harmonic scalpel and blunt dissection. Then turned to the right side where the pars lucida was entered using the Harmonic scalpel. The sac was divided away from the right crus and proceeded anteriorly until we connected to the other side dissection and the sac, which was very small, was reduced into the abdomen where the stomach and  distal esophagus rest now without tension. I completed the circumferential dissection of the distal esophagus by dividing some strands of the sac posteriorly taking care to preserve the esophagus and vagus. The mediastinal attachments of the esophagus were dissected bluntly and with cautery where indicated in order to improve our intra-abdominal esophageal length.  We were able to accomplish about 5 cm of tension-free intra-abdominal esophageal length. At this point the crura were reapproximated with interrupted 0 Ethibonds. A total of 5 sutures were used. This narrowed the hiatus nicely to about 2.5 cm in diameter.  At this point the anesthetist attempted to insert the 66 French lighted bougie, but this resistance in the pharynx/proximal esophagus and it was unable to be passed. The fundus of the stomach was grasped from the right side and pulled to wrap around the distal esophagus.  The shoeshine maneuver was performed confirming no twisting fluid membrane or restriction.  The wrap was completed with 3 interrupted sutures of 0 Ethibond, the superior suture grabbing a bite of esophageal wall. The final wrap was about 2 cm in length and easily accommodated graspers on either side.  The wrap sits in the stomach without any tension, with the suture line oriented anteriorly. The abdomen was then inspected for hemostasis which was found to be adequate. No evidence of other injuries. The liver retractor was removed under direct visualization. The abdomen was then desufflated and all trochars removed. Our skin incisions were closed with running subcuticular Monocryl after infiltrating with the remaining local. Dermabond was then applied. The patient was then awakened, extubated and taken to PACU in stable condition.   All counts were correct at the completion of the case.

## 2019-01-28 NOTE — Anesthesia Postprocedure Evaluation (Signed)
Anesthesia Post Note  Patient: ARBADELLA KIMBLER  Procedure(s) Performed: XI ROBOTIC ASSISTED LAPAROSCOPIC NISSEN FUNDOPLICATION and REPAIR OF HIATAL HERNIA (N/A Abdomen)     Patient location during evaluation: PACU Anesthesia Type: General Level of consciousness: awake and alert Pain management: pain level controlled Vital Signs Assessment: post-procedure vital signs reviewed and stable Respiratory status: spontaneous breathing, nonlabored ventilation, respiratory function stable and patient connected to nasal cannula oxygen Cardiovascular status: blood pressure returned to baseline and stable Postop Assessment: no apparent nausea or vomiting Anesthetic complications: no    Last Vitals:  Vitals:   01/28/19 1145 01/28/19 1200  BP: (!) 155/82 (!) 146/78  Pulse: 81 77  Resp: 15 14  Temp:    SpO2: 98% 98%    Last Pain:  Vitals:   01/28/19 1140  TempSrc:   PainSc: Asleep                 Wendle Kina L Belle Charlie

## 2019-01-28 NOTE — Transfer of Care (Signed)
Immediate Anesthesia Transfer of Care Note  Patient: Robyn Ashley  Procedure(s) Performed: XI ROBOTIC ASSISTED LAPAROSCOPIC NISSEN FUNDOPLICATION and REPAIR OF HIATAL HERNIA (N/A Abdomen)  Patient Location: PACU  Anesthesia Type:General  Level of Consciousness: sedated and patient cooperative  Airway & Oxygen Therapy: Patient Spontanous Breathing and Patient connected to face mask oxygen  Post-op Assessment: Report given to RN and Post -op Vital signs reviewed and stable  Post vital signs: Reviewed and stable  Last Vitals:  Vitals Value Taken Time  BP    Temp    Pulse 82 01/28/19 1141  Resp    SpO2 99 % 01/28/19 1141  Vitals shown include unvalidated device data.  Last Pain:  Vitals:   01/28/19 0702  TempSrc:   PainSc: 0-No pain      Patients Stated Pain Goal: 5 (56/43/32 [id number])  Complications: No apparent anesthesia complications

## 2019-01-28 NOTE — Anesthesia Preprocedure Evaluation (Addendum)
Anesthesia Evaluation  Patient identified by MRN, date of birth, ID band Patient awake    Reviewed: Allergy & Precautions, NPO status , Patient's Chart, lab work & pertinent test results, reviewed documented beta blocker date and time   Airway Mallampati: II  TM Distance: >3 FB Neck ROM: Full    Dental no notable dental hx. (+) Chipped, Dental Advisory Given,    Pulmonary neg pulmonary ROS, former smoker,    Pulmonary exam normal breath sounds clear to auscultation       Cardiovascular hypertension, Pt. on home beta blockers and Pt. on medications Normal cardiovascular exam Rhythm:Regular Rate:Normal     Neuro/Psych negative neurological ROS  negative psych ROS   GI/Hepatic Neg liver ROS, hiatal hernia, GERD  Medicated,  Endo/Other  Hypothyroidism   Renal/GU negative Renal ROS  negative genitourinary   Musculoskeletal negative musculoskeletal ROS (+)   Abdominal   Peds  Hematology negative hematology ROS (+)   Anesthesia Other Findings   Reproductive/Obstetrics                            Anesthesia Physical Anesthesia Plan  ASA: II  Anesthesia Plan: General   Post-op Pain Management:    Induction: Intravenous and Rapid sequence  PONV Risk Score and Plan: 3 and Midazolam, Dexamethasone and Ondansetron  Airway Management Planned: Oral ETT  Additional Equipment:   Intra-op Plan:   Post-operative Plan: Extubation in OR  Informed Consent: I have reviewed the patients History and Physical, chart, labs and discussed the procedure including the risks, benefits and alternatives for the proposed anesthesia with the patient or authorized representative who has indicated his/her understanding and acceptance.     Dental advisory given  Plan Discussed with: CRNA  Anesthesia Plan Comments:        Anesthesia Quick Evaluation

## 2019-01-28 NOTE — Interval H&P Note (Signed)
History and Physical Interval Note:  01/28/2019 8:12 AM  Robyn Ashley  has presented today for surgery, with the diagnosis of GERD.  The various methods of treatment have been discussed with the patient and family. After consideration of risks, benefits and other options for treatment, the patient has consented to  Procedure(s): XI ROBOTIC ASSISTED LAPAROSCOPIC NISSEN FUNDOPLICATION and [name] (N/A) as a surgical intervention.  The patient's history has been reviewed, patient examined, no change in status, stable for surgery.  I have reviewed the patient's chart and labs.  Questions were answered to the patient's satisfaction.     Sun Wilensky Rich Brave

## 2019-01-29 ENCOUNTER — Ambulatory Visit (HOSPITAL_COMMUNITY): Payer: Medicare Other

## 2019-01-29 DIAGNOSIS — J918 Pleural effusion in other conditions classified elsewhere: Secondary | ICD-10-CM | POA: Diagnosis not present

## 2019-01-29 DIAGNOSIS — E039 Hypothyroidism, unspecified: Secondary | ICD-10-CM | POA: Diagnosis not present

## 2019-01-29 DIAGNOSIS — I10 Essential (primary) hypertension: Secondary | ICD-10-CM | POA: Diagnosis not present

## 2019-01-29 DIAGNOSIS — K449 Diaphragmatic hernia without obstruction or gangrene: Secondary | ICD-10-CM | POA: Diagnosis not present

## 2019-01-29 DIAGNOSIS — R0902 Hypoxemia: Secondary | ICD-10-CM | POA: Diagnosis not present

## 2019-01-29 DIAGNOSIS — J9811 Atelectasis: Secondary | ICD-10-CM | POA: Diagnosis not present

## 2019-01-29 DIAGNOSIS — Z87891 Personal history of nicotine dependence: Secondary | ICD-10-CM | POA: Diagnosis not present

## 2019-01-29 DIAGNOSIS — E8779 Other fluid overload: Secondary | ICD-10-CM | POA: Diagnosis not present

## 2019-01-29 LAB — BASIC METABOLIC PANEL
Anion gap: 11 (ref 5–15)
BUN: 15 mg/dL (ref 8–23)
CO2: 27 mmol/L (ref 22–32)
Calcium: 8 mg/dL — ABNORMAL LOW (ref 8.9–10.3)
Chloride: 101 mmol/L (ref 98–111)
Creatinine, Ser: 0.75 mg/dL (ref 0.44–1.00)
GFR calc Af Amer: >60 mL/min (ref >60)
GFR calc non Af Amer: >60 mL/min (ref >60)
Glucose, Bld: 110 mg/dL — ABNORMAL HIGH (ref 70–99)
Potassium: 3.3 mmol/L — ABNORMAL LOW (ref 3.5–5.1)
Sodium: 139 mmol/L (ref 135–145)

## 2019-01-29 LAB — CBC
HCT: 32.1 % — ABNORMAL LOW (ref 36.0–46.0)
Hemoglobin: 10.2 g/dL — ABNORMAL LOW (ref 12.0–15.0)
MCH: 29.3 pg (ref 26.0–34.0)
MCHC: 31.8 g/dL (ref 30.0–36.0)
MCV: 92.2 fL (ref 80.0–100.0)
Platelets: 309 10*3/uL (ref 150–400)
RBC: 3.48 MIL/uL — ABNORMAL LOW (ref 3.87–5.11)
RDW: 13.9 % (ref 11.5–15.5)
WBC: 9.1 10*3/uL (ref 4.0–10.5)
nRBC: 0 % (ref 0.0–0.2)

## 2019-01-29 LAB — MAGNESIUM: Magnesium: 1.5 mg/dL — ABNORMAL LOW (ref 1.7–2.4)

## 2019-01-29 MED ORDER — ONDANSETRON 4 MG PO TBDP: 4.0000 mg | ORAL_TABLET | Freq: Four times a day (QID) | ORAL | 2 refills | Status: DC | PRN

## 2019-01-29 MED ORDER — IOHEXOL 350 MG/ML SOLN
100.0000 mL | Freq: Once | INTRAVENOUS | Status: AC | PRN
  Administered 2019-01-29: 100 mL via INTRAVENOUS

## 2019-01-29 MED ORDER — TRAMADOL HCL 50 MG PO TABS: 50.0000 mg | ORAL_TABLET | Freq: Four times a day (QID) | ORAL | 0 refills | Status: DC | PRN

## 2019-01-29 MED ORDER — POTASSIUM CHLORIDE 20 MEQ PO PACK
20.0000 meq | PACK | Freq: Once | ORAL | Status: AC
  Administered 2019-01-29: 20 meq via ORAL
  Filled 2019-01-29: qty 1

## 2019-01-29 MED ORDER — IOHEXOL 350 MG/ML SOLN
150.0000 mL | Freq: Once | INTRAVENOUS | Status: AC | PRN
  Administered 2019-01-29: 150 mL via ORAL

## 2019-01-29 MED ORDER — DOCUSATE SODIUM 100 MG PO CAPS: 100.0000 mg | ORAL_CAPSULE | Freq: Two times a day (BID) | ORAL | 0 refills | Status: DC

## 2019-01-29 MED ORDER — MAGNESIUM SULFATE 4 GM/100ML IV SOLN
4.0000 g | Freq: Once | INTRAVENOUS | Status: AC
  Administered 2019-01-29: 4 g via INTRAVENOUS
  Filled 2019-01-29: qty 100

## 2019-01-29 MED ORDER — POTASSIUM CHLORIDE 10 MEQ/100ML IV SOLN
10.0000 meq | INTRAVENOUS | Status: AC
  Administered 2019-01-29 (×4): 10 meq via INTRAVENOUS
  Filled 2019-01-29: qty 100

## 2019-01-29 MED ORDER — SODIUM CHLORIDE (PF) 0.9 % IJ SOLN
INTRAMUSCULAR | Status: AC
  Filled 2019-01-29: qty 50

## 2019-01-29 NOTE — Progress Notes (Signed)
PT demonstrated hands on understanding of Flutter device- npc at this time.

## 2019-01-29 NOTE — Discharge Instructions (Signed)
EATING AFTER YOUR ESOPHAGEAL SURGERY (Stomach Fundoplication, Hiatal Hernia repair, Achalasia surgery, etc)  ######################################################################  EAT Start with a pureed / full liquid diet (see below) Gradually transition to a high fiber diet with a fiber supplement over the next month after discharge.    WALK Walk an hour a day.  Control your pain to do that.    CONTROL PAIN Control pain so that you can walk, sleep, tolerate sneezing/coughing, go up/down stairs.  HAVE A BOWEL MOVEMENT DAILY Keep your bowels regular to avoid problems.  OK to try a laxative to override constipation.  OK to use an antidairrheal to slow down diarrhea.  Call if not better after 2 tries  CALL IF YOU HAVE PROBLEMS/CONCERNS Call if you are still struggling despite following these instructions. Call if you have concerns not answered by these instructions  ######################################################################   After your esophageal surgery, expect some sticking with swallowing over the next 1-2 months.    If food sticks when you eat, it is called "dysphagia".  This is due to swelling around your esophagus at the wrap & hiatal diaphragm repair.  It will gradually ease off over the next few months.  To help you through this temporary phase, we start you out on a pureed (blenderized) diet.  Your first meal in the hospital was thin liquids.  You should have been given a pureed diet by the time you left the hospital.  We ask patients to stay on a pureed diet for the first 2-3 weeks to avoid anything getting "stuck" near your recent surgery.  Don't be alarmed if your ability to swallow doesn't progress according to this plan.  Everyone is different and some diets can advance more or less quickly.     Some BASIC RULES to follow are:  Maintain an upright position whenever eating or drinking.  Take small bites - just a teaspoon size bite at a time.  Eat slowly.   It may also help to eat only one food at a time.  Consider nibbling through smaller, more frequent meals & avoid the urge to eat BIG meals  Do not push through feelings of fullness, nausea, or bloatedness  Do not mix solid foods and liquids in the same mouthful  Try not to "wash foods down" with large gulps of liquids.  Avoid carbonated (bubbly/fizzy) drinks.    Avoid foods that make you feel gassy or bloated.  Start with bland foods first.  Wait on trying greasy, fried, or spicy meals until you are tolerating more bland solids well.  Understand that it will be hard to burp and belch at first.  This gradually improves with time.  Expect to be more gassy/flatulent/bloated initially.  Walking will help your body manage it better.  Consider using medications for bloating that contain simethicone such as  Maalox or Gas-X   Eat in a relaxed atmosphere & minimize distractions.  Avoid talking while eating.    Do not use straws.  Following each meal, sit in an upright position (90 degree angle) for 60 to 90 minutes.  Going for a short walk can help as well  If food does stick, don't panic.  Try to relax and let the food pass on its own.  Sipping WARM LIQUID such as strong hot black tea can also help slide it down.   Be gradual in changes & use common sense:  -If you easily tolerating a certain "level" of foods, advance to the next level gradually -If you are  having trouble swallowing a particular food, then avoid it.   -If food is sticking when you advance your diet, go back to thinner previous diet (the lower LEVEL) for 1-2 days.  LEVEL 1 = PUREED DIET  Do for the first 2 WEEKS AFTER SURGERY  -Foods in this group are pureed or blenderized to a smooth, mashed potato-like consistency.  -If necessary, the pureed foods can keep their shape with the addition of a thickening agent.   -Meat should be pureed to a smooth, pasty consistency.  Hot broth or gravy may be added to the pureed  meat, approximately 1 oz. of liquid per 3 oz. serving of meat. -CAUTION:  If any foods do not puree into a smooth consistency, swallowing will be more difficult.  (For example, nuts or seeds sometimes do not blend well.)  Hot Foods Cold Foods  Pureed scrambled eggs and cheese Pureed cottage cheese  Baby cereals Thickened juices and nectars  Thinned cooked cereals (no lumps) Thickened milk or eggnog  Pureed [place] toast or pancakes Ensure  Mashed potatoes Ice cream  Pureed parsley, au gratin, scalloped potatoes, candied sweet potatoes Fruit or [place] ice, sherbet  Pureed buttered or alfredo noodles Plain yogurt  Pureed vegetables (no corn or peas) Instant breakfast  Pureed soups and creamed soups Smooth pudding, mousse, custard  Pureed scalloped apples Whipped gelatin  Gravies Sugar, syrup, honey, jelly  Sauces, cheese, tomato, barbecue, white, creamed Cream  Any baby food Creamer  Alcohol in moderation (not beer or champagne) Margarine  Coffee or tea Mayonnaise   Ketchup, mustard   Apple sauce   SAMPLE MENU:  PUREED DIET Breakfast Lunch Dinner   Orange juice, 1/2 cup  Cream of wheat, 1/2 cup  Pineapple juice, 1/2 cup  Pureed [place], barley soup, 3/4 cup  Pureed Hawaiian chicken, 3 oz   Scrambled eggs, mashed or blended with cheese, 1/2 cup  Tea or coffee, 1 cup   Whole milk, 1 cup   Non-dairy creamer, 2 Tbsp.  Mashed potatoes, 1/2 cup  Pureed cooled broccoli, 1/2 cup  Apple sauce, 1/2 cup  Coffee or tea  Mashed potatoes, 1/2 cup  Pureed spinach, 1/2 cup  Frozen yogurt, 1/2 cup  Tea or coffee      LEVEL 2 = SOFT DIET  After your first 2 weeks, you can advance to a soft diet.   Keep on this diet until everything goes down easily.  Hot Foods Cold Foods  White fish Cottage cheese  Stuffed fish Junior baby fruit  Baby food meals Semi thickened juices  Minced soft cooked, scrambled, poached eggs nectars  Souffle & omelets Ripe mashed bananas  Cooked  cereals Canned fruit, pineapple sauce, milk  potatoes Milkshake  Buttered or Alfredo noodles Custard  Cooked cooled vegetable Puddings, including tapioca  Sherbet Yogurt  Vegetable soup or alphabet soup Fruit ice, [place] ice  Gravies Whipped gelatin  Sugar, syrup, honey, jelly Junior baby desserts  Sauces:  Cheese, creamed, barbecue, tomato, white Cream  Coffee or tea Margarine   SAMPLE MENU:  LEVEL 2 Breakfast Lunch Dinner   Orange juice, 1/2 cup  Oatmeal, 1/2 cup  Scrambled eggs with cheese, 1/2 cup  Decaffeinated tea, 1 cup  Whole milk, 1 cup  Non-dairy creamer, 2 Tbsp  Pineapple juice, 1/2 cup  Minced beef, 3 oz  Gravy, 2 Tbsp  Mashed potatoes, 1/2 cup  Minced fresh broccoli, 1/2 cup  Applesauce, 1/2 cup  Coffee, 1 cup  [place], barley soup, 3/4 cup  Minced Hawaiian chicken, 3 oz  Mashed potatoes, 1/2 cup  Cooked spinach, 1/2 cup  Frozen yogurt, 1/2 cup  Non-dairy creamer, 2 Tbsp      LEVEL 3 = CHOPPED DIET  -After all the foods in level 2 (soft diet) are passing through well you should advance up to more chopped foods.  -It is still important to cut these foods into small pieces and eat slowly.  Hot Foods Cold Foods  Poultry Cottage cheese  Chopped Swedish meatballs Yogurt  Meat salads (ground or flaked meat) Milk  Flaked fish (tuna) Milkshakes  Poached or scrambled eggs Soft, cold, dry cereal  Souffles and omelets Fruit juices or nectars  Cooked cereals Chopped canned fruit  Chopped [place] toast or pancakes Canned fruit cocktail  Noodles or pasta (no rice) Pudding, mousse, custard  Cooked vegetables (no frozen peas, corn, or mixed vegetables) Green salad  Canned small sweet peas Ice cream  Creamed soup or vegetable soup Fruit ice, [place] ice  Pureed vegetable soup or alphabet soup Non-dairy creamer  Ground scalloped apples Margarine  Gravies Mayonnaise  Sauces:  Cheese, creamed, barbecue, tomato, white Ketchup  Coffee or tea Mustard    SAMPLE MENU:  LEVEL 3 Breakfast Lunch Dinner   Orange juice, 1/2 cup  Oatmeal, 1/2 cup  Scrambled eggs with cheese, 1/2 cup  Decaffeinated tea, 1 cup  Whole milk, 1 cup  Non-dairy creamer, 2 Tbsp  Ketchup, 1 Tbsp  Margarine, 1 tsp  Salt, 1/4 tsp  Sugar, 2 tsp  Pineapple juice, 1/2 cup  Ground beef, 3 oz  Gravy, 2 Tbsp  Mashed potatoes, 1/2 cup  Cooked spinach, 1/2 cup  Applesauce, 1/2 cup  Decaffeinated coffee  Whole milk  Non-dairy creamer, 2 Tbsp  Margarine, 1 tsp  Salt, 1/4 tsp  Pureed [place], barley soup, 3/4 cup  Barbecue chicken, 3 oz  Mashed potatoes, 1/2 cup  Ground fresh broccoli, 1/2 cup  Frozen yogurt, 1/2 cup  Decaffeinated tea, 1 cup  Non-dairy creamer, 2 Tbsp  Margarine, 1 tsp  Salt, 1/4 tsp  Sugar, 1 tsp    LEVEL 4:  REGULAR FOODS  -Foods in this group are soft, moist, regularly textured foods.   -This level includes meat and breads, which tend to be the hardest things to swallow.   -Eat very slowly, chew well and continue to avoid carbonated drinks. -most people are at this level in 4-6 weeks  Hot Foods Cold Foods  Baked fish or skinned Soft cheeses - cottage cheese  Souffles and omelets Cream cheese  Eggs Yogurt  Stuffed shells Milk  Spaghetti with meat sauce Milkshakes  Cooked cereal Cold dry cereals (no nuts, dried fruit, coconut)  [place] toast or pancakes Crackers  Buttered toast Fruit juices or nectars  Noodles or pasta (no rice) Canned fruit  Potatoes (all types) Ripe bananas  Soft, cooked vegetables (no corn, lima, or baked beans) Peeled, ripe, fresh fruit  Creamed soups or vegetable soup Cakes (no nuts, dried fruit, coconut)  Canned chicken noodle soup Plain doughnuts  Gravies Ice cream  Bacon dressing Pudding, mousse, custard  Sauces:  Cheese, creamed, barbecue, tomato, white Fruit ice, [place] ice, sherbet  Decaffeinated tea or coffee Whipped gelatin  Pork chops Regular gelatin   Canned fruited  gelatin molds   Sugar, syrup, honey, jam, jelly   Cream   Non-dairy   Margarine   Oil   Mayonnaise   Ketchup   Mustard   TROUBLESHOOTING IRREGULAR BOWELS  1) Avoid extremes of bowel  movements (no bad constipation/diarrhea)  2) Miralax 17gm mixed in [place]. water or juice-daily. May use BID as needed.  3) Gas-x,Phazyme, etc. as needed for gas & bloating.  4) Soft,bland diet. No spicy,greasy,fried foods.  5) Prilosec over-the-counter as needed  6) May hold gluten/wheat products from diet to see if symptoms improve.  7) May try probiotics (Align, Activa, etc) to help calm the bowels down  7) If symptoms become worse call back immediately.    If you have any questions please call our office at [place]: [phone].   LAPAROSCOPIC SURGERY: POST OP INSTRUCTIONS Always review your discharge instruction sheet given to you by the facility where your surgery was performed. IF YOU HAVE DISABILITY OR FAMILY LEAVE FORMS, YOU MUST BRING THEM TO THE OFFICE FOR PROCESSING.   DO NOT GIVE THEM TO YOUR DOCTOR.  1. A prescription for pain medication may be given to you upon discharge.  Take your pain medication as prescribed, if needed.  If narcotic pain medicine is not needed, then you may take acetaminophen (Tylenol) or ibuprofen (Advil) as needed. 2. Take your usually prescribed medications unless otherwise directed. 3. If you need a refill on your pain medication, please contact your pharmacy.  They will contact our office to request authorization. Prescriptions will not be filled after 5pm or on week-ends. 4. Most patients will experience some swelling and bruising in the area of the incisions.  Ice packs will help.  Swelling and bruising can take several days to resolve.  5. It is common to experience some constipation if taking pain medication after surgery.  Increasing fluid intake and taking a stool softener (such as Colace) will usually help or prevent this problem from  occurring.  A mild laxative (Milk of Magnesia or Miralax) should be taken according to package instructions if there are no bowel movements after 48 hours. 6. You may shower. Your surgeon used skin glue on the incision, which will flake off over the next 2-3 weeks.  No rubbing, scrubbing, lotions or ointments to incisions. No soaking or swimming for 2 weeks.  7. ACTIVITIES:  You may resume regular (light) daily activities beginning the next day--such as daily self-care, walking, climbing stairs--gradually increasing activities as tolerated.  You may have sexual intercourse when it is comfortable.  Refrain from any heavy lifting or straining until approved by your doctor. a. You may drive when you are no longer taking prescription pain medication, you can comfortably wear a seatbelt, and you can safely maneuver your car and apply brakes. b. RETURN TO WORK:  _____1-2 weeks_____________________________________________________ 8. You should see your doctor in the office for a follow-up appointment approximately 2-3 weeks after your surgery.  Make sure that you call for this appointment within a day or two after you arrive home to insure a convenient appointment time.  WHEN TO CALL YOUR DOCTOR: 1. Fever over 101.0 2. Inability to urinate 3. Continued bleeding from incision. 4. Increased pain, redness, or drainage from the incision. 5. Increasing abdominal pain  The clinic staff is available to answer your questions during regular business hours.  Please dont hesitate to call and ask to speak to one of the nurses for clinical concerns.  If you have a medical emergency, go to the nearest emergency room or call 911.  A surgeon from [facility] is always on call at the hospital. [address] [place], [place], [place]  [address] ? [address] [place], [place], [place]   [address] [phone] ?  [phone] ? FAX [phone] Web site: [email or web address]

## 2019-01-29 NOTE — Progress Notes (Addendum)
On call notified of BP: 100/52, 103/58 (patient calm, asymptomatic), okay to hold metoprolol 25mg  when SBP <110 per MD Norlene Duel RN, BSN

## 2019-01-29 NOTE — Progress Notes (Signed)
Ambulated 117ft on RA; sats drop to 84% and then back up to 86% consistently. Patient reports feeling fine; no SOB, just feels tired. Return to room and placed on 2LPM [place]. Will continue to monitor.

## 2019-01-29 NOTE — Progress Notes (Addendum)
S: Feels well this morning. Denies pain other than some shoulder soreness. Denies nausea or dysphagia to liquids. Has walked to the bathroom but that's it. Denies chest pain or shortness of breath. BP ran low overnight (100s/50s) and one recorded desat to 88%. Asymptomatic. Foley removed last night and she has voided.   Vitals, labs, intake/output, and orders reviewed:  Tmax 99, Tc 98.8. HR 76-87 (betablocked). BP 111/61 this morning, was low 100s/50s most of the night, was 140-150s/70-80s in the immediate post-op period. All BP meds have been held (toprol 100, enalapril-hctz 10/25)   Sats 88-100, last recorded 94% on nasal cannula.   BMP notable for hypokalemia 3.3, hypomagnesemia 1.5. Otherwise normal- Bicarb 27, creat 0.75, BUN 15 CBC- WBC 9.1 (7.7 preop), Hgb 10.2 (12.3), Plt 309 (421)  PO 1320, UOP 1050. appx 3L positive.   Gen: A&Ox3, no distress  H&N: EOMI, atraumatic, neck supple Chest: unlabored respirations, RRR Abd: soft, nontender, nondistended, incisions c/d/i with dermabond, slight bruising but no hematoma or cellulitis Ext: warm, no edema Neuro: grossly normal  Lines/tubes/drains: PIV  A/P:  POD 1 robotic hiatal hernia repair and Nissen fundoplication -Upper GI this morning -Advance to fulls--> puree diet  -Replace K+ and Mag for hypokalemia and hypomagnesemia  -Hold antihypertensives. Suspect BP issues overnight combination of factors, seems to be trending up this morning. Hgb did drop from 12 to 10 but this is at least partially dilutional, there was minimal blood loss during the case. Hold gabapentin.   -Aggressive pulmonary toilet- IS, add flutter valve, wean off O2  -Needs to walk in the halls. Continue prophylactic dose lovenox and SCDs.    Romana Juniper, MD [facility], [place] Pager [phone]

## 2019-01-29 NOTE — Progress Notes (Signed)
Check O2 at 1135 with sat 94% on RA; patient has been off of oxygen since 0845 this morning. RT in room shortly after with resulting sat of 86%. Recheck sat with floor machine on right hand and RT sat machine on left hand to compare. Floor sat showing 91-92% on RA; RT machine showing 92% RA. Placed on 2LPM [place]. Dr. Kae Heller notified.

## 2019-01-29 NOTE — Progress Notes (Signed)
Patient reevaluated due to noted sat of 88 on chart check.  There have been some discrepancies between pulse oxes-she was just checked with the floor monitor and her sat off oxygen was 94 however just a moment later on a different pulse ox her sat read 86.  She is completely asymptomatic. urrently respiratory therapy is at the bedside, she has been on 2 L nasal cannula for about 3 minutes and her pulse ox reads 95%.  She feels well, in fact she feels well enough to go home.  She is tolerating liquids.  Her upper GI looked fine.  She denies any chest pain, chest pressure, left neck or arm pain, denies any nausea or epigastric pain.  She does not feel short of breath.   She quit smoking remotely.  Her preoperative chest x-ray was fine, it did note some minimal scarring or atelectasis at the lingula.  Last EKG was in February and was sinus bradycardia.   She has not yet walked in the halls, but has been getting up and going to the bathroom on her own without any shortness of breath or lightheadedness with this.  She does state that prior to surgery, she would have issues with dizziness if she stood up too fast or when turning corners.  I wonder if her blood pressure medication regimen needs to be reduced.   She is alert and cooperative.  Appears comfortable and in good spirits. Skin is warm and dry, pink with good capillary refill, Respiratory rate is about 15, respirations are unlabored, with symmetrical chest rise.   Abdomen remains soft and nontender. Lower extremities are without any edema, no calf tenderness, negative Homans sign   We will get a chest x-ray and EKG. I wonder if she has some baseline/ preop intermittent sat drops given how asymptomatic she is.  She will continue using her I-S and she now has a flutter valve.  She will walk in the halls.  We will continue to monitor.  If off oxygen this afternoon and continuing to do well, she could still potentially go home.  If she still having  any issues however we will keep her here another night.  If ongoing issues, may need CT PE and/or pulmonology consult.

## 2019-01-29 NOTE — Plan of Care (Signed)
Patient lying in bed this morning; pain controlled, just has some aches in shoulders. Has been up to bathroom and has voided post foley removal. Will continue to monitor.

## 2019-01-30 ENCOUNTER — Encounter (HOSPITAL_COMMUNITY): Payer: Self-pay | Admitting: Pulmonary Disease

## 2019-01-30 ENCOUNTER — Telehealth: Payer: Self-pay | Admitting: Pulmonary Disease

## 2019-01-30 DIAGNOSIS — R0902 Hypoxemia: Secondary | ICD-10-CM | POA: Diagnosis not present

## 2019-01-30 DIAGNOSIS — E039 Hypothyroidism, unspecified: Secondary | ICD-10-CM | POA: Diagnosis present

## 2019-01-30 DIAGNOSIS — K449 Diaphragmatic hernia without obstruction or gangrene: Secondary | ICD-10-CM | POA: Diagnosis present

## 2019-01-30 DIAGNOSIS — I10 Essential (primary) hypertension: Secondary | ICD-10-CM | POA: Diagnosis present

## 2019-01-30 DIAGNOSIS — J918 Pleural effusion in other conditions classified elsewhere: Secondary | ICD-10-CM | POA: Diagnosis present

## 2019-01-30 DIAGNOSIS — J90 Pleural effusion, not elsewhere classified: Secondary | ICD-10-CM | POA: Diagnosis not present

## 2019-01-30 DIAGNOSIS — E8779 Other fluid overload: Secondary | ICD-10-CM | POA: Diagnosis present

## 2019-01-30 DIAGNOSIS — J9811 Atelectasis: Secondary | ICD-10-CM | POA: Diagnosis not present

## 2019-01-30 DIAGNOSIS — Z87891 Personal history of nicotine dependence: Secondary | ICD-10-CM | POA: Diagnosis not present

## 2019-01-30 LAB — CBC
HCT: 31.1 % — ABNORMAL LOW (ref 36.0–46.0)
Hemoglobin: 9.8 g/dL — ABNORMAL LOW (ref 12.0–15.0)
MCH: 29.9 pg (ref 26.0–34.0)
MCHC: 31.5 g/dL (ref 30.0–36.0)
MCV: 94.8 fL (ref 80.0–100.0)
Platelets: 256 10*3/uL (ref 150–400)
RBC: 3.28 MIL/uL — ABNORMAL LOW (ref 3.87–5.11)
RDW: 14.3 % (ref 11.5–15.5)
WBC: 8.4 10*3/uL (ref 4.0–10.5)
nRBC: 0 % (ref 0.0–0.2)

## 2019-01-30 LAB — BASIC METABOLIC PANEL
Anion gap: 6 (ref 5–15)
BUN: 15 mg/dL (ref 8–23)
CO2: 29 mmol/L (ref 22–32)
Calcium: 8 mg/dL — ABNORMAL LOW (ref 8.9–10.3)
Chloride: 107 mmol/L (ref 98–111)
Creatinine, Ser: 0.77 mg/dL (ref 0.44–1.00)
GFR calc Af Amer: >60 mL/min (ref >60)
GFR calc non Af Amer: >60 mL/min (ref >60)
Glucose, Bld: 86 mg/dL (ref 70–99)
Potassium: 4.1 mmol/L (ref 3.5–5.1)
Sodium: 142 mmol/L (ref 135–145)

## 2019-01-30 LAB — MAGNESIUM: Magnesium: 2.2 mg/dL (ref 1.7–2.4)

## 2019-01-30 MED ORDER — IPRATROPIUM-ALBUTEROL 0.5-2.5 (3) MG/3ML IN SOLN: 3.0000 mL | RESPIRATORY_TRACT | Status: DC | PRN

## 2019-01-30 MED ORDER — FUROSEMIDE 10 MG/ML IJ SOLN
20.0000 mg | Freq: Once | INTRAMUSCULAR | Status: AC
  Administered 2019-01-30: 20 mg via INTRAVENOUS
  Filled 2019-01-30: qty 2

## 2019-01-30 NOTE — Consult Note (Signed)
NAME:  Robyn Ashley, MRN:  [id number], DOB:  1951-07-07, LOS: 0 ADMISSION DATE:  01/28/2019, CONSULTATION DATE:  01/30/19 REFERRING MD:  Dr. Redmond Pulling, CHIEF COMPLAINT:  Ambulatory desaturation    Brief History   68 y/o F admitted 7/1 for nissen fundoplication and repair of hiatal hernia.  Post operatively noted to have hypoxemia, PE negative.   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   The patient is a retired Therapist, sports.  She has no known occupational exposures. She smoked from age 70 until 2020, averaging 1ppd but smoked up to 2ppd.  She has a hx of narcolepsy that is treated with ritalin and OSA but does not wear CPAP.  She previously saw Dr. Annamaria Boots at [facility] but has not worn CPAP in many years.    She underwent nissen fundoplication on 7/1 without complication.  Post operatively she was noted to have low oxygen saturations requiring O2.  Saturations noted as low as 87% on room air. CTA of the chest was completed which was negative for PE but showed mild background emphysema, small volume pneumomediastinum and trace layering pleural effusions. She remained on 2-3L O2. Pt reports she has abdominal pain and feels this is inhibiting her ability to perform IS/flutter. She can pull ~ 800-977ml on IS.     PCCM consulted for evaluation of hypoxemia.     Past Medical History  MVP  Intermittent Palpations HTN DJD GERD  Hiatal Hernia  Former Smoker - quit 2020, 0.5-2 ppd, averaged 1ppd Narcolepsy / OSA - not on CPAP  Significant Hospital Events   7/01 Admit for Hiatal Hernia Repair / Nissen Fundoplication  89  Desaturations with exertion, PCCM consulted   Consults:  PCCM   Procedures:    Significant Diagnostic Tests:  CTA PE 7/2 >> negative for PE, postoperative changes from fundoplication including small volume pneumomediastinum, gastroesophageal region soft tissue  edema, trace layering pleural effusion  Micro Data:     Antimicrobials:     Interim history/subjective:  As above  Objective   Blood pressure (!) 143/74, pulse 82, temperature 98.2 F (36.8 C), temperature source Oral, resp. rate 18, height 5\' 1"  (1.549 m), weight 69 kg, SpO2 (!) 87 %.        Intake/Output Summary (Last 24 hours) at 01/30/2019 1030 Last data filed at 01/30/2019 0600 Gross per 24 hour  Intake 1566.89 ml  Output 2200 ml  Net -633.11 ml   Filed Weights   01/28/19 7001  Weight: 69 kg    Examination: General: adult female sitting up in chair in NAD HEENT: MM pink/moist, wearing glasses Neuro: AAOx4, speech clear, MAE CV: s1s2 rrr, ? soft SEM  PULM: even/non-labored, lungs bilaterally with faint posterior crackles  VC:BSWH, non-tender, bsx4 active  Extremities: warm/dry, no peripheral edema  Skin: no rashes or lesions  Resolved Hospital Problem list      Assessment & Plan:   Acute Hypoxic Respiratory Failure  -suspect this is multifactorial in nature with mild background emphysema noted on CT, longstanding hx of smoking, untreated OSA (raises question of chronic element of hypoxia), trace pleural effusions / mild pulmonary edema and recent abdominal surgery with splinting / degree of atelectasis.  Question component of OHS with elevated CO2 on BMP.    P: Push pulmonary hygiene - IS, flutter Mobilize, ambulate BID  Lasix 20 mg IV x1  > reviewed risk of  contrast / lasix with patient  Balance pain control to allow for adequate pulmonary hygiene with risk of oversedation in setting of OSA Reassess in am after diuresis for ambulatory O2 needs, if remains hypoxic can discharge home with O2 and follow up in Pulmonary office for repeat evaluation. Assess ABG to r/o hypercarbia in am   OSA  Narcolepsy  P: Continue ritalin at discharge Follow up for sleep evaluation as outpatient with Dr. Halford Chessman, have notified office to contact patient with an appointment    Best practice:  Diet: Per [facility]  DVT prophylaxis: lovenox  GI prophylaxis: PPI  Glucose control: n/a  Mobility: as tolerated  Code Status: Full Code  Family Communication: Patient updated on plan of care  Disposition: Per [facility]   Labs   CBC: Recent Labs  Lab 01/29/19 0346 01/30/19 0313  WBC 9.1 8.4  HGB 10.2* 9.8*  HCT 32.1* 31.1*  MCV 92.2 94.8  PLT 309 301    Basic Metabolic Panel: Recent Labs  Lab 01/29/19 0346 01/30/19 0313  NA 139 142  K 3.3* 4.1  CL 101 107  CO2 27 29  GLUCOSE 110* 86  BUN 15 15  CREATININE 0.75 0.77  CALCIUM 8.0* 8.0*  MG 1.5* 2.2   GFR: Estimated Creatinine Clearance: 62.3 mL/min (by C-G formula based on SCr of 0.77 mg/dL). Recent Labs  Lab 01/29/19 0346 01/30/19 0313  WBC 9.1 8.4    Liver Function Tests: No results for input(s): AST, ALT, ALKPHOS, BILITOT, PROT, ALBUMIN in the last 168 hours. No results for input(s): LIPASE, AMYLASE in the last 168 hours. No results for input(s): AMMONIA in the last 168 hours.  ABG No results found for: PHART, PCO2ART, PO2ART, HCO3, TCO2, ACIDBASEDEF, O2SAT   Coagulation Profile: No results for input(s): INR, PROTIME in the last 168 hours.  Cardiac Enzymes: No results for input(s): CKTOTAL, CKMB, CKMBINDEX, TROPONINI in the last 168 hours.  HbA1C: No results found for: HGBA1C  CBG: No results for input(s): GLUCAP in the last 168 hours.  Review of Systems: Positives in [place]   Gen: Denies fever, chills, weight change, fatigue, night sweats HEENT: Denies blurred vision, double vision, hearing loss, tinnitus, sinus congestion, rhinorrhea, sore throat, neck stiffness, dysphagia PULM: Denies shortness of breath, cough, sputum production, hemoptysis, wheezing CV: Denies chest pain, edema, orthopnea, paroxysmal nocturnal dyspnea, palpitations GI: Denies abdominal pain, nausea, vomiting, diarrhea, hematochezia, melena, constipation, change in bowel habits GU: Denies dysuria, hematuria, polyuria,  oliguria, urethral discharge Endocrine: Denies hot or cold intolerance, polyuria, polyphagia or appetite change Derm: Denies rash, dry skin, scaling or peeling skin change Heme: Denies easy bruising, bleeding, bleeding gums Neuro: Denies headache, numbness, weakness, slurred speech, loss of memory or consciousness  Past Medical History  She,  has a past medical history of GERD (gastroesophageal reflux disease), Hiatal hernia, History of degenerative disc disease, History of nuclear stress test (07/27/2013), Hypertension, Intermittent palpitations, and MVP (mitral valve prolapse).   Surgical History    Past Surgical History:  Procedure Laterality Date  . ABDOMINAL HYSTERECTOMY    . BIOPSY  06/24/2018   Procedure: BIOPSY;  Surgeon: Daneil Dolin, MD;  [facility];  Service: Endoscopy;;  (gastric) (colon)  . COLONOSCOPY N/A 06/24/2018   Procedure: COLONOSCOPY;  Surgeon: Daneil Dolin, MD;  [facility]: [facility];  Service: Endoscopy;  Laterality: N/A;  1:45pm  . ESOPHAGOGASTRODUODENOSCOPY N/A 06/24/2018   Procedure: ESOPHAGOGASTRODUODENOSCOPY (EGD);  Surgeon: Daneil Dolin, MD;  [facility]: [facility];  Service:  Endoscopy;  Laterality: N/A;  . EYE SURGERY Bilateral    rk  . MALONEY DILATION N/A 06/24/2018   Procedure: Venia Minks DILATION;  Surgeon: Daneil Dolin, MD;  [facility]: [facility];  Service: Endoscopy;  Laterality: N/A;  . POLYPECTOMY  06/24/2018   Procedure: POLYPECTOMY;  Surgeon: Daneil Dolin, MD;  [facility]: [facility];  Service: Endoscopy;;  colon   . RF ablation of cervical nerves    . ROTATOR CUFF REPAIR Right 11-21-2006  dr supple @[facility]      Social History   reports that she quit smoking about 40 years ago. Her smoking use included cigarettes. She smoked 1.50 packs per day. She has never used smokeless tobacco. She reports previous alcohol use. She reports that she does not use drugs.   Family History   Her family history includes Colon cancer  in her maternal grandfather and paternal grandmother.   Allergies Allergies  Allergen Reactions  . Tetracyclines & Related Other (See Comments)    Pt developed gastritis while taking tetracycline  . Aspirin     bruises  . Norvasc [Amlodipine Besylate]     Made BP drop low  . Nsaids     bruising  . Dexilant [Dexlansoprazole] Diarrhea     Home Medications  Prior to Admission medications   Medication Sig Start Date End Date Taking? Authorizing Provider  acetaminophen (TYLENOL) 650 MG CR tablet Take 1,300 mg by mouth every 8 (eight) hours as needed for pain.    Yes [provider]  atorvastatin (LIPITOR) 20 MG tablet Take 20 mg by mouth at bedtime.    Yes [provider]  Calcium Carbonate-Vitamin D (CALCIUM 600+D PO) Take 1 tablet by mouth daily.   Yes [provider]  cholecalciferol (VITAMIN D3) 25 MCG (1000 UT) tablet Take 1,000 Units by mouth daily.   Yes [provider]  Coenzyme Q10 (CO Q10) 100 MG CAPS Take 100 mg by mouth daily.    Yes [provider]  dicyclomine (BENTYL) 10 MG capsule TAKE 1 CAPSULE(10 MG) BY MOUTH FOUR TIMES DAILY AS NEEDED FOR SPASMS OR ABDOMINAL PAIN Patient taking differently: Take 10 mg by mouth 4 (four) times daily as needed for spasms.  11/06/18  Yes Carlis Stable, NP  enalapril-hydrochlorothiazide (VASERETIC) 10-25 MG tablet Take 1 tablet by mouth daily. 03/06/18  Yes [provider]  FLUoxetine (PROZAC) 10 MG capsule Take 10 mg by mouth daily.  04/01/18  Yes [provider]  levothyroxine (SYNTHROID, LEVOTHROID) 50 MCG tablet Take 50 mcg by mouth daily.  04/26/18  Yes [provider]  methylphenidate (RITALIN) 20 MG tablet Take 20 mg by mouth 3 (three) times daily.  05/01/18  Yes [provider]  metoprolol succinate (TOPROL-XL) 50 MG 24 hr tablet Take 100 mg by mouth at bedtime.  04/03/18  Yes [provider]  Multiple Vitamin (MULTIVITAMIN) tablet Take 1 tablet by mouth  daily.   Yes [provider]  pantoprazole (PROTONIX) 40 MG tablet TAKE 1 TABLET(40 MG) BY MOUTH DAILY Patient taking differently: Take 40 mg by mouth daily.  12/24/18  Yes Mahala Menghini, PA-C  phenylephrine (SUDAFED PE) 10 MG TABS tablet Take 10 mg by mouth every 6 (six) hours as needed (allergies).    Yes [provider]  docusate sodium (COLACE) 100 MG capsule Take 1 capsule (100 mg total) by mouth 2 (two) times daily. 01/29/19   Clovis Riley, MD  ondansetron (ZOFRAN-ODT) 4 MG disintegrating tablet Take 1  tablet (4 mg total) by mouth every 6 (six) hours as needed for nausea. 01/29/19   Clovis Riley, MD  traMADol (ULTRAM) 50 MG tablet Take 1 tablet (50 mg total) by mouth every 6 (six) hours as needed (Use for pain not controlled by tylenol.). 01/29/19   Clovis Riley, MD         Noe Gens, NP-C [facility] Pgr: [phone] or if no answer [phone] 01/30/2019, 10:30 AM

## 2019-01-30 NOTE — Progress Notes (Signed)
SATURATION QUALIFICATIONS: (This note is used to comply with regulatory documentation for home oxygen)  Patient Saturations on Room Air at Rest 87%  Patient Saturations on Room Air while Ambulating 78%  Patient Saturations on 3 Liters of oxygen while Ambulating = 94%  Please briefly explain why patient needs home oxygen:

## 2019-01-30 NOTE — Progress Notes (Signed)
2 Days Post-Op   Subjective/Chief Complaint: Feels better today but still having some discomfort/pain in upper abd, chest, neck. She thinks that discomfort is dropping her O2 sat when she walks Foods going ok   Objective: Vital signs in last 24 hours: Temp:  [98 F (36.7 C)-98.2 F (36.8 C)] 98.2 F (36.8 C) (07/03 5643) Pulse Rate:  [73-82] 82 (07/03 0608) Resp:  [16-18] 18 (07/03 0608) BP: (110-143)/(59-74) 143/74 (07/03 0608) SpO2:  [87 %-97 %] 87 % (07/03 0608) Last BM Date: 01/29/19  Intake/Output from previous day: 07/02 0701 - 07/03 0700 In: 1925 [P.O.:1280; I.V.:245; IV Piggyback:400] Out: 2200 [Urine:2200] Intake/Output this shift: No intake/output data recorded.  Alert, nontoxic, ox3, approp cta b/l,nonlabored Reg Soft, mild expect TTP, incisions c/d/i No edema  Lab Results:  Recent Labs    01/29/19 0346 01/30/19 0313  WBC 9.1 8.4  HGB 10.2* 9.8*  HCT 32.1* 31.1*  PLT 309 256   BMET Recent Labs    01/29/19 0346 01/30/19 0313  NA 139 142  K 3.3* 4.1  CL 101 107  CO2 27 29  GLUCOSE 110* 86  BUN 15 15  CREATININE 0.75 0.77  CALCIUM 8.0* 8.0*   PT/INR No results for input(s): LABPROT, INR in the last 72 hours. ABG No results for input(s): PHART, HCO3 in the last 72 hours.  Invalid input(s): PCO2, PO2  Studies/Results: Ct Angio Chest Pe W Or Wo Contrast  Result Date: 01/29/2019 CLINICAL DATA:  68 year old female status post hernia repair and Nissen fundoplication postoperative day 1. Hypoxia. EXAM: CT ANGIOGRAPHY CHEST WITH CONTRAST TECHNIQUE: Multidetector CT imaging of the chest was performed using the standard protocol during bolus administration of intravenous contrast. Multiplanar CT image reconstructions and MIPs were obtained to evaluate the vascular anatomy. CONTRAST:  12mL OMNIPAQUE IOHEXOL 350 MG/ML SOLN COMPARISON:  Portable chest earlier today. FINDINGS: Cardiovascular: Good contrast bolus timing in the pulmonary arterial tree. Mild  respiratory motion. Some subsegmental artery detail is obscured. No focal filling defect identified in the pulmonary arteries to suggest acute pulmonary embolism. Small volume pneumomediastinum is likely postoperative. Calcified aortic atherosclerosis. No pericardial effusion. Mediastinum/Nodes: Soft tissue gas at the thoracic inlet in conjunction with the pneumomediastinum. No mediastinal hematoma or lymphadenopathy. Lungs/Pleura: Major airways are patent. Trace layering pleural effusions. Mild lung base atelectasis. No pneumothorax or pulmonary consolidation. Upper Abdomen: Postoperative changes to the gastroesophageal junction with mild regional soft tissue edema. No free fluid in the upper abdomen. Negative visible liver, gallbladder, spleen, pancreas, adrenal glands and kidneys. There is oral contrast at the splenic flexure which appears unremarkable. Musculoskeletal: No acute osseous abnormality identified. Review of the MIP images confirms the above findings. IMPRESSION: 1. Negative for acute pulmonary embolus. 2. Postoperative changes from fundoplication including small volume pneumomediastinum and gastroesophageal region regional soft tissue edema. 3. Trace layering pleural effusions. Mild lung base atelectasis. 4. Aortic Atherosclerosis (ICD10-I70.0). Electronically Signed   By: Genevie Ann M.D.   On: 01/29/2019 17:43   Dg Chest Port 1 View  Result Date: 01/29/2019 CLINICAL DATA:  Hypoxia EXAM: PORTABLE CHEST 1 VIEW COMPARISON:  01/21/2019 FINDINGS: Heart size is normal. I think there is mild basilar atelectasis left more than right. Upper lungs are clear. No evidence pulmonary edema. No bone abnormality. IMPRESSION: Slight worsening of basilar atelectasis left more than right Electronically Signed   By: Nelson Chimes M.D.   On: 01/29/2019 14:07   Dg Esophagus W Single Cm (sol Or Thin Ba)  Result Date: 01/29/2019 CLINICAL DATA:  68 year old female status post Nissen fundoplication. EXAM: ESOPHOGRAM/BARIUM  SWALLOW TECHNIQUE: Single contrast examination was performed using thin barium or water soluble. FLUOROSCOPY TIME:  Fluoroscopy Time:  0.7 minutes Radiation Exposure Index (if provided by the fluoroscopic device): 7.1 mGy COMPARISON:  None. FINDINGS: Limited single contrast imaging of the esophagus demonstrate no extravasation of contrast. Postoperative changes are noted in the proximal stomach related to recent Nissen fundoplication. Contrast traverses the fundoplication wrap. IMPRESSION: 1. Expected postoperative findings following Nissen fundoplication without evidence of postoperative leak or obstruction. Electronically Signed   By: Vinnie Langton M.D.   On: 01/29/2019 09:20    Anti-infectives: Anti-infectives (From admission, onward)   Start     Dose/Rate Route Frequency Ordered Stop   01/28/19 0645  ceFAZolin (ANCEF) IVPB 2g/100 mL premix     2 g 200 mL/hr over 30 Minutes Intravenous On call to O.R. 01/28/19 7829 01/28/19 0843      Assessment/Plan: s/p Procedure(s): XI ROBOTIC ASSISTED LAPAROSCOPIC NISSEN FUNDOPLICATION and REPAIR OF HIATAL HERNIA (N/A) POD 2 Dr Kae Heller  UGI normal, expected findings after repair  Had low O2 sats with ambulation. CT PE scan yesterday neg for PE. No significant pulm dis on CT either Breathing comfortably at rest  Cont pulm toilet, IS at 1500 now; discussed flutter valve Will see how she does with O2 sat with ambulation  Discussed eating techniques/issues after nissen Explained not uncommon to have neck, chest, upper abd discomfort after this type of surgery  Hopefully home later today  Leighton Ruff. Redmond Pulling, MD, FACS General, Bariatric, & Minimally Invasive Surgery [facility], [place]   LOS: 0 days    Greer Pickerel 01/30/2019

## 2019-01-30 NOTE — Progress Notes (Signed)
Nurse reports pt desat to around 78% while walking  Will ask pulm to eyeball just to make sure not missing something May need to go home with supplemental O2 D/w dr Halford Chessman.   Leighton Ruff. Redmond Pulling, MD, FACS General, Bariatric, & Minimally Invasive Surgery [facility], [place]

## 2019-01-31 ENCOUNTER — Inpatient Hospital Stay (HOSPITAL_COMMUNITY): Payer: Medicare Other

## 2019-01-31 LAB — BLOOD GAS, ARTERIAL
Acid-Base Excess: 7.7 mmol/L — ABNORMAL HIGH (ref 0.0–2.0)
Bicarbonate: 32 mmol/L — ABNORMAL HIGH (ref 20.0–28.0)
Drawn by: 308601
O2 Content: 1 L/min
O2 Saturation: 94.2 %
Patient temperature: 98.6
pCO2 arterial: 45.4 mmHg (ref 32.0–48.0)
pH, Arterial: 7.463 — ABNORMAL HIGH (ref 7.350–7.450)
pO2, Arterial: 71.5 mmHg — ABNORMAL LOW (ref 83.0–108.0)

## 2019-01-31 NOTE — Progress Notes (Signed)
PCCM PROGRESS NOTE  Subjective: Breathing better.  Walking in room w/o supplemental oxygen.  Not having cough, or chest discomfort.  Objective: BP (!) 159/76 (BP Location: Left Arm)   Pulse 77   Temp 97.9 F (36.6 C) (Oral)   Resp 16   Ht 5\' 1"  (1.549 m)   Wt 69 kg   SpO2 95%   BMI 28.76 kg/m   Physical exam: General - alert Eyes - pupils reactive ENT - no sinus tenderness, no stridor Cardiac - regular rate/rhythm, no murmur Chest - equal breath sounds b/l, no wheezing or rales Abdomen - soft, non tender, + bowel sounds Extremities - no cyanosis, clubbing, or edema Skin - no rashes Neuro - normal strength, moves extremities, follows commands Psych - normal mood and behavior  Sleep testing: PSG 01/04/98 - AHI 4, SpO2 low 86% MSLT 06/16/98 - mean sleep latency 2 minutes, 5/5 naps with sleep onset, 0 SOREM  Imaging: CT chest 01/29/19 - mild pneumomediastinum related to fundoplication, interstitial edema, subtle upper lobe emphysema, small b/l pleural effusions CXR 01/31/19 (reviewed by me) - improved lung aeration, decreased pleural effusions and interstitial edema  Labs: CMP Latest Ref Rng & Units 01/30/2019 01/29/2019 01/21/2019  Glucose 70 - 99 mg/dL 86 110(H) 98  BUN 8 - 23 mg/dL 15 15 14   Creatinine 0.44 - 1.00 mg/dL 0.77 0.75 0.83  Sodium 135 - 145 mmol/L 142 139 140  Potassium 3.5 - 5.1 mmol/L 4.1 3.3(L) 3.7  Chloride 98 - 111 mmol/L 107 101 99  CO2 22 - 32 mmol/L 29 27 31   Calcium 8.9 - 10.3 mg/dL 8.0(L) 8.0(L) 9.3  Total Protein 6.5 - 8.1 g/dL - - 7.8  Total Bilirubin 0.3 - 1.2 mg/dL - - 0.5  Alkaline Phos 38 - 126 U/L - - 111  AST 15 - 41 U/L - - 24  ALT 0 - 44 U/L - - 20   CBC Latest Ref Rng & Units 01/30/2019 01/29/2019 01/21/2019  WBC 4.0 - 10.5 K/uL 8.4 9.1 7.7  Hemoglobin 12.0 - 15.0 g/dL 9.8(L) 10.2(L) 12.3  Hematocrit 36.0 - 46.0 % 31.1(L) 32.1(L) 38.9  Platelets 150 - 400 K/uL 256 309 421(H)   ABG    Component Value Date/Time   PHART 7.463 (H)  01/31/2019 0310   PCO2ART 45.4 01/31/2019 0310   PO2ART 71.5 (L) 01/31/2019 0310   HCO3 32.0 (H) 01/31/2019 0310   O2SAT 94.2 01/31/2019 0310    Assessment/plan:  Hypoxia related to hypervolemia with interstitial edema and pleural effusions, and atelectasis. - much improved after diuresis - doesn't appear she will need home oxygen - ABG didn't show hypercapnia - continue bronchial hygiene and mobilization  History of tobacco abuse with subtle changes of emphysema on CT Chest. - will need to arrange for PFTs as outpt to further assess  History of obstructive sleep apnea with continued sleep difficulty and snoring. History of narcolepsy. - will need outpt sleep medicine follow up  Okay for discharge home.  Will have pulmonary office call to schedule follow up visit to assess for COPD, and f/u on OSA and narcolepsy.  Please call if additional help needed while she is in hospital.  Chesley Mires, MD [place] 01/31/2019, 10:12 AM

## 2019-01-31 NOTE — Progress Notes (Signed)
3 Days Post-Op   Subjective/Chief Complaint: She reports breathing is somewhat better after diuresis yesterday.  No SOB when sitting. O2 sats above 90 of oxygen at rest. Tolerating po   Objective: Vital signs in last 24 hours: Temp:  [97.9 F (36.6 C)-98.4 F (36.9 C)] 97.9 F (36.6 C) (07/04 0531) Pulse Rate:  [71-77] 77 (07/04 0531) Resp:  [16-18] 16 (07/04 0531) BP: (154-162)/(75-79) 159/76 (07/04 0531) SpO2:  [95 %-97 %] 95 % (07/04 0531) Weight:  [69 kg] 69 kg (07/03 1545) Last BM Date: 01/30/19  Intake/Output from previous day: 07/03 0701 - 07/04 0700 In: 800 [P.O.:800] Out: 2350 [Urine:2350] Intake/Output this shift: No intake/output data recorded.  Exam: Awake and alert Comfortable in appearance Lungs clear Abdomen soft with minimal tenderness Lab Results:  Recent Labs    01/29/19 0346 01/30/19 0313  WBC 9.1 8.4  HGB 10.2* 9.8*  HCT 32.1* 31.1*  PLT 309 256   BMET Recent Labs    01/29/19 0346 01/30/19 0313  NA 139 142  K 3.3* 4.1  CL 101 107  CO2 27 29  GLUCOSE 110* 86  BUN 15 15  CREATININE 0.75 0.77  CALCIUM 8.0* 8.0*   PT/INR No results for input(s): LABPROT, INR in the last 72 hours. ABG Recent Labs    01/31/19 0310  PHART 7.463*  HCO3 32.0*    Studies/Results: Ct Angio Chest Pe W Or Wo Contrast  Result Date: 01/29/2019 CLINICAL DATA:  68 year old female status post hernia repair and Nissen fundoplication postoperative day 1. Hypoxia. EXAM: CT ANGIOGRAPHY CHEST WITH CONTRAST TECHNIQUE: Multidetector CT imaging of the chest was performed using the standard protocol during bolus administration of intravenous contrast. Multiplanar CT image reconstructions and MIPs were obtained to evaluate the vascular anatomy. CONTRAST:  145mL OMNIPAQUE IOHEXOL 350 MG/ML SOLN COMPARISON:  Portable chest earlier today. FINDINGS: Cardiovascular: Good contrast bolus timing in the pulmonary arterial tree. Mild respiratory motion. Some subsegmental artery  detail is obscured. No focal filling defect identified in the pulmonary arteries to suggest acute pulmonary embolism. Small volume pneumomediastinum is likely postoperative. Calcified aortic atherosclerosis. No pericardial effusion. Mediastinum/Nodes: Soft tissue gas at the thoracic inlet in conjunction with the pneumomediastinum. No mediastinal hematoma or lymphadenopathy. Lungs/Pleura: Major airways are patent. Trace layering pleural effusions. Mild lung base atelectasis. No pneumothorax or pulmonary consolidation. Upper Abdomen: Postoperative changes to the gastroesophageal junction with mild regional soft tissue edema. No free fluid in the upper abdomen. Negative visible liver, gallbladder, spleen, pancreas, adrenal glands and kidneys. There is oral contrast at the splenic flexure which appears unremarkable. Musculoskeletal: No acute osseous abnormality identified. Review of the MIP images confirms the above findings. IMPRESSION: 1. Negative for acute pulmonary embolus. 2. Postoperative changes from fundoplication including small volume pneumomediastinum and gastroesophageal region regional soft tissue edema. 3. Trace layering pleural effusions. Mild lung base atelectasis. 4. Aortic Atherosclerosis (ICD10-I70.0). Electronically Signed   By: Genevie Ann M.D.   On: 01/29/2019 17:43   Dg Chest Port 1 View  Result Date: 01/29/2019 CLINICAL DATA:  Hypoxia EXAM: PORTABLE CHEST 1 VIEW COMPARISON:  01/21/2019 FINDINGS: Heart size is normal. I think there is mild basilar atelectasis left more than right. Upper lungs are clear. No evidence pulmonary edema. No bone abnormality. IMPRESSION: Slight worsening of basilar atelectasis left more than right Electronically Signed   By: Nelson Chimes M.D.   On: 01/29/2019 14:07   Dg Esophagus W Single Cm (sol Or Thin Ba)  Result Date: 01/29/2019 CLINICAL DATA:  68 year old female  status post Nissen fundoplication. EXAM: ESOPHOGRAM/BARIUM SWALLOW TECHNIQUE: Single contrast  examination was performed using thin barium or water soluble. FLUOROSCOPY TIME:  Fluoroscopy Time:  0.7 minutes Radiation Exposure Index (if provided by the fluoroscopic device): 7.1 mGy COMPARISON:  None. FINDINGS: Limited single contrast imaging of the esophagus demonstrate no extravasation of contrast. Postoperative changes are noted in the proximal stomach related to recent Nissen fundoplication. Contrast traverses the fundoplication wrap. IMPRESSION: 1. Expected postoperative findings following Nissen fundoplication without evidence of postoperative leak or obstruction. Electronically Signed   By: Vinnie Langton M.D.   On: 01/29/2019 09:20    Anti-infectives: Anti-infectives (From admission, onward)   Start     Dose/Rate Route Frequency Ordered Stop   01/28/19 0645  ceFAZolin (ANCEF) IVPB 2g/100 mL premix     2 g 200 mL/hr over 30 Minutes Intravenous On call to O.R. 01/28/19 5784 01/28/19 0843      Assessment/Plan: s/p Procedure(s): XI ROBOTIC ASSISTED LAPAROSCOPIC NISSEN FUNDOPLICATION and REPAIR OF HIATAL HERNIA (N/A)   Blood gas noted and Pulmonary consult appreciated.  Home potentially today if ok with CCM.    LOS: 1 day    [name], MD FACS 01/31/2019

## 2019-01-31 NOTE — Progress Notes (Signed)
Pt is resting over night. No acute distress noted.  2138: Patient Saturations on 3 Liters of oxygen  At resting in bed-97% 2308: Patient Saturations on 2 Liters of oxygen at resting in bed 97% 0531: Patient Saturations on 1 Liters of oxygen at resting in bed 95%

## 2019-02-01 NOTE — Discharge Summary (Signed)
Physician Discharge Summary  Robyn Ashley DQQ:[id number] DOB: [birth year] DOA: 01/28/2019  PCP: Redmond School, MD  Admit date: 01/28/2019 Discharge date: 01/31/2019  Recommendations for Outpatient Follow-up:    Follow-up Information    Clovis Riley, MD Follow up in 3 week(s).   Specialty: General Surgery Contact information: [address] [place] [address] [phone]          Discharge Diagnoses:  1. Hiatal hernia with GERD 2. Hypoxia related to hypervolemia with interstitial edema and pleural effusions, and atelectasis - resolved 3. 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 She has been dealing with this for many years, and while it was initially well controlled with medication, in the last year or so her symptoms have become somewhat difficult to control. She describes heartburn, chest pain, postprandial belching and early satiety. She describes a sensation that food sticks in her mid chest and occasionally in her throat. She also describes atypical cough and frequent throat clearing. She states that she sleeps stacked on pillows and is already doing frequent small meals. Her symptoms worsened significantly if she misses a dose of her antacid. The sensation of sticking and pain is regardless of whether she takes in liquid or solid foods. History of intermittent diarrhea (up to 6-7 times daily) and lower abdominal pain relieved only after bowel movements.   Hospital Course:  The patient was admitted by Dr. Kae Heller for planned robotic repair of her hiatal hernia and  Nissen fundoplication.  Postoperative day 1 swallow look good without leak or obstruction and patient was continued on a diet.  She tolerated this well.  However the patient became hypoxic while ambulating require oxygen supplementation.  She would drop her sats into the 80s and upper 70s while ambulating.  Her breathing was nonlabored while resting.  CT PE scan was performed which was unremarkable for PE.  Because of her ongoing need for oxygen while ambulating pulmonary medicine was consulted.  They saw her in consultation.  On the day of discharge she was doing better.  She had been diuresed.  She was no longer requiring oxygen on ambulation.  They felt that she was stable for discharge.  She had been tolerating a pured diet.  I had previously discussed discharge instructions with the patient.  Dr. Ninfa Linden rounded on her the day of discharge   Discharge Instructions  Discharge Instructions    Call MD for:   Complete by: As directed    Temperature >101   Call MD for:  persistant dizziness or light-headedness   Complete by: As directed    Call MD for:  persistant nausea and vomiting   Complete by: As directed    Call MD for:  redness, tenderness, or signs of infection (pain, swelling, redness, odor or green/yellow discharge around incision site)   Complete by: As directed    Call MD for:  severe uncontrolled pain   Complete by: As directed    Diet full liquid   Complete by: As directed    Discharge instructions   Complete by: As directed    See attached discharge instructions  Increase activity slowly   Complete by: As directed      Allergies as of 01/31/2019      Reactions   Tetracyclines & Related Other (See Comments)   Pt developed gastritis while taking tetracycline   Aspirin    bruises   Norvasc [amlodipine Besylate]    Made BP drop low   Nsaids    bruising   Dexilant [dexlansoprazole] Diarrhea      Medication List    TAKE these medications   acetaminophen 650 MG CR  tablet Commonly known as: TYLENOL Take 1,300 mg by mouth every 8 (eight) hours as needed for pain.   atorvastatin 20 MG tablet Commonly known as: LIPITOR Take 20 mg by mouth at bedtime.   CALCIUM 600+D PO Take 1 tablet by mouth daily.   cholecalciferol 25 MCG (1000 UT) tablet Commonly known as: VITAMIN D3 Take 1,000 Units by mouth daily.   Co Q10 100 MG Caps Take 100 mg by mouth daily.   dicyclomine 10 MG capsule Commonly known as: BENTYL TAKE 1 CAPSULE(10 MG) BY MOUTH FOUR TIMES DAILY AS NEEDED FOR SPASMS OR ABDOMINAL PAIN What changed: See the new instructions.   docusate sodium 100 MG capsule Commonly known as: COLACE Take 1 capsule (100 mg total) by mouth 2 (two) times daily. Notes to patient: 01/31/2019   enalapril-hydrochlorothiazide 10-25 MG tablet Commonly known as: VASERETIC Take 1 tablet by mouth daily. Notes to patient: Your BP was running low in the hospital. Check your blood pressure at home before you take this medication. You may need to stay off this medication for a few days. Follow up with your primary care doctor if BP continues to run low.    FLUoxetine 10 MG capsule Commonly known as: PROZAC Take 10 mg by mouth daily. Notes to patient: 02/01/2019   levothyroxine 50 MCG tablet Commonly known as: SYNTHROID Take 50 mcg by mouth daily. Notes to patient: 02/01/2019   methylphenidate 20 MG tablet Commonly known as: RITALIN Take 20 mg by mouth 3 (three) times daily.   metoprolol succinate 50 MG 24 hr tablet Commonly known as: TOPROL-XL Take 100 mg by mouth at bedtime. Notes to patient: Your BP was running low in the hospital. Check your blood pressure at home before you take this medication. You may need to stay off this medication for a few days. Follow up with your primary care doctor if BP continues to run low, you may need a lower dose of this medication.    multivitamin tablet Take 1 tablet by mouth daily.   ondansetron 4 MG disintegrating  tablet Commonly known as: ZOFRAN-ODT Take 1 tablet (4 mg total) by mouth every 6 (six) hours as needed for nausea.   pantoprazole 40 MG tablet Commonly known as: PROTONIX TAKE 1 TABLET(40 MG) BY MOUTH DAILY What changed: See the new instructions. Notes to patient: After the first few days home, you can start tapering off this medication- can take half the dose or take every other day etc    phenylephrine 10 MG Tabs tablet Commonly known as: SUDAFED PE Take 10 mg by mouth every 6 (six) hours as needed (allergies).   traMADol 50 MG tablet Commonly known as: ULTRAM Take 1 tablet (50 mg total) by mouth every 6 (six) hours as needed (Use for pain not controlled by tylenol.).      Follow-up Information    Clovis Riley, MD Follow up in 3 week(s).   Specialty: General Surgery Contact information: [address]  [address] [place] [address] [phone]            The results of significant diagnostics from this hospitalization (including imaging, microbiology, ancillary and laboratory) are listed below for reference.    Significant Diagnostic Studies: Dg Chest 2 View  Result Date: 01/31/2019 CLINICAL DATA:  Shortness of breath. EXAM: CHEST - 2 VIEW COMPARISON:  CT scan and radiograph of January 29, 2019. FINDINGS: The heart size and mediastinal contours are within normal limits. No pneumothorax is noted. Small bilateral pleural effusions are noted with minimal adjacent subsegmental atelectasis. The visualized skeletal structures are unremarkable. IMPRESSION: Minimal bibasilar subsegmental atelectasis is noted with small bilateral pleural effusions. Aortic Atherosclerosis (ICD10-I70.0). Electronically Signed   By: Marijo Conception M.D.   On: 01/31/2019 10:55   Chest 2 View  Result Date: 01/21/2019 CLINICAL DATA:  Preop hiatal hernia ex smoker EXAM: CHEST - 2 VIEW COMPARISON:  07/17/2013 FINDINGS: Minimal atelectasis or scar at the lingula. No acute opacity or pleural  effusion. Normal cardiomediastinal silhouette. No pneumothorax. IMPRESSION: No active cardiopulmonary disease. Electronically Signed   By: Donavan Foil M.D.   On: 01/21/2019 21:59   Ct Angio Chest Pe W Or Wo Contrast  Result Date: 01/29/2019 CLINICAL DATA:  68 year old female status post hernia repair and Nissen fundoplication postoperative day 1. Hypoxia. EXAM: CT ANGIOGRAPHY CHEST WITH CONTRAST TECHNIQUE: Multidetector CT imaging of the chest was performed using the standard protocol during bolus administration of intravenous contrast. Multiplanar CT image reconstructions and MIPs were obtained to evaluate the vascular anatomy. CONTRAST:  173mL OMNIPAQUE IOHEXOL 350 MG/ML SOLN COMPARISON:  Portable chest earlier today. FINDINGS: Cardiovascular: Good contrast bolus timing in the pulmonary arterial tree. Mild respiratory motion. Some subsegmental artery detail is obscured. No focal filling defect identified in the pulmonary arteries to suggest acute pulmonary embolism. Small volume pneumomediastinum is likely postoperative. Calcified aortic atherosclerosis. No pericardial effusion. Mediastinum/Nodes: Soft tissue gas at the thoracic inlet in conjunction with the pneumomediastinum. No mediastinal hematoma or lymphadenopathy. Lungs/Pleura: Major airways are patent. Trace layering pleural effusions. Mild lung base atelectasis. No pneumothorax or pulmonary consolidation. Upper Abdomen: Postoperative changes to the gastroesophageal junction with mild regional soft tissue edema. No free fluid in the upper abdomen. Negative visible liver, gallbladder, spleen, pancreas, adrenal glands and kidneys. There is oral contrast at the splenic flexure which appears unremarkable. Musculoskeletal: No acute osseous abnormality identified. Review of the MIP images confirms the above findings. IMPRESSION: 1. Negative for acute pulmonary embolus. 2. Postoperative changes from fundoplication including small volume pneumomediastinum and  gastroesophageal region regional soft tissue edema. 3. Trace layering pleural effusions. Mild lung base atelectasis. 4. Aortic Atherosclerosis (ICD10-I70.0). Electronically Signed   By: Genevie Ann M.D.   On: 01/29/2019 17:43   Dg Chest Port 1 View  Result Date: 01/29/2019 CLINICAL DATA:  Hypoxia EXAM: PORTABLE CHEST 1 VIEW COMPARISON:  01/21/2019 FINDINGS: Heart size is normal. I think there is mild basilar atelectasis left more than right. Upper lungs are clear. No evidence pulmonary edema. No bone abnormality. IMPRESSION: Slight worsening of basilar atelectasis left more than right Electronically Signed   By: Nelson Chimes M.D.   On: 01/29/2019 14:07   Dg Esophagus W Single Cm (sol Or Thin Ba)  Result Date: 01/29/2019 CLINICAL DATA:  68 year old female status post Nissen fundoplication. EXAM: ESOPHOGRAM/BARIUM SWALLOW TECHNIQUE: Single contrast examination was performed using thin barium or water soluble. FLUOROSCOPY TIME:  Fluoroscopy Time:  0.7 minutes Radiation Exposure Index (if provided by the fluoroscopic  device): 7.1 mGy COMPARISON:  None. FINDINGS: Limited single contrast imaging of the esophagus demonstrate no extravasation of contrast. Postoperative changes are noted in the proximal stomach related to recent Nissen fundoplication. Contrast traverses the fundoplication wrap. IMPRESSION: 1. Expected postoperative findings following Nissen fundoplication without evidence of postoperative leak or obstruction. Electronically Signed   By: Vinnie Langton M.D.   On: 01/29/2019 09:20    Microbiology: Recent Results (from the past 240 hour(s))  SARS Coronavirus 2 (Performed in [place] hospital lab)     Status: None   Collection Time: 01/24/19  3:46 PM   Specimen: Nasal Swab  Result Value Ref Range Status   SARS Coronavirus 2 NEGATIVE NEGATIVE Final    Comment: (NOTE) SARS-CoV-2 target nucleic acids are NOT DETECTED. The SARS-CoV-2 RNA is generally detectable in upper and lower respiratory  specimens during the acute phase of infection. Negative results do not preclude SARS-CoV-2 infection, do not rule out co-infections with other pathogens, and should not be used as the sole basis for treatment or other patient management decisions. Negative results must be combined with clinical observations, patient history, and epidemiological information. The expected result is Negative. Fact Sheet for Patients: SugarRoll.be Fact Sheet for Healthcare Providers: https://www.woods-mathews.com/ This test is not yet approved or cleared by the [place] FDA and  has been authorized for detection and/or diagnosis of SARS-CoV-2 by FDA under an Emergency Use Authorization (EUA). This EUA will remain  in effect (meaning this test can be used) for the duration of the COVID-19 declaration under Section 56 4(b)(1) of the Act, 21 U.S.C. section 360bbb-3(b)(1), unless the authorization is terminated or revoked sooner. Performed at [facility], [place] [address], [place], [place] [address]      Labs: Basic Metabolic Panel: Recent Labs  Lab 01/29/19 0346 01/30/19 0313  NA 139 142  K 3.3* 4.1  CL 101 107  CO2 27 29  GLUCOSE 110* 86  BUN 15 15  CREATININE 0.75 0.77  CALCIUM 8.0* 8.0*  MG 1.5* 2.2   Liver Function Tests: No results for input(s): AST, ALT, ALKPHOS, BILITOT, PROT, ALBUMIN in the last 168 hours. No results for input(s): LIPASE, AMYLASE in the last 168 hours. No results for input(s): AMMONIA in the last 168 hours. CBC: Recent Labs  Lab 01/29/19 0346 01/30/19 0313  WBC 9.1 8.4  HGB 10.2* 9.8*  HCT 32.1* 31.1*  MCV 92.2 94.8  PLT 309 256   Cardiac Enzymes: No results for input(s): CKTOTAL, CKMB, CKMBINDEX, TROPONINI in the last 168 hours. BNP: BNP (last 3 results) No results for input(s): BNP in the last 8760 hours.  ProBNP (last 3 results) No results for input(s): PROBNP in the last 8760 hours.  CBG: No  results for input(s): GLUCAP in the last 168 hours.  Active Problems:   Hiatal hernia with GERD without esophagitis   Time coordinating discharge: 15 min  Signed:  Gayland Curry, MD [facility], [place] [phone] 02/01/2019, 9:27 AM

## 2019-02-02 NOTE — Telephone Encounter (Signed)
Donita Brooks, NP  [facility] Triage Pool 3 days ago   Please arrange a sleep follow up with Dr. Halford Chessman for sleep and call the patient.    Routing comment     Called spoke with patient and scheduled [facility] appt with Beth NP for 7.10.2020 @ 1330.  Pt is aware about the visitor and mask policies.  Pt is aware she will receive a call the day prior for COVID screening.  Nothing further needed at this time; will sign off.

## 2019-02-04 NOTE — Telephone Encounter (Signed)
Spoke with pt. She doesn't feel the Bentyl has been that effective. Pt currently isn't taking Bentyl x 1 week s/p her surgery. She doesn't want to take a lot of medications right now due to her surgery. She is going to try to d/c Protonix soon if she's not having any problems.

## 2019-02-04 NOTE — Telephone Encounter (Signed)
Noted. Pt will call back when needed.

## 2019-02-04 NOTE — Telephone Encounter (Signed)
Please ask the patient if Bentyl was effective for her? Her pharmacy recommended Lotronex, but there are potential complications with this medication and I'd prefer to find an alternative.

## 2019-02-04 NOTE — Telephone Encounter (Signed)
Noted. Call with progress report or any problems.

## 2019-02-06 ENCOUNTER — Other Ambulatory Visit: Payer: Self-pay

## 2019-02-06 ENCOUNTER — Ambulatory Visit (INDEPENDENT_AMBULATORY_CARE_PROVIDER_SITE_OTHER): Payer: Medicare Other | Admitting: Primary Care

## 2019-02-06 ENCOUNTER — Other Ambulatory Visit (HOSPITAL_BASED_OUTPATIENT_CLINIC_OR_DEPARTMENT_OTHER): Payer: Self-pay

## 2019-02-06 ENCOUNTER — Encounter: Payer: Self-pay | Admitting: Primary Care

## 2019-02-06 VITALS — BP 112/60 | HR 57 | Temp 97.7°F | Ht 61.5 in | Wt 147.6 lb

## 2019-02-06 DIAGNOSIS — J9601 Acute respiratory failure with hypoxia: Secondary | ICD-10-CM | POA: Insufficient documentation

## 2019-02-06 DIAGNOSIS — Z87891 Personal history of nicotine dependence: Secondary | ICD-10-CM

## 2019-02-06 DIAGNOSIS — Z8669 Personal history of other diseases of the nervous system and sense organs: Secondary | ICD-10-CM | POA: Diagnosis not present

## 2019-02-06 DIAGNOSIS — J438 Other emphysema: Secondary | ICD-10-CM | POA: Diagnosis not present

## 2019-02-06 NOTE — Progress Notes (Signed)
@Patient  ID: Robyn Ashley, female    DOB: 1951-03-18, 68 y.o.   MRN: [id number]  Chief Complaint  Patient presents with   Hospital Follow Up    Was recently diagnosed from hospital due to surgery. Hasn't had a SS in about 23 years. Is not currently using a CPAP.     Referring provider: Redmond School, MD  HPI: 68 year old female, former smoker quit 2000 (30 pack year hx). PMH significant for OSA (not on CPAP), narcolepsy, GERD, hiatal hernia. Previously seen by Dr. Annamaria Boots many years ago, plans on changing to Dr. Halford Chessman. NPSG 01/04/1998 AHI 4, 75% REM and Sp02 low 86%. Impression severe daytime hypersomnolence. Recent hospitalization for hernia repair complicated by hypoxia related to hypervolemia.   Hospital course 01/28/19-01/31/19: Patient was admitted by Dr. Kae Heller for planned robotic repair of hiatal hernia and nissen fundoplication. Tolerate procedure well, however, became hypoxic while ambulating requiring oxygen. CT unremarkable for PE. Pulmonary consulted. CXR 7/2 showed mild pneumomediastinum related to fundoplication, interstitial edema, subtle upper lobe emphysema, small bilateral pleural effusion. She was diuresed. Repeat CXR 7/4 showed improved lung aeration, decreased pleural effusion and interstitial edema. Discharged home without oxygen. Needs PFTs and sleep study.   02/06/2019  Patient presents today for hospital follow-up. She is feeling well. No breathing difficulties or shortness of breath since discharge. States her O2 level has been 96% on room air at rest. She has limited appetite but she is eating soft textured foods. Seeing general surgery next week. She has not used CPAP in over 15-20 years. Reports moderate-severe daytime fatigue/somnolence. Family says that she snores. Occasionally wakes up a few times at night. She is open to having repeat sleep study and possibly going back on CPAP therapy if needed. Epworth score 16. She is unsure if she has ever had PFTs, no known hx  COPD but had evidence of mild emphysema on CXR. Denies weight gain, leg swelling, sob, wheezing or significant cough.  Smoking hx: Quit 17-20 years ago (age [age]) Started smoking 18  Average 1-2 ppd  Sleep scheduled: Bed time- 10:30/11pm, typically falls asleep quickly  [facility]- 8:30/9am   Allergies  Allergen Reactions   Tetracyclines & Related Other (See Comments)    Pt developed gastritis while taking tetracycline   Aspirin     bruises   Norvasc [Amlodipine Besylate]     Made BP drop low   Nsaids     bruising   Dexilant [Dexlansoprazole] Diarrhea     There is no immunization history on file for this patient.  Past Medical History:  Diagnosis Date   GERD (gastroesophageal reflux disease)    Hiatal hernia    History of degenerative disc disease    History of nuclear stress test 07/27/2013   in care everywhere per cardiology note by dr Donata Clay:  test normal   Hypertension    Intermittent palpitations    MVP (mitral valve prolapse)    per pt dx 1980s,  last echo done 2016 by dr Donata Clay [facility] in [place]) and last cardiology visit 11-02-2014 in epic;   01-27-2019 per pt episodic palpitations and takes toprol, currently followed by pcp   Narcolepsy    OSA (obstructive sleep apnea)    Not on CPAP    Tobacco History: Social History   Tobacco Use  Smoking Status Former Smoker   Packs/day: 1.50   Types: Cigarettes   Start date: 1970   Quit date: 2020   Years since quitting: 0.5  Smokeless Tobacco  Never Used   Counseling given: Not Answered   Outpatient Medications Prior to Visit  Medication Sig Dispense Refill   acetaminophen (TYLENOL) 650 MG CR tablet Take 1,300 mg by mouth every 8 (eight) hours as needed for pain.      atorvastatin (LIPITOR) 20 MG tablet Take 20 mg by mouth at bedtime.      Calcium Carbonate-Vitamin D (CALCIUM 600+D PO) Take 1 tablet by mouth daily.     cholecalciferol (VITAMIN D3) 25 MCG (1000 UT) tablet Take 1,000  Units by mouth daily.     Coenzyme Q10 (CO Q10) 100 MG CAPS Take 100 mg by mouth daily.      dicyclomine (BENTYL) 10 MG capsule TAKE 1 CAPSULE(10 MG) BY MOUTH FOUR TIMES DAILY AS NEEDED FOR SPASMS OR ABDOMINAL PAIN (Patient taking differently: Take 10 mg by mouth 4 (four) times daily as needed for spasms. ) 90 capsule 1   docusate sodium (COLACE) 100 MG capsule Take 1 capsule (100 mg total) by mouth 2 (two) times daily. 10 capsule 0   enalapril-hydrochlorothiazide (VASERETIC) 10-25 MG tablet Take 1 tablet by mouth daily.  1   FLUoxetine (PROZAC) 10 MG capsule Take 10 mg by mouth daily.   0   levothyroxine (SYNTHROID, LEVOTHROID) 50 MCG tablet Take 50 mcg by mouth daily.   2   methylphenidate (RITALIN) 20 MG tablet Take 20 mg by mouth 3 (three) times daily.   0   metoprolol succinate (TOPROL-XL) 50 MG 24 hr tablet Take 100 mg by mouth at bedtime.   10   Multiple Vitamin (MULTIVITAMIN) tablet Take 1 tablet by mouth daily.     pantoprazole (PROTONIX) 40 MG tablet TAKE 1 TABLET(40 MG) BY MOUTH DAILY (Patient taking differently: Take 40 mg by mouth daily. ) 30 tablet 11   phenylephrine (SUDAFED PE) 10 MG TABS tablet Take 10 mg by mouth every 6 (six) hours as needed (allergies).      valACYclovir (VALTREX) 1000 MG tablet Take 1,000 mg by mouth 2 (two) times daily.     traMADol (ULTRAM) 50 MG tablet Take 1 tablet (50 mg total) by mouth every 6 (six) hours as needed (Use for pain not controlled by tylenol.). 10 tablet 0   ondansetron (ZOFRAN-ODT) 4 MG disintegrating tablet Take 1 tablet (4 mg total) by mouth every 6 (six) hours as needed for nausea. 20 tablet 2   No facility-administered medications prior to visit.    Review of Systems  Review of Systems  Constitutional: Negative for appetite change and fever.  HENT: Negative.   Respiratory: Negative for cough, chest tightness, shortness of breath and wheezing.   Cardiovascular: Negative.  Negative for chest pain and leg swelling.    Gastrointestinal: Negative for abdominal pain, nausea and vomiting.   Physical Exam  BP 112/60 (BP Location: Left Arm, Patient Position: Sitting, Cuff Size: Normal)    Pulse (!) 57    Temp 97.7 F (36.5 C) (Oral)    Ht 5' 1.5" (1.562 m)    Wt 147 lb 9.6 oz (67 kg)    SpO2 98%    BMI 27.44 kg/m  Physical Exam Constitutional:      General: She is not in acute distress.    Appearance: Normal appearance. She is not ill-appearing.  HENT:     Head: Normocephalic and atraumatic.     Nose: Nose normal.  Eyes:     Pupils: Pupils are equal, round, and reactive to light.  Neck:     Musculoskeletal: Normal  range of motion and neck supple.  Pulmonary:     Effort: Pulmonary effort is normal. No respiratory distress.     Breath sounds: No wheezing or rhonchi.     Comments: Faint pleural rub bilateral bases R>L Musculoskeletal: Normal range of motion.  Skin:    General: Skin is warm and dry.  Neurological:     General: No focal deficit present.     Mental Status: She is alert and oriented to person, place, and time. Mental status is at baseline.  Psychiatric:        Mood and Affect: Mood normal.        Behavior: Behavior normal.        Thought Content: Thought content normal.        Judgment: Judgment normal.      Lab Results:  CBC    Component Value Date/Time   WBC 8.4 01/30/2019 0313   RBC 3.28 (L) 01/30/2019 0313   HGB 9.8 (L) 01/30/2019 0313   HCT 31.1 (L) 01/30/2019 0313   PLT 256 01/30/2019 0313   MCV 94.8 01/30/2019 0313   MCH 29.9 01/30/2019 0313   MCHC 31.5 01/30/2019 0313   RDW 14.3 01/30/2019 0313   LYMPHSABS 1.2 01/21/2019 1157   MONOABS 0.7 01/21/2019 1157   EOSABS 0.3 01/21/2019 1157   BASOSABS 0.1 01/21/2019 1157    BMET    Component Value Date/Time   NA 142 01/30/2019 0313   K 4.1 01/30/2019 0313   CL 107 01/30/2019 0313   CO2 29 01/30/2019 0313   GLUCOSE 86 01/30/2019 0313   BUN 15 01/30/2019 0313   CREATININE 0.77 01/30/2019 0313   CALCIUM 8.0 (L)  01/30/2019 0313   GFRNONAA >60 01/30/2019 0313   GFRAA >60 01/30/2019 0313    BNP No results found for: BNP  ProBNP No results found for: PROBNP  Imaging: Dg Chest 2 View  Result Date: 01/31/2019 CLINICAL DATA:  Shortness of breath. EXAM: CHEST - 2 VIEW COMPARISON:  CT scan and radiograph of January 29, 2019. FINDINGS: The heart size and mediastinal contours are within normal limits. No pneumothorax is noted. Small bilateral pleural effusions are noted with minimal adjacent subsegmental atelectasis. The visualized skeletal structures are unremarkable. IMPRESSION: Minimal bibasilar subsegmental atelectasis is noted with small bilateral pleural effusions. Aortic Atherosclerosis (ICD10-I70.0). Electronically Signed   By: Marijo Conception M.D.   On: 01/31/2019 10:55   Chest 2 View  Result Date: 01/21/2019 CLINICAL DATA:  Preop hiatal hernia ex smoker EXAM: CHEST - 2 VIEW COMPARISON:  07/17/2013 FINDINGS: Minimal atelectasis or scar at the lingula. No acute opacity or pleural effusion. Normal cardiomediastinal silhouette. No pneumothorax. IMPRESSION: No active cardiopulmonary disease. Electronically Signed   By: Donavan Foil M.D.   On: 01/21/2019 21:59   Ct Angio Chest Pe W Or Wo Contrast  Result Date: 01/29/2019 CLINICAL DATA:  68 year old female status post hernia repair and Nissen fundoplication postoperative day 1. Hypoxia. EXAM: CT ANGIOGRAPHY CHEST WITH CONTRAST TECHNIQUE: Multidetector CT imaging of the chest was performed using the standard protocol during bolus administration of intravenous contrast. Multiplanar CT image reconstructions and MIPs were obtained to evaluate the vascular anatomy. CONTRAST:  129mL OMNIPAQUE IOHEXOL 350 MG/ML SOLN COMPARISON:  Portable chest earlier today. FINDINGS: Cardiovascular: Good contrast bolus timing in the pulmonary arterial tree. Mild respiratory motion. Some subsegmental artery detail is obscured. No focal filling defect identified in the pulmonary arteries  to suggest acute pulmonary embolism. Small volume pneumomediastinum is likely postoperative. Calcified aortic  atherosclerosis. No pericardial effusion. Mediastinum/Nodes: Soft tissue gas at the thoracic inlet in conjunction with the pneumomediastinum. No mediastinal hematoma or lymphadenopathy. Lungs/Pleura: Major airways are patent. Trace layering pleural effusions. Mild lung base atelectasis. No pneumothorax or pulmonary consolidation. Upper Abdomen: Postoperative changes to the gastroesophageal junction with mild regional soft tissue edema. No free fluid in the upper abdomen. Negative visible liver, gallbladder, spleen, pancreas, adrenal glands and kidneys. There is oral contrast at the splenic flexure which appears unremarkable. Musculoskeletal: No acute osseous abnormality identified. Review of the MIP images confirms the above findings. IMPRESSION: 1. Negative for acute pulmonary embolus. 2. Postoperative changes from fundoplication including small volume pneumomediastinum and gastroesophageal region regional soft tissue edema. 3. Trace layering pleural effusions. Mild lung base atelectasis. 4. Aortic Atherosclerosis (ICD10-I70.0). Electronically Signed   By: Genevie Ann M.D.   On: 01/29/2019 17:43   Dg Chest Port 1 View  Result Date: 01/29/2019 CLINICAL DATA:  Hypoxia EXAM: PORTABLE CHEST 1 VIEW COMPARISON:  01/21/2019 FINDINGS: Heart size is normal. I think there is mild basilar atelectasis left more than right. Upper lungs are clear. No evidence pulmonary edema. No bone abnormality. IMPRESSION: Slight worsening of basilar atelectasis left more than right Electronically Signed   By: Nelson Chimes M.D.   On: 01/29/2019 14:07   Dg Esophagus W Single Cm (sol Or Thin Ba)  Result Date: 01/29/2019 CLINICAL DATA:  68 year old female status post Nissen fundoplication. EXAM: ESOPHOGRAM/BARIUM SWALLOW TECHNIQUE: Single contrast examination was performed using thin barium or water soluble. FLUOROSCOPY TIME:   Fluoroscopy Time:  0.7 minutes Radiation Exposure Index (if provided by the fluoroscopic device): 7.1 mGy COMPARISON:  None. FINDINGS: Limited single contrast imaging of the esophagus demonstrate no extravasation of contrast. Postoperative changes are noted in the proximal stomach related to recent Nissen fundoplication. Contrast traverses the fundoplication wrap. IMPRESSION: 1. Expected postoperative findings following Nissen fundoplication without evidence of postoperative leak or obstruction. Electronically Signed   By: Vinnie Langton M.D.   On: 01/29/2019 09:20     Assessment & Plan:   Hx of sleep apnea - Hx OSA, narcolepsy and snoring - Has not used CPAP in 15-20 years  - Epworth score today 16 - Needs split night sleep study - Patient is open to resuming CPAP therapy if needed   Other emphysema ([place]) - Former smoker, 30+ pack year hx - Asymptomatic - CXR 7/2 showed subtle upper lobe emphysema  - Needs PFTs  Acute respiratory failure with hypoxia ([place]) - Resolved, d/t fluid overload while in-patient for hernia repair - Repeat CXR 7/4 showed improved lung aeration, decreased pleural effusion and interstitial edema - LS with faint pleural rub bilaterally on exam today - Amb O2 96- 99% RA x3 laps  - Patient declined repeat CXR - will notify if becomes sob or O2 level <90 - Encourage deep breathing exercises and flutter valve  - Denies weight gain, leg swelling or shortness of breath    FU after sleep study and PFTs with Dr. Almedia Balls, NP 02/06/2019

## 2019-02-06 NOTE — Assessment & Plan Note (Addendum)
-   Former smoker, 30+ pack year hx - Asymptomatic - CXR 7/2 showed subtle upper lobe emphysema  - Needs PFTs

## 2019-02-06 NOTE — Patient Instructions (Signed)
Orders: - Split night sleep study - PFTs in 2-8 weeks   Follow-up: - Dr. Halford Chessman after PFTs and sleep study

## 2019-02-06 NOTE — Assessment & Plan Note (Signed)
-   Hx OSA, narcolepsy and snoring - Has not used CPAP in 15-20 years  - Epworth score today 16 - Needs split night sleep study - Patient is open to resuming CPAP therapy if needed

## 2019-02-06 NOTE — Assessment & Plan Note (Addendum)
-   Resolved, d/t fluid overload while in-patient for hernia repair - Repeat CXR 7/4 showed improved lung aeration, decreased pleural effusion and interstitial edema - LS with faint pleural rub bilaterally on exam today - Amb O2 96- 99% RA x3 laps  - Patient declined repeat CXR - will notify if becomes sob or O2 level <90 - Encourage deep breathing exercises and flutter valve  - Denies weight gain, leg swelling or shortness of breath

## 2019-02-16 ENCOUNTER — Other Ambulatory Visit: Payer: Self-pay

## 2019-02-16 ENCOUNTER — Other Ambulatory Visit (HOSPITAL_COMMUNITY)
Admission: RE | Admit: 2019-02-16 | Discharge: 2019-02-16 | Disposition: A | Discharge status: Home / Self Care | Payer: Medicare Other | Source: Ambulatory Visit | Attending: Pulmonary Disease | Admitting: Pulmonary Disease

## 2019-02-16 DIAGNOSIS — Z1159 Encounter for screening for other viral diseases: Secondary | ICD-10-CM | POA: Diagnosis not present

## 2019-02-17 LAB — SARS CORONAVIRUS 2 (TAT 6-24 HRS): SARS Coronavirus 2: NEGATIVE

## 2019-02-19 ENCOUNTER — Ambulatory Visit: Payer: Medicare Other | Attending: Primary Care | Admitting: Pulmonary Disease

## 2019-02-19 ENCOUNTER — Other Ambulatory Visit: Payer: Self-pay

## 2019-02-19 DIAGNOSIS — Z8669 Personal history of other diseases of the nervous system and sense organs: Secondary | ICD-10-CM

## 2019-02-19 DIAGNOSIS — R0683 Snoring: Secondary | ICD-10-CM

## 2019-02-19 DIAGNOSIS — G4733 Obstructive sleep apnea (adult) (pediatric): Secondary | ICD-10-CM

## 2019-03-03 ENCOUNTER — Telehealth: Payer: Self-pay | Admitting: Pulmonary Disease

## 2019-03-03 NOTE — Procedures (Signed)
    Patient Name: Robyn Ashley, Robyn Ashley Date: 02/19/2019 Gender: Female D.O.B: September 17, 1950 Age (years): 54 Referring Provider: Geraldo Pitter NP Height (inches): 62 Interpreting Physician: Chesley Mires MD, ABSM Weight (lbs): 147 RPSGT: Peak, Robert BMI: 27 MRN: [id number] Neck Size: 13.00  CLINICAL INFORMATION Sleep Study Type: NPSG  Indication for sleep study: Snoring, sleep disruption, and daytime sleepiness.  Prior history of sleep apnea and narcolepsy.  Epworth Sleepiness Score: 10  SLEEP STUDY TECHNIQUE As per the AASM Manual for the Scoring of Sleep and Associated Events v2.3 (April 2016) with a hypopnea requiring 4% desaturations.  The channels recorded and monitored were frontal, central and occipital EEG, electrooculogram (EOG), submentalis EMG (chin), nasal and oral airflow, thoracic and abdominal wall motion, anterior tibialis EMG, snore microphone, electrocardiogram, and pulse oximetry.  MEDICATIONS Medications self-administered by patient taken the night of the study : N/A  SLEEP ARCHITECTURE The study was initiated at 9:54:08 PM and ended at 5:30:32 AM.  Sleep onset time was 5.0 minutes and the sleep efficiency was 93.5%%. The total sleep time was 426.9 minutes.  Stage REM latency was 316.5 minutes.  The patient spent 8.7%% of the night in stage N1 sleep, 69.2%% in stage N2 sleep, 13.5%% in stage N3 and 8.7% in REM.  Alpha intrusion was absent.  Supine sleep was 59.26%.  RESPIRATORY PARAMETERS The overall apnea/hypopnea index (AHI) was 18.8 per hour. There were 20 total apneas, including 1 obstructive, 19 central and 0 mixed apneas. There were 114 hypopneas and 15 RERAs.  The AHI during Stage REM sleep was 58.4 per hour.  AHI while supine was 23.0 per hour.  The mean oxygen saturation was 91.3%. The minimum SpO2 during sleep was 82.0%.  moderate snoring was noted during this study.  CARDIAC DATA The 2 lead EKG demonstrated sinus rhythm. The mean  heart rate was 63.9 beats per minute. Other EKG findings include: None.  LEG MOVEMENT DATA The total PLMS were 0 with a resulting PLMS index of 0.0. Associated arousal with leg movement index was 0.0 .  IMPRESSIONS - Moderate obstructive sleep apnea with an AHI of 18.8 and SpO2 low of 82%. - Significant REM effect.  REM AHI 58.4.  DIAGNOSIS - Obstructive Sleep Apnea (327.23 [G47.33 ICD-10])  RECOMMENDATIONS - Additional therapies include weight loss, CPAP, oral appliance, or surgical assessment.  [Electronically signed] 03/03/2019 10:52 AM  Chesley Mires MD, ABSM Diplomate, American Board of Sleep Medicine NPI: [id number]

## 2019-03-03 NOTE — Telephone Encounter (Signed)
Called and spoke with pt letting her know the results of the HST and stated to her that VS wants [place] to schedule an appt to have tx options discussed. Pt verbalized understanding and has been scheduled a f/u appt with VS 8/19. Nothing further needed.

## 2019-03-03 NOTE — Telephone Encounter (Signed)
PSG 02/19/19 >> AHI 18.8, SpO2 low 82%.  REM AHI 58.4.   Please inform her that her sleep study shows moderate obstructive sleep apnea.  Please arrange for ROV with me to discuss treatment options.

## 2019-03-18 ENCOUNTER — Ambulatory Visit (INDEPENDENT_AMBULATORY_CARE_PROVIDER_SITE_OTHER): Payer: Medicare Other | Admitting: Pulmonary Disease

## 2019-03-18 ENCOUNTER — Encounter: Payer: Self-pay | Admitting: Pulmonary Disease

## 2019-03-18 ENCOUNTER — Other Ambulatory Visit: Payer: Self-pay

## 2019-03-18 VITALS — BP 122/80 | HR 61 | Temp 97.3°F | Ht 61.5 in | Wt 143.0 lb

## 2019-03-18 DIAGNOSIS — Z7189 Other specified counseling: Secondary | ICD-10-CM

## 2019-03-18 DIAGNOSIS — J438 Other emphysema: Secondary | ICD-10-CM | POA: Diagnosis not present

## 2019-03-18 DIAGNOSIS — G4733 Obstructive sleep apnea (adult) (pediatric): Secondary | ICD-10-CM

## 2019-03-18 NOTE — Patient Instructions (Signed)
Will arrange for new auto CPAP set up  Will call with results of pulmonary function test  Follow up in 2 months

## 2019-03-18 NOTE — Addendum Note (Signed)
Addended by: Vivia Ewing on: 03/18/2019 10:25 AM   Modules accepted: Orders

## 2019-03-18 NOTE — Progress Notes (Signed)
[facility]  Chief Complaint  Patient presents with  . Follow-up    Here to discuss HST results and treatments.     Constitutional:  BP 122/80   Pulse 61   Temp (!) 97.3 F (36.3 C) (Temporal)   Ht 5' 1.5" (1.562 m)   Wt 143 lb (64.9 kg)   SpO2 100%   BMI 26.58 kg/m   Past Medical History:  MVP, HTN, DJD, GERD with HH, TMJ  Brief Summary:  Robyn Ashley is a 68 y.o. female former smoker with emphysema on CT imaging and obstructive sleep apnea.  She had sleep study on July 23.  Showed moderate OSA, but severe in REM.  She was restarted on ritalin due to increased daytime sleepiness.  Not having cough, wheeze, sputum, chest pain, or leg swelling.   Physical Exam:   Appearance - well kempt   ENMT - clear nasal mucosa, midline nasal  septum, no oral exudates, no LAN, trachea midline, click with jaw opening  Respiratory - normal chest wall, normal respiratory effort, no accessory muscle use, no wheeze/rales  CV - s1s2 regular rate and rhythm, no murmurs, no peripheral edema, radial pulses symmetric  GI - soft, non tender, no masses  Lymph - no adenopathy noted in neck and axillary areas  MSK - normal gait  Ext - no cyanosis, clubbing, or joint inflammation noted  Skin - no rashes, lesions, or ulcers  Neuro - normal strength, oriented x 3  Psych - normal mood and affect   Assessment/Plan:   History of tobacco abuse with subtle changes of emphysema on CT Chest. - will call her with results of PFT - defer inhaler therapy for now since she isn't having symptoms  Obstructive sleep apnea. - reviewed sleep study with her - discussed therapy options - she has hx of TMJ - will arrange for auto CPAP   Patient Instructions  Will arrange for new auto CPAP set up  Will call with results of pulmonary function test  Follow up in 2 months   Robyn Mires, MD [place] Pager: [phone]  03/18/2019, 10:04 AM  Flow Sheet     Pulmonary tests:    Chest imaging:  CT chest 01/29/19 - mild pneumomediastinum related to fundoplication, interstitial edema, subtle upper lobe emphysema, small b/l pleural effusions  Sleep tests:  PSG 01/04/98 - AHI 4, SpO2 low 86% MSLT 06/16/98 - mean sleep latency 2 minutes, 5/5 naps with sleep onset, 0 SOREM PSG 02/19/19 >> AHI 18.8, SpO2 low 82%.  REM AHI 58.4.  Medications:   Allergies as of 03/18/2019      Reactions   Tetracyclines & Related Other (See Comments)   Pt developed gastritis while taking tetracycline   Aspirin    bruises   Norvasc [amlodipine Besylate]    Made BP drop low   Nsaids    bruising   Dexilant [dexlansoprazole] Diarrhea      Medication List       Accurate as of March 18, 2019 10:04 AM. If you have any questions, ask your nurse or doctor.        acetaminophen 650 MG CR tablet Commonly known as: TYLENOL Take 1,300 mg by mouth every 8 (eight) hours as needed for pain.   atorvastatin 20 MG tablet Commonly known as: LIPITOR Take 20 mg by mouth at bedtime.   CALCIUM 600+D PO Take 1 tablet by mouth daily.   cholecalciferol 25 MCG (1000 UT) tablet Commonly  known as: VITAMIN D3 Take 1,000 Units by mouth daily.   Co Q10 100 MG Caps Take 100 mg by mouth daily.   dicyclomine 10 MG capsule Commonly known as: BENTYL TAKE 1 CAPSULE(10 MG) BY MOUTH FOUR TIMES DAILY AS NEEDED FOR SPASMS OR ABDOMINAL PAIN What changed: See the new instructions.   docusate sodium 100 MG capsule Commonly known as: COLACE Take 1 capsule (100 mg total) by mouth 2 (two) times daily.   enalapril-hydrochlorothiazide 10-25 MG tablet Commonly known as: VASERETIC Take 1 tablet by mouth daily.   FLUoxetine 10 MG capsule Commonly known as: PROZAC Take 10 mg by mouth daily.   levothyroxine 50 MCG tablet Commonly known as: SYNTHROID Take 50 mcg by mouth daily.   methylphenidate 20 MG tablet Commonly known as: RITALIN Take  20 mg by mouth 3 (three) times daily.   metoprolol succinate 50 MG 24 hr tablet Commonly known as: TOPROL-XL Take 100 mg by mouth at bedtime.   multivitamin tablet Take 1 tablet by mouth daily.   pantoprazole 40 MG tablet Commonly known as: PROTONIX TAKE 1 TABLET(40 MG) BY MOUTH DAILY What changed: See the new instructions.   phenylephrine 10 MG Tabs tablet Commonly known as: SUDAFED PE Take 10 mg by mouth every 6 (six) hours as needed (allergies).   valACYclovir 1000 MG tablet Commonly known as: VALTREX Take 1,000 mg by mouth 2 (two) times daily.       Past Surgical History:  She  has a past surgical history that includes RF ablation of cervical nerves; Abdominal hysterectomy; Rotator cuff repair (Right, 11-21-2006  dr supple @[facility] ); Colonoscopy (N/A, 06/24/2018); Esophagogastroduodenoscopy (N/A, 06/24/2018); maloney dilation (N/A, 06/24/2018); biopsy (06/24/2018); polypectomy (06/24/2018); and Eye surgery (Bilateral).  Family History:  Her family history includes Colon cancer in her maternal grandfather and paternal grandmother.  Social History:  She  reports that she quit smoking about 7 months ago. Her smoking use included cigarettes. She started smoking about 50 years ago. She smoked 1.50 packs per day. She has never used smokeless tobacco. She reports previous alcohol use. She reports that she does not use drugs.

## 2019-03-21 NOTE — Progress Notes (Signed)
Reviewed and agree with assessment/plan.   Javontay Vandam, MD [place] [facility] 07/25/2016, 12:24 PM Pager:  [phone]  

## 2019-04-14 ENCOUNTER — Other Ambulatory Visit: Payer: Self-pay

## 2019-04-14 ENCOUNTER — Ambulatory Visit (INDEPENDENT_AMBULATORY_CARE_PROVIDER_SITE_OTHER): Payer: Medicare Other | Admitting: Nurse Practitioner

## 2019-04-14 ENCOUNTER — Encounter: Payer: Self-pay | Admitting: Nurse Practitioner

## 2019-04-14 VITALS — BP 105/65 | HR 66 | Temp 96.9°F | Ht 61.5 in | Wt 141.4 lb

## 2019-04-14 DIAGNOSIS — K219 Gastro-esophageal reflux disease without esophagitis: Secondary | ICD-10-CM

## 2019-04-14 DIAGNOSIS — K449 Diaphragmatic hernia without obstruction or gangrene: Secondary | ICD-10-CM

## 2019-04-14 DIAGNOSIS — Z8719 Personal history of other diseases of the digestive system: Secondary | ICD-10-CM | POA: Diagnosis not present

## 2019-04-14 DIAGNOSIS — R1310 Dysphagia, unspecified: Secondary | ICD-10-CM

## 2019-04-14 NOTE — Assessment & Plan Note (Signed)
Her IBS diarrhea is improved.  It is not optimally controlled and she still has intermittent diarrhea.  Her insurance "flatten out would not cover Viberzi" and they refused to even consider it.  Bentyl was not very effective.  At this point she expressed a desire to resolve her upper GI issues before attempting to try other medications for diarrhea.  Recommend she follow-up in 4 months with our office and call us if she has any worsening or severe symptoms.

## 2019-04-14 NOTE — Assessment & Plan Note (Signed)
Some persistent dysphagia symptoms after hiatal hernia repair.  Query rapid coming loose versus repaired too tight versus other etiology.  She has a follow-up with the surgeon scheduled in October and will consider a swallowing study at that time.  The patient admits she does still eat too fast and has been trying to slow down but it is difficult.  I recommended she take ample time to chew and swallow.  She remains on a full liquid diet with soft foods such as mashed potatoes and macaroni cheese (if small pieces of macaroni only).  Follow-up with surgeon as recommended.  Continue Ensure supplements.  Follow-up in 4 months in our office.

## 2019-04-14 NOTE — Assessment & Plan Note (Signed)
Hiatal hernia status post laparoscopic repair.  Doing improved but not optimal.  Has a follow-up scheduled in October with the surgeon.  Follow-up in our office in 4 months.

## 2019-04-14 NOTE — Assessment & Plan Note (Signed)
The patient is still having significant GERD symptoms and dysphagia symptoms status post laparoscopic hiatal hernia repair.  She has come off of most of her GERD medication.  She is still requiring Protonix up to 4 times a week as needed for breakthrough symptoms.  Still with some dysphagia symptoms that they feel is likely because she is eating too fast.  She has a follow-up with her surgeon in October and they will consider swallowing study at that time.  They are managing her GERD medications and treatments for now.  Recommend she continue her current medications further recommendations, follow-up as scheduled and follow-up in our office in 4 months.  Call for any severe or worsening symptoms.

## 2019-04-14 NOTE — Progress Notes (Signed)
Referring Provider: Redmond School, MD Primary Care Physician:  Redmond School, MD Primary GI:  Dr. Gala Romney  Chief Complaint  Patient presents with  . Gastroesophageal Reflux    taking protonix 3-4 times per week, still having reflux issues. Scheduled to see Dr. Kae Heller in Oct. Paraesophageal hernia repair in July.  . Irritable Bowel Syndrome    no constipation, occas diarrhea    HPI:   Robyn Ashley is a 68 y.o. female who presents for follow-up on GERD and IBS.  The patient was last seen in our office 01/01/2019 for GERD and IBS.  Noted chronic history of GERD, IBS diarrhea type.  Has never had regular bowel movements.  Zegerid for GERD was ineffective and switched to Dexilant.  Colonoscopy up-to-date 2019 and next due in 2029.  EGD on file essentially normal with benign duodenal biopsies.  Dexilant made her diarrhea worse and switched to AcipHex which helped.  Solid food dysphagia and recommended barium pill esophagram which found a variable sized type I hiatal hernia as outlined below.  She was referred to a surgeon and had hiatal hernia repair planned for 01/28/2019.  Barium pill esophagram found variable sized type I hiatal hernia with intermittent dilation and noted gaseous reflux in the stomach or swallowing which cause nausea and near vomiting which she likened to her past symptomatic episodes.  Generally because of primary peristaltic waves although some proximal escape, no difficulty with barium pill.  Noted we could refer her to a surgeon if she would like.  At her last visit still with a lot of GERD despite Protonix, pasty stools "every now and then" will have looser stools at times.  When she does have loose stools it is associated with abdominal pain and typically resolves after a bowel movement.  Restarted Bentyl and taking it 2-3 times a day and too soon to know if it is helping.  Some days does not need much.  No other GI complaints.  Gallbladder remains in situ, no history of  pancreatitis and does not drink alcohol.  Recommended continue Protonix, follow through with laparoscopic hiatal hernia repair, trial of Viberzi 75 mg twice a day with food, progress report in 1 to 2 weeks, follow-up in 3 months.  He called our office about 2 weeks later indicating Viberzi was helping and a prescription was sent to her pharmacy.  However, it would cost $1500 a month.  Pharmacy recommended Alosetron HCL.  The patient indicated she did not feel Bentyl was helping.  A prior Auth was initiated through "cover my meds".   The patient underwent repair of paraesophageal hernia on 01/28/2019.  Today she states surgery did well. Her surgeon is wanting to do a swalloing test due to persistent dysphagia but the patient asked for more time which was given. She is using the 20-20-20 eating plan (SLOW down eating) due to patient still feeling like she's eating too fast. On a full liquid/soft diet (cream soups, mashed potatoes, small mac-n-cheese pieces. Drinking ensure. Has lost small amount of weight due to diet changes after surgery. Has follow-up with surgeon 10/15. GERD symptoms are still ongoing, not as much luck coming off Protonix as they thought she would; currently taking Protonix 3-4 times a week due to flares. If she eats something that won't go down and she has to regurgitate (happens a couple times a week.) Diarrhea is improved, but still persistent. Not eating as much solid food and thought it would be worse, but not the case.  Denies N/V, hematochezia, melena, fever, chills. Denies URI or flu-like symptoms. Denies loss of sense of taste or smell. Denies chest pain, dyspnea, dizziness, lightheadedness, syncope, near syncope. Denies any other upper or lower GI symptoms.  She wants to get through her UGI issues before addressing residual diarrhea. Denies abdominal pain.  Past Medical History:  Diagnosis Date  . GERD (gastroesophageal reflux disease)   . Hiatal hernia   . History of  degenerative disc disease   . History of nuclear stress test 07/27/2013   in care everywhere per cardiology note by dr Donata Clay:  test normal  . Hypertension   . Intermittent palpitations   . MVP (mitral valve prolapse)    per pt dx 1980s,  last echo done 2016 by dr Donata Clay [facility] cardiology in [place]) and last cardiology visit 11-02-2014 in epic;   01-27-2019 per pt episodic palpitations and takes toprol, currently followed by pcp  . Narcolepsy   . OSA (obstructive sleep apnea)    Not on CPAP  . TMJ arthralgia     Past Surgical History:  Procedure Laterality Date  . ABDOMINAL HYSTERECTOMY    . BIOPSY  06/24/2018   Procedure: BIOPSY;  Surgeon: Daneil Dolin, MD;  [facility];  Service: Endoscopy;;  (gastric) (colon)  . COLONOSCOPY N/A 06/24/2018   Procedure: COLONOSCOPY;  Surgeon: Daneil Dolin, MD;  Location: [facility];  Service: Endoscopy;  Laterality: N/A;  1:45pm  . ESOPHAGOGASTRODUODENOSCOPY N/A 06/24/2018   Procedure: ESOPHAGOGASTRODUODENOSCOPY (EGD);  Surgeon: Daneil Dolin, MD;  [facility]: [facility];  Service: Endoscopy;  Laterality: N/A;  . EYE SURGERY Bilateral    rk  . MALONEY DILATION N/A 06/24/2018   Procedure: Venia Minks DILATION;  Surgeon: Daneil Dolin, MD;  [facility]: [facility];  Service: Endoscopy;  Laterality: N/A;  . POLYPECTOMY  06/24/2018   Procedure: POLYPECTOMY;  Surgeon: Daneil Dolin, MD;  [facility]: [facility];  Service: Endoscopy;;  colon   . RF ablation of cervical nerves    . ROTATOR CUFF REPAIR Right 11-21-2006  dr supple _0     Current Outpatient Medications  Medication Sig Dispense Refill  . acetaminophen (TYLENOL) 650 MG CR tablet Take 1,300 mg by mouth every 8 (eight) hours as needed for pain.     Marland Kitchen atorvastatin (LIPITOR) 20 MG tablet Take 20 mg by mouth at bedtime.     . Calcium Carbonate-Vitamin D (CALCIUM 600+D PO) Take 1 tablet by mouth daily.    . cholecalciferol (VITAMIN D3) 25 MCG (1000 UT) tablet Take 1,000  Units by mouth daily.    . Coenzyme Q10 (CO Q10) 100 MG CAPS Take 100 mg by mouth daily.     . enalapril-hydrochlorothiazide (VASERETIC) 10-25 MG tablet Take 1 tablet by mouth daily.  1  . FLUoxetine (PROZAC) 10 MG capsule Take 10 mg by mouth daily.   0  . levothyroxine (SYNTHROID, LEVOTHROID) 50 MCG tablet Take 50 mcg by mouth daily.   2  . metoprolol succinate (TOPROL-XL) 50 MG 24 hr tablet Take 100 mg by mouth at bedtime.   10  . Multiple Vitamin (MULTIVITAMIN) tablet Take 1 tablet by mouth daily.    . NON FORMULARY CPAP at night    . pantoprazole (PROTONIX) 40 MG tablet TAKE 1 TABLET(40 MG) BY MOUTH DAILY (Patient taking differently: Take 40 mg by mouth. 3-4 times per week) 30 tablet 11  . phenylephrine (SUDAFED PE) 10 MG TABS tablet Take 10 mg by mouth every 6 (six)  hours as needed (allergies).     . valACYclovir (VALTREX) 1000 MG tablet Take 1,000 mg by mouth 2 (two) times daily as needed.      No current facility-administered medications for this visit.     Allergies as of 04/14/2019 - Review Complete 04/14/2019  Allergen Reaction Noted  . Tetracyclines & related Other (See Comments) 01/10/2011  . Aspirin  05/07/2018  . Norvasc [amlodipine besylate]  05/07/2018  . Nsaids  05/07/2018  . Dexilant [dexlansoprazole] Diarrhea 08/20/2018    Family History  Problem Relation Age of Onset  . Colon cancer Maternal Grandfather   . Colon cancer Paternal Grandmother     Social History   Socioeconomic History  . Marital status: Married    Spouse name: Not on file  . Number of children: Not on file  . Years of education: Not on file  . Highest education level: Not on file  Occupational History  . Not on file  Social Needs  . Financial resource strain: Not on file  . Food insecurity    Worry: Not on file    Inability: Not on file  . Transportation needs    Medical: Not on file    Non-medical: Not on file  Tobacco Use  . Smoking status: Former Smoker    Packs/day: 1.50     Types: Cigarettes    Start date: 1970    Quit date: 2020    Years since quitting: 0.7  . Smokeless tobacco: Never Used  Substance and Sexual Activity  . Alcohol use: Not Currently    Frequency: Never    Comment: none in many years  . Drug use: Never  . Sexual activity: Not on file  Lifestyle  . Physical activity    Days per week: Not on file    Minutes per session: Not on file  . Stress: Not on file  Relationships  . Social Herbalist on phone: Not on file    Gets together: Not on file    Attends religious service: Not on file    Active member of club or organization: Not on file    Attends meetings of clubs or organizations: Not on file    Relationship status: Not on file  Other Topics Concern  . Not on file  Social History Narrative  . Not on file    Review of Systems: General: Negative for anorexia, weight loss, fever, chills, fatigue, weakness. ENT: Negative for hoarseness, difficulty swallowing. CV: Negative for chest pain, angina, palpitations, peripheral edema.  Respiratory: Negative for dyspnea at rest, cough, sputum, wheezing.  GI: See history of present illness. Endo: Negative for unusual weight change.  Heme: Negative for bruising or bleeding. Allergy: Negative for rash or hives.   Physical Exam: BP 105/65   Pulse 66   Temp (!) 96.9 F (36.1 C) (Oral)   Ht 5' 1.5" (1.562 m)   Wt 141 lb 6.4 oz (64.1 kg)   BMI 26.28 kg/m  General:   Alert and oriented. Pleasant and cooperative. Well-nourished and well-developed.  Eyes:  Without icterus, sclera clear and conjunctiva pink.  Ears:  Normal auditory acuity. Cardiovascular:  S1, S2 present without murmurs appreciated. Extremities without clubbing or edema. Respiratory:  Clear to auscultation bilaterally. No wheezes, rales, or rhonchi. No distress.  Gastrointestinal:  +BS, soft, non-tender and non-distended. No HSM noted. No guarding or rebound. No masses appreciated.  Rectal:  Deferred   Musculoskalatal:  Symmetrical without gross deformities. Neurologic:  Alert and  oriented x4;  grossly normal neurologically. Psych:  Alert and cooperative. Normal mood and affect. Heme/Lymph/Immune: No excessive bruising noted.    04/14/2019 11:27 AM   Disclaimer: This note was dictated with voice recognition software. Similar sounding words can inadvertently be transcribed and may not be corrected upon review.

## 2019-04-14 NOTE — Patient Instructions (Signed)
Your health issues we discussed today were:   GERD (reflux/heartburn) with dysphagia (difficulty swallowing): 1. Continue your current medications 2. Follow-up with the surgeon in October as scheduled 3. Call us if you have any worsening or severe symptoms  Irritable bowel syndrome -diarrhea type: 1. I am sorry insurance would not cover Viberzi that seem to work well for you 2. Based on discussion, and per your wishes, we can further address your persistent diarrhea (although improved) after your stomach issues have been resolved 3. Call us if you have any worsening or severe symptoms  Overall I recommend:  1. Continue your other current medications 2. Follow-up in 4 months 3. Call us if you have any questions or concerns.   Because of recent events of COVID-19 ("Coronavirus"), follow CDC recommendations:  1. Wash your hand frequently 2. Avoid touching your face 3. Stay away from people who are sick 4. If you have symptoms such as fever, cough, shortness of breath then call your healthcare provider for further guidance 5. If you are sick, STAY AT HOME unless otherwise directed by your healthcare provider. 6. Follow directions from state and national officials regarding staying safe   At [facility] we value your feedback. You may receive a survey about your visit today. Please share your experience as we strive to create trusting relationships with our patients to provide genuine, compassionate, quality care.  We appreciate your understanding and patience as we review any laboratory studies, imaging, and other diagnostic tests that are ordered as we care for you. Our office policy is 5 business days for review of these results, and any emergent or urgent results are addressed in a timely manner for your best interest. If you do not hear from our office in 1 week, please contact us.   We also encourage the use of MyChart, which contains your medical information for your  review as well. If you are not enrolled in this feature, an access code is on this after visit summary for your convenience. Thank you for allowing [place] to be involved in your care.  It was great to see you today!  I hope you have a great Fall!!

## 2019-04-19 NOTE — Progress Notes (Signed)
CC'ED TO PCP 

## 2019-05-21 ENCOUNTER — Other Ambulatory Visit (HOSPITAL_COMMUNITY)
Admission: RE | Admit: 2019-05-21 | Discharge: 2019-05-21 | Disposition: A | Discharge status: Home / Self Care | Payer: Medicare Other | Source: Ambulatory Visit | Attending: Pulmonary Disease | Admitting: Pulmonary Disease

## 2019-05-21 ENCOUNTER — Other Ambulatory Visit: Payer: Self-pay

## 2019-05-21 DIAGNOSIS — Z01812 Encounter for preprocedural laboratory examination: Secondary | ICD-10-CM | POA: Insufficient documentation

## 2019-05-21 DIAGNOSIS — Z20828 Contact with and (suspected) exposure to other viral communicable diseases: Secondary | ICD-10-CM | POA: Insufficient documentation

## 2019-05-21 LAB — SARS CORONAVIRUS 2 (TAT 6-24 HRS): SARS Coronavirus 2: NEGATIVE

## 2019-05-26 ENCOUNTER — Ambulatory Visit (INDEPENDENT_AMBULATORY_CARE_PROVIDER_SITE_OTHER): Payer: Medicare Other | Admitting: Pulmonary Disease

## 2019-05-26 ENCOUNTER — Encounter: Payer: Self-pay | Admitting: Pulmonary Disease

## 2019-05-26 ENCOUNTER — Other Ambulatory Visit: Payer: Self-pay

## 2019-05-26 VITALS — BP 118/64 | HR 60 | Temp 97.5°F | Ht 62.0 in | Wt 139.0 lb

## 2019-05-26 DIAGNOSIS — G4733 Obstructive sleep apnea (adult) (pediatric): Secondary | ICD-10-CM

## 2019-05-26 DIAGNOSIS — Z87891 Personal history of nicotine dependence: Secondary | ICD-10-CM | POA: Diagnosis not present

## 2019-05-26 DIAGNOSIS — J438 Other emphysema: Secondary | ICD-10-CM

## 2019-05-26 LAB — PULMONARY FUNCTION TEST
DL/VA % pred: 86 %
DL/VA: 3.67 ml/min/mmHg/L
DLCO unc % pred: 82 %
DLCO unc: 15.16 ml/min/mmHg
FEF 25-75 Post: 1.76 L/sec
FEF 25-75 Pre: 0.97 L/sec
FEF2575-%Change-Post: 81 %
FEF2575-%Pred-Post: 89 %
FEF2575-%Pred-Pre: 49 %
FEV1-%Change-Post: 17 %
FEV1-%Pred-Post: 79 %
FEV1-%Pred-Pre: 67 %
FEV1-Post: 1.74 L
FEV1-Pre: 1.48 L
FEV1FVC-%Change-Post: 13 %
FEV1FVC-%Pred-Pre: 92 %
FEV6-%Change-Post: 4 %
FEV6-%Pred-Post: 78 %
FEV6-%Pred-Pre: 74 %
FEV6-Post: 2.17 L
FEV6-Pre: 2.07 L
FEV6FVC-%Pred-Post: 104 %
FEV6FVC-%Pred-Pre: 104 %
FVC-%Change-Post: 3 %
FVC-%Pred-Post: 75 %
FVC-%Pred-Pre: 72 %
FVC-Post: 2.17 L
FVC-Pre: 2.09 L
Post FEV1/FVC ratio: 80 %
Post FEV6/FVC ratio: 100 %
Pre FEV1/FVC ratio: 71 %
Pre FEV6/FVC Ratio: 100 %
RV % pred: 109 %
RV: 2.2 L
TLC % pred: 99 %
TLC: 4.72 L

## 2019-05-26 NOTE — Progress Notes (Signed)
[facility]  Chief Complaint  Patient presents with  . Results    Discuss PFT results    Constitutional:  BP 118/64 (BP Location: Right Arm, Patient Position: Sitting, Cuff Size: Normal)   Pulse 60   Temp (!) 97.5 F (36.4 C)   Ht 5\' 2"  (1.575 m)   Wt 139 lb (63 kg)   SpO2 97% Comment: on room air  BMI 25.42 kg/m   Past Medical History:  MVP, HTN, DJD, GERD with HH, TMJ  Brief Summary:  Robyn Ashley is a 68 y.o. female former smoker with emphysema on CT imaging and obstructive sleep apnea.  She has been doing well with CPAP.  Sleeping better and feels more alert.  Using hybrid mask.  Not having sinus congestion, sore throat, or aerophagia.  Mild dry mouth in the morning.    Had PFT today.  Essentially normal except for positive bronchodilator response.  She is not having cough, wheeze, sputum, or chest congestion.  Denies getting short of breath with activity.  She plans to get her flu shot later this month.   Physical Exam:   Appearance - well kempt   ENMT - no sinus tenderness, no nasal discharge, no oral exudate  Neck - no masses, trachea midline, no thyromegaly, no elevation in JVP  Respiratory - normal appearance of chest wall, normal respiratory effort w/o accessory muscle use, no dullness on percussion, no wheezing or rales  CV - s1s2 regular rate and rhythm, no murmurs, no peripheral edema, radial pulses symmetric  GI - soft, non tender  Lymph - no adenopathy noted in neck and axillary areas  MSK - normal gait  Ext - no cyanosis, clubbing, or joint inflammation noted  Skin - no rashes, lesions, or ulcers  Neuro - normal strength, oriented x 3  Psych - normal mood and affect    Assessment/Plan:   History of tobacco abuse with subtle changes of emphysema on CT Chest. - PFT showed bronchodilator responsiveness, but no respiratory symptoms to suggest asthma - monitor clinically - defer inhaler therapy at  this time  Obstructive sleep apnea. - she is compliant with CPAP and reports benefit from therapy - continue auto CPAP - she will f/u with DME to get mask refit - discussed techniques to help alleviate mouth dryness   Patient Instructions  Follow up in 1 year   Chesley Mires, MD [place] Pager: [phone] 05/26/2019, 10:33 AM  Flow Sheet     Pulmonary tests:  PFT 05/26/19 >> FEV1 1.74 (79%), FEV1% 80, TLC 4.27 (99%), DLCO 82%, +BD  Chest imaging:  CT chest 01/29/19 >> mild pneumomediastinum related to fundoplication, interstitial edema, subtle upper lobe emphysema, small b/l pleural effusions  Sleep tests:  PSG 01/04/98 - AHI 4, SpO2 low 86% MSLT 06/16/98 - mean sleep latency 2 minutes, 5/5 naps with sleep onset, 0 SOREM PSG 02/19/19 >> AHI 18.8, SpO2 low 82%.  REM AHI 58.4. Auto CPAP 04/26/19 to 05/25/19 >> used on 30 of 30 nights with average 8 hrs 2 min.  Average AHI 5.6 with median CPAP 8 and 95 th percentile CPAP 12 cm H2O.  Medications:   Allergies as of 05/26/2019      Reactions   Tetracyclines & Related Other (See Comments)   Pt developed gastritis while taking tetracycline   Aspirin    bruises   Norvasc [amlodipine Besylate]    Made BP drop low   Nsaids    bruising  Dexilant [dexlansoprazole] Diarrhea      Medication List       Accurate as of May 26, 2019 10:33 AM. If you have any questions, ask your nurse or doctor.        STOP taking these medications   pantoprazole 40 MG tablet Commonly known as: PROTONIX Stopped by: Chesley Mires, MD     TAKE these medications   acetaminophen 650 MG CR tablet Commonly known as: TYLENOL Take 1,300 mg by mouth every 8 (eight) hours as needed for pain.   atorvastatin 20 MG tablet Commonly known as: LIPITOR Take 20 mg by mouth at bedtime.   CALCIUM 600+D PO Take 1 tablet by mouth daily.   cholecalciferol 25 MCG (1000 UT) tablet Commonly known as: VITAMIN D3 Take 1,000 Units by  mouth daily.   Co Q10 100 MG Caps Take 100 mg by mouth daily.   enalapril-hydrochlorothiazide 10-25 MG tablet Commonly known as: VASERETIC Take 1 tablet by mouth daily.   FLUoxetine 10 MG capsule Commonly known as: PROZAC Take 10 mg by mouth daily.   levothyroxine 50 MCG tablet Commonly known as: SYNTHROID Take 50 mcg by mouth daily.   metoprolol succinate 50 MG 24 hr tablet Commonly known as: TOPROL-XL Take 50 mg by mouth at bedtime.   multivitamin tablet Take 1 tablet by mouth daily.   NON FORMULARY CPAP at night   phenylephrine 10 MG Tabs tablet Commonly known as: SUDAFED PE Take 10 mg by mouth every 6 (six) hours as needed (allergies).   valACYclovir 1000 MG tablet Commonly known as: VALTREX Take 1,000 mg by mouth 2 (two) times daily as needed.       Past Surgical History:  She  has a past surgical history that includes RF ablation of cervical nerves; Abdominal hysterectomy; Rotator cuff repair (Right, 11-21-2006  dr supple @[facility] ); Colonoscopy (N/A, 06/24/2018); Esophagogastroduodenoscopy (N/A, 06/24/2018); maloney dilation (N/A, 06/24/2018); biopsy (06/24/2018); polypectomy (06/24/2018); and Eye surgery (Bilateral).  Family History:  Her family history includes Colon cancer in her maternal grandfather and paternal grandmother.  Social History:  She  reports that she quit smoking about 9 months ago. Her smoking use included cigarettes. She started smoking about 50 years ago. She smoked 1.50 packs per day. She has never used smokeless tobacco. She reports previous alcohol use. She reports that she does not use drugs.

## 2019-05-26 NOTE — Progress Notes (Signed)
Full PFT performed today. °

## 2019-05-26 NOTE — Patient Instructions (Signed)
Follow up in 1 year.

## 2019-06-11 ENCOUNTER — Other Ambulatory Visit: Payer: Self-pay | Admitting: Surgery

## 2019-06-11 DIAGNOSIS — R1310 Dysphagia, unspecified: Secondary | ICD-10-CM

## 2019-06-12 ENCOUNTER — Other Ambulatory Visit: Payer: Self-pay

## 2019-06-12 ENCOUNTER — Telehealth: Payer: Self-pay | Admitting: Gastroenterology

## 2019-06-12 ENCOUNTER — Ambulatory Visit (HOSPITAL_COMMUNITY)
Admission: RE | Admit: 2019-06-12 | Discharge: 2019-06-12 | Disposition: A | Discharge status: Home / Self Care | Payer: Medicare Other | Source: Ambulatory Visit | Attending: Surgery | Admitting: Surgery

## 2019-06-12 DIAGNOSIS — R1310 Dysphagia, unspecified: Secondary | ICD-10-CM | POA: Diagnosis not present

## 2019-06-12 DIAGNOSIS — K228 Other specified diseases of esophagus: Secondary | ICD-10-CM | POA: Diagnosis not present

## 2019-06-12 NOTE — Telephone Encounter (Signed)
DOD June 12, 2019  Dr. Loletha Carrow, there is a STAT referral from Dr. Kae Heller at [facility] for endoscopic dilation, possible Kenalog injection of tight nissen wrap.  Mrs. Orloski is established with Dr. Gala Romney at [facility] (in [place] for review.)  She stated that [facility] "does a procedure that they don't do at [facility]."    Records from [facility] will be sent to you for review.  Please advise scheduling.

## 2019-06-12 NOTE — Telephone Encounter (Signed)
[facility] staff,  I recommend this patient be seen ASAP by Dr. Gala Romney.  Please convey that to the patient and also to the referring physician.  I will copy this to Dr. Kae Heller at [facility] surgery for her review - that practice is not on this EMR in their office, but they are able to access it if they are notified to check.  I will also copy this to Dr. Gala Romney.  ________________________________________________________________  Dr. Kae Heller,  I received you note and have seen the upper GI series report from today, and the referral request for endoscopic dilation and possible Kenalog injection.  I am not aware there is any role for Kenalog injection in this scenario.  This appears to be E GJ outflow obstruction from a tight fundoplication wrap.  If there is any role for endoscopic evaluation and possible dilation, that is within scope of practice of her primary GI group, who is quite familiar with her. I recommend she be seen by Dr. Lavone Neri ASAP, and have copied him on this as well.    Dr Gala Romney,  Please see above, and have your clinical staff contact this patient and obtain Dr. Ron Parker notes for your review.    Wilfrid Lund, MD    [facility]

## 2019-06-12 NOTE — Telephone Encounter (Signed)
Spoke to patient and conveyed message for patient to call Dr. Roseanne Kaufman office to schedule an urgent appointment.

## 2019-06-14 NOTE — Telephone Encounter (Signed)
Thanks; will get her back to the office.

## 2019-06-16 ENCOUNTER — Telehealth: Payer: Self-pay | Admitting: *Deleted

## 2019-06-16 ENCOUNTER — Other Ambulatory Visit: Payer: Self-pay | Admitting: *Deleted

## 2019-06-16 ENCOUNTER — Ambulatory Visit (INDEPENDENT_AMBULATORY_CARE_PROVIDER_SITE_OTHER): Payer: Medicare Other | Admitting: Nurse Practitioner

## 2019-06-16 ENCOUNTER — Encounter: Payer: Self-pay | Admitting: Nurse Practitioner

## 2019-06-16 ENCOUNTER — Encounter: Payer: Self-pay | Admitting: *Deleted

## 2019-06-16 ENCOUNTER — Other Ambulatory Visit: Payer: Self-pay

## 2019-06-16 VITALS — BP 106/63 | HR 64 | Temp 96.2°F | Ht 61.5 in | Wt 133.8 lb

## 2019-06-16 DIAGNOSIS — Z8719 Personal history of other diseases of the digestive system: Secondary | ICD-10-CM

## 2019-06-16 DIAGNOSIS — R1310 Dysphagia, unspecified: Secondary | ICD-10-CM

## 2019-06-16 DIAGNOSIS — K219 Gastro-esophageal reflux disease without esophagitis: Secondary | ICD-10-CM

## 2019-06-16 NOTE — Progress Notes (Signed)
Referring Provider: Redmond School, MD Primary Care Physician:  Redmond School, MD Primary GI:  Dr. Gala Romney  Chief Complaint  Patient presents with  . Dysphagia    after hernia repair; feels like everything stops in chest    HPI:   Robyn Ashley is a 68 y.o. female who presents for follow-up regarding diarrhea.  The patient was last seen in our office 04/14/2019 for GERD, dysphagia, history of IBS, hiatal hernia.  Noted chronic history of GERD, IBS diarrhea type.  Colonoscopy up-to-date 2019 and next due in 2029.  Previous attempts at Zegerid was ineffective.  Switch to Dexilant caused worsening diarrhea.  She was then switched to AcipHex which seemed to initially help.  Noted hiatal hernia and referred to surgeon with complaint surgery in July 2020.  She was at one point trialed on Viberzi which helped however it would cost $1500 a month and the pharmacy recommended Alosetron HCl.  Bentyl not effective.  A prior Auth was initiated for Viberzi through "cover my meds."  At her last visit she noted hiatal hernia surgery went well, pending swallowing test due to persistent dysphagia.  Feels she eats too fast and has been on a reduced speed diet.  Also on full liquid/soft diet with Ensure supplementation.  Some weight loss after surgery.  Plan follow-up with surgery on October 15.  GERD ongoing despite Protonix, currently taking Protonix 3-4 times a week due to flares.  Diarrhea improved but still persistent.  No other overt GI complaints.  Recommended continue current medications, follow-up with surgery based on their recommendations, hold off on diarrhea evaluation until stomach issues resolved, follow-up in 4 months.  Reviewed notes dated 06/12/2019 which indicated tight Nissen wrap and recommended esophageal dilation and possible Kenalog injection.  Upon further review it was found that Kenalog does not really have an application here and EGD with dilation is within the scope of primary GI.   Recommended follow-up with our office for further discussion.  Today she states she's doing ok overall. Most foods getting stuck (dysphagia) with regurgitation. Sometimes cream soups cause an issue. Is eating with a "baby spoon." Frequent regurgitation. Currently drinking cream soups, Ensure; occasionally tries bean soup but hasn't had a good experience yet. Is having some weight loss related to limited intake. Objectively she has lost 8 lbs in 2 months. Stools still loose to paste (Bristol 5-6 stools). Denies abdominal pain, N/V, hematochezia, melena, fever, chills. Denies URI or flu-like symptoms. Denies loss of sense of taste or smell. Denies chest pain, dyspnea, dizziness, lightheadedness, syncope, near syncope. Denies any other upper or lower GI symptoms.  GERD doing well off Pantoprazole.  Past Medical History:  Diagnosis Date  . GERD (gastroesophageal reflux disease)   . Hiatal hernia   . History of degenerative disc disease   . History of nuclear stress test 07/27/2013   in care everywhere per cardiology note by dr Donata Clay:  test normal  . Hypertension   . Intermittent palpitations   . MVP (mitral valve prolapse)    per pt dx 1980s,  last echo done 2016 by dr Donata Clay [facility] cardiology in [place]) and last cardiology visit 11-02-2014 in epic;   01-27-2019 per pt episodic palpitations and takes toprol, currently followed by pcp  . Narcolepsy   . OSA (obstructive sleep apnea)    Not on CPAP  . TMJ arthralgia     Past Surgical History:  Procedure Laterality Date  . ABDOMINAL HYSTERECTOMY    . BIOPSY  06/24/2018   Procedure: BIOPSY;  Surgeon: Daneil Dolin, MD;  [facility];  Service: Endoscopy;;  (gastric) (colon)  . COLONOSCOPY N/A 06/24/2018   Procedure: COLONOSCOPY;  Surgeon: Daneil Dolin, MD;  [facility];  Service: Endoscopy;  Laterality: N/A;  1:45pm  . ESOPHAGOGASTRODUODENOSCOPY N/A 06/24/2018   Procedure: ESOPHAGOGASTRODUODENOSCOPY (EGD);  Surgeon:  Daneil Dolin, MD;  [facility];  Service: Endoscopy;  Laterality: N/A;  . EYE SURGERY Bilateral    rk  . MALONEY DILATION N/A 06/24/2018   Procedure: Venia Minks DILATION;  Surgeon: Daneil Dolin, MD;  [facility];  Service: Endoscopy;  Laterality: N/A;  . POLYPECTOMY  06/24/2018   Procedure: POLYPECTOMY;  Surgeon: Daneil Dolin, MD;  [facility];  Service: Endoscopy;;  colon   . RF ablation of cervical nerves    . ROTATOR CUFF REPAIR Right 11-21-2006  dr supple @[facility]     Current Outpatient Medications  Medication Sig Dispense Refill  . acetaminophen (TYLENOL) 650 MG CR tablet Take 1,300 mg by mouth every 8 (eight) hours as needed for pain.     Marland Kitchen atorvastatin (LIPITOR) 20 MG tablet Take 20 mg by mouth at bedtime.     . Calcium Carbonate-Vitamin D (CALCIUM 600+D PO) Take 1 tablet by mouth daily.    . cholecalciferol (VITAMIN D3) 25 MCG (1000 UT) tablet Take 1,000 Units by mouth daily.    . enalapril-hydrochlorothiazide (VASERETIC) 10-25 MG tablet Take 1 tablet by mouth daily.  1  . FLUoxetine (PROZAC) 10 MG capsule Take 10 mg by mouth daily.   0  . levothyroxine (SYNTHROID, LEVOTHROID) 50 MCG tablet Take 50 mcg by mouth daily.   2  . metoprolol succinate (TOPROL-XL) 50 MG 24 hr tablet Take 50 mg by mouth at bedtime.   10  . Multiple Vitamin (MULTIVITAMIN) tablet Take 1 tablet by mouth daily.    . NON FORMULARY CPAP at night    . phenylephrine (SUDAFED PE) 10 MG TABS tablet Take 10 mg by mouth every 6 (six) hours as needed (allergies).     . valACYclovir (VALTREX) 1000 MG tablet Take 1,000 mg by mouth 2 (two) times daily as needed.      No current facility-administered medications for this visit.     Allergies as of 06/16/2019 - Review Complete 06/16/2019  Allergen Reaction Noted  . Tetracyclines & related Other (See Comments) 01/10/2011  . Aspirin  05/07/2018  . Norvasc [amlodipine besylate]  05/07/2018  . Nsaids  05/07/2018  . Dexilant  [dexlansoprazole] Diarrhea 08/20/2018    Family History  Problem Relation Age of Onset  . Colon cancer Maternal Grandfather   . Colon cancer Paternal Grandmother     Social History   Socioeconomic History  . Marital status: Married    Spouse name: Not on file  . Number of children: Not on file  . Years of education: Not on file  . Highest education level: Not on file  Occupational History  . Not on file  Social Needs  . Financial resource strain: Not on file  . Food insecurity    Worry: Not on file    Inability: Not on file  . Transportation needs    Medical: Not on file    Non-medical: Not on file  Tobacco Use  . Smoking status: Former Smoker    Packs/day: 1.50    Types: Cigarettes    Start date: 1970    Quit date: 2020  Years since quitting: 0.8  . Smokeless tobacco: Never Used  Substance and Sexual Activity  . Alcohol use: Not Currently    Frequency: Never    Comment: none in many years  . Drug use: Never  . Sexual activity: Not on file  Lifestyle  . Physical activity    Days per week: Not on file    Minutes per session: Not on file  . Stress: Not on file  Relationships  . Social Herbalist on phone: Not on file    Gets together: Not on file    Attends religious service: Not on file    Active member of club or organization: Not on file    Attends meetings of clubs or organizations: Not on file    Relationship status: Not on file  Other Topics Concern  . Not on file  Social History Narrative  . Not on file    Review of Systems: General: Negative for anorexia, weight loss, fever, chills, fatigue, weakness. ENT: Negative for hoarseness. Admits dysphagia as per HPI. CV: Negative for chest pain, angina, palpitations, peripheral edema.  Respiratory: Negative for dyspnea at rest, cough, sputum, wheezing.  GI: See history of present illness. Endo: Negative for unusual weight change.  Heme: Negative for bruising or bleeding. Allergy: Negative  for rash or hives.   Physical Exam: BP 106/63   Pulse 64   Temp (!) 96.2 F (35.7 C) (Temporal)   Ht 5' 1.5" (1.562 m)   Wt 133 lb 12.8 oz (60.7 kg)   BMI 24.87 kg/m  General:   Alert and oriented. Pleasant and cooperative. Well-nourished and well-developed.  Eyes:  Without icterus, sclera clear and conjunctiva pink.  Ears:  Normal auditory acuity. Cardiovascular:  S1, S2 present without murmurs appreciated. Extremities without clubbing or edema. Respiratory:  Clear to auscultation bilaterally. No wheezes, rales, or rhonchi. No distress.  Gastrointestinal:  +BS, soft, non-tender and non-distended. No HSM noted. No guarding or rebound. No masses appreciated.  Rectal:  Deferred  Musculoskalatal:  Symmetrical without gross deformities. Neurologic:  Alert and oriented x4;  grossly normal neurologically. Psych:  Alert and cooperative. Normal mood and affect. Heme/Lymph/Immune: No excessive bruising noted.    06/16/2019 1:48 PM   Disclaimer: This note was dictated with voice recognition software. Similar sounding words can inadvertently be transcribed and may not be corrected upon review.

## 2019-06-16 NOTE — Assessment & Plan Note (Signed)
GERD currently asymptomatic.  She has been able to come off of her PPI.  Recommend she continue to monitor, restart PPI as needed.  Follow-up at already scheduled follow-up appointment.

## 2019-06-16 NOTE — Assessment & Plan Note (Signed)
Persistent IBS-D type symptoms mostly with pasty/soft to loose stools.  She is okay dealing with this for now until we can sort out her upper GI issues.  Recommend she continue her current medications and follow-up in 2 months at her already scheduled appointment.

## 2019-06-16 NOTE — Telephone Encounter (Signed)
Called pt. She is now scheduled with RMR for procedure 12/15 at 3:00pm. Patient aware will send instructions and COVID appt via mychart.

## 2019-06-16 NOTE — Assessment & Plan Note (Signed)
Her main complaint today is significant and worsening dysphagia symptoms.  She recently had a Nissen fundoplication.  Barium pill esophagram a couple days ago showed Nissen likely wrapped too tight.  Recommended EGD with dilation.  Initially there was a thought about Kenalog Injection but lower GI, who would be able to perform this, indicated no need for Kenalog & dilation should suffice for now.  We will get her on the schedule soon as we can.  She should follow-up with surgery based on their recommendations.  I had a frank discussion with her about use of Ensure and other protein supplements such as protein drinks, protein augmented chocolate milk drinks, full liquid diet.  Recommended that she eat even full liquids slowly to help prevent "backup" and regurgitation.  She verbalized understanding.

## 2019-06-16 NOTE — Patient Instructions (Signed)
Your health issues we discussed today were:   GERD (reflux/heartburn): 1. I am glad your GERD symptoms have gotten better and you no longer need to take Protonix 2. Continue to monitor your symptoms and let us know if they become worse again  Difficulty swallowing: 1. We will schedule you for an upper endoscopy with dilation as soon as possible 2. We will likely also place her on a cancellation list in case an earlier opening comes available 3. As we discussed, try to stick to full liquids such as cream soups, Ensure, or other protein drinks such as muscle milk or Premier protein.  [employer] makes a protein supplemented chocolate milk as well. 4. Continue your other current medications 5. If substances become stuck and will not go forward or backward despite several hours, you can proceed to the emergency department for evaluation  Overall I recommend:  1. Continue your other current medications 2. Follow-up at your appointment already scheduled 3. Call us if you have any questions or concerns.   Because of recent events of COVID-19 ("Coronavirus"), follow CDC recommendations:  1. Wash your hand frequently 2. Avoid touching your face 3. Stay away from people who are sick 4. If you have symptoms such as fever, cough, shortness of breath then call your healthcare provider for further guidance 5. If you are sick, STAY AT HOME unless otherwise directed by your healthcare provider. 6. Follow directions from state and national officials regarding staying safe   At [facility] we value your feedback. You may receive a survey about your visit today. Please share your experience as we strive to create trusting relationships with our patients to provide genuine, compassionate, quality care.  We appreciate your understanding and patience as we review any laboratory studies, imaging, and other diagnostic tests that are ordered as we care for you. Our office policy is 5 business days  for review of these results, and any emergent or urgent results are addressed in a timely manner for your best interest. If you do not hear from our office in 1 week, please contact us.   We also encourage the use of MyChart, which contains your medical information for your review as well. If you are not enrolled in this feature, an access code is on this after visit summary for your convenience. Thank you for allowing [place] to be involved in your care.  It was great to see you today!  I hope you have a Happy Thanksgiving!!

## 2019-06-16 NOTE — H&P (View-Only) (Signed)
Referring Provider: Redmond School, MD Primary Care Physician:  Redmond School, MD Primary GI:  Dr. Gala Romney  Chief Complaint  Patient presents with  . Dysphagia    after hernia repair; feels like everything stops in chest    HPI:   Robyn Ashley is a 68 y.o. female who presents for follow-up regarding diarrhea.  The patient was last seen in our office 04/14/2019 for GERD, dysphagia, history of IBS, hiatal hernia.  Noted chronic history of GERD, IBS diarrhea type.  Colonoscopy up-to-date 2019 and next due in 2029.  Previous attempts at Zegerid was ineffective.  Switch to Dexilant caused worsening diarrhea.  She was then switched to AcipHex which seemed to initially help.  Noted hiatal hernia and referred to surgeon with complaint surgery in July 2020.  She was at one point trialed on Viberzi which helped however it would cost $1500 a month and the pharmacy recommended Alosetron HCl.  Bentyl not effective.  A prior Auth was initiated for Viberzi through "cover my meds."  At her last visit she noted hiatal hernia surgery went well, pending swallowing test due to persistent dysphagia.  Feels she eats too fast and has been on a reduced speed diet.  Also on full liquid/soft diet with Ensure supplementation.  Some weight loss after surgery.  Plan follow-up with surgery on October 15.  GERD ongoing despite Protonix, currently taking Protonix 3-4 times a week due to flares.  Diarrhea improved but still persistent.  No other overt GI complaints.  Recommended continue current medications, follow-up with surgery based on their recommendations, hold off on diarrhea evaluation until stomach issues resolved, follow-up in 4 months.  Reviewed notes dated 06/12/2019 which indicated tight Nissen wrap and recommended esophageal dilation and possible Kenalog injection.  Upon further review it was found that Kenalog does not really have an application here and EGD with dilation is within the scope of primary GI.   Recommended follow-up with our office for further discussion.  Today she states she's doing ok overall. Most foods getting stuck (dysphagia) with regurgitation. Sometimes cream soups cause an issue. Is eating with a "baby spoon." Frequent regurgitation. Currently drinking cream soups, Ensure; occasionally tries bean soup but hasn't had a good experience yet. Is having some weight loss related to limited intake. Objectively she has lost 8 lbs in 2 months. Stools still loose to paste (Bristol 5-6 stools). Denies abdominal pain, N/V, hematochezia, melena, fever, chills. Denies URI or flu-like symptoms. Denies loss of sense of taste or smell. Denies chest pain, dyspnea, dizziness, lightheadedness, syncope, near syncope. Denies any other upper or lower GI symptoms.  GERD doing well off Pantoprazole.  Past Medical History:  Diagnosis Date  . GERD (gastroesophageal reflux disease)   . Hiatal hernia   . History of degenerative disc disease   . History of nuclear stress test 07/27/2013   in care everywhere per cardiology note by dr Donata Clay:  test normal  . Hypertension   . Intermittent palpitations   . MVP (mitral valve prolapse)    per pt dx 1980s,  last echo done 2016 by dr Donata Clay [facility] cardiology in [place]) and last cardiology visit 11-02-2014 in epic;   01-27-2019 per pt episodic palpitations and takes toprol, currently followed by pcp  . Narcolepsy   . OSA (obstructive sleep apnea)    Not on CPAP  . TMJ arthralgia     Past Surgical History:  Procedure Laterality Date  . ABDOMINAL HYSTERECTOMY    . BIOPSY  06/24/2018   Procedure: BIOPSY;  Surgeon: Daneil Dolin, MD;  [facility];  Service: Endoscopy;;  (gastric) (colon)  . COLONOSCOPY N/A 06/24/2018   Procedure: COLONOSCOPY;  Surgeon: Daneil Dolin, MD;  [facility];  Service: Endoscopy;  Laterality: N/A;  1:45pm  . ESOPHAGOGASTRODUODENOSCOPY N/A 06/24/2018   Procedure: ESOPHAGOGASTRODUODENOSCOPY (EGD);  Surgeon:  Daneil Dolin, MD;  [facility];  Service: Endoscopy;  Laterality: N/A;  . EYE SURGERY Bilateral    rk  . MALONEY DILATION N/A 06/24/2018   Procedure: Venia Minks DILATION;  Surgeon: Daneil Dolin, MD;  [facility];  Service: Endoscopy;  Laterality: N/A;  . POLYPECTOMY  06/24/2018   Procedure: POLYPECTOMY;  Surgeon: Daneil Dolin, MD;  [facility];  Service: Endoscopy;;  colon   . RF ablation of cervical nerves    . ROTATOR CUFF REPAIR Right 11-21-2006  dr supple @[facility]     Current Outpatient Medications  Medication Sig Dispense Refill  . acetaminophen (TYLENOL) 650 MG CR tablet Take 1,300 mg by mouth every 8 (eight) hours as needed for pain.     Marland Kitchen atorvastatin (LIPITOR) 20 MG tablet Take 20 mg by mouth at bedtime.     . Calcium Carbonate-Vitamin D (CALCIUM 600+D PO) Take 1 tablet by mouth daily.    . cholecalciferol (VITAMIN D3) 25 MCG (1000 UT) tablet Take 1,000 Units by mouth daily.    . enalapril-hydrochlorothiazide (VASERETIC) 10-25 MG tablet Take 1 tablet by mouth daily.  1  . FLUoxetine (PROZAC) 10 MG capsule Take 10 mg by mouth daily.   0  . levothyroxine (SYNTHROID, LEVOTHROID) 50 MCG tablet Take 50 mcg by mouth daily.   2  . metoprolol succinate (TOPROL-XL) 50 MG 24 hr tablet Take 50 mg by mouth at bedtime.   10  . Multiple Vitamin (MULTIVITAMIN) tablet Take 1 tablet by mouth daily.    . NON FORMULARY CPAP at night    . phenylephrine (SUDAFED PE) 10 MG TABS tablet Take 10 mg by mouth every 6 (six) hours as needed (allergies).     . valACYclovir (VALTREX) 1000 MG tablet Take 1,000 mg by mouth 2 (two) times daily as needed.      No current facility-administered medications for this visit.     Allergies as of 06/16/2019 - Review Complete 06/16/2019  Allergen Reaction Noted  . Tetracyclines & related Other (See Comments) 01/10/2011  . Aspirin  05/07/2018  . Norvasc [amlodipine besylate]  05/07/2018  . Nsaids  05/07/2018  . Dexilant  [dexlansoprazole] Diarrhea 08/20/2018    Family History  Problem Relation Age of Onset  . Colon cancer Maternal Grandfather   . Colon cancer Paternal Grandmother     Social History   Socioeconomic History  . Marital status: Married    Spouse name: Not on file  . Number of children: Not on file  . Years of education: Not on file  . Highest education level: Not on file  Occupational History  . Not on file  Social Needs  . Financial resource strain: Not on file  . Food insecurity    Worry: Not on file    Inability: Not on file  . Transportation needs    Medical: Not on file    Non-medical: Not on file  Tobacco Use  . Smoking status: Former Smoker    Packs/day: 1.50    Types: Cigarettes    Start date: 1970    Quit date: 2020  Years since quitting: 0.8  . Smokeless tobacco: Never Used  Substance and Sexual Activity  . Alcohol use: Not Currently    Frequency: Never    Comment: none in many years  . Drug use: Never  . Sexual activity: Not on file  Lifestyle  . Physical activity    Days per week: Not on file    Minutes per session: Not on file  . Stress: Not on file  Relationships  . Social Herbalist on phone: Not on file    Gets together: Not on file    Attends religious service: Not on file    Active member of club or organization: Not on file    Attends meetings of clubs or organizations: Not on file    Relationship status: Not on file  Other Topics Concern  . Not on file  Social History Narrative  . Not on file    Review of Systems: General: Negative for anorexia, weight loss, fever, chills, fatigue, weakness. ENT: Negative for hoarseness. Admits dysphagia as per HPI. CV: Negative for chest pain, angina, palpitations, peripheral edema.  Respiratory: Negative for dyspnea at rest, cough, sputum, wheezing.  GI: See history of present illness. Endo: Negative for unusual weight change.  Heme: Negative for bruising or bleeding. Allergy: Negative  for rash or hives.   Physical Exam: BP 106/63   Pulse 64   Temp (!) 96.2 F (35.7 C) (Temporal)   Ht 5' 1.5" (1.562 m)   Wt 133 lb 12.8 oz (60.7 kg)   BMI 24.87 kg/m  General:   Alert and oriented. Pleasant and cooperative. Well-nourished and well-developed.  Eyes:  Without icterus, sclera clear and conjunctiva pink.  Ears:  Normal auditory acuity. Cardiovascular:  S1, S2 present without murmurs appreciated. Extremities without clubbing or edema. Respiratory:  Clear to auscultation bilaterally. No wheezes, rales, or rhonchi. No distress.  Gastrointestinal:  +BS, soft, non-tender and non-distended. No HSM noted. No guarding or rebound. No masses appreciated.  Rectal:  Deferred  Musculoskalatal:  Symmetrical without gross deformities. Neurologic:  Alert and oriented x4;  grossly normal neurologically. Psych:  Alert and cooperative. Normal mood and affect. Heme/Lymph/Immune: No excessive bruising noted.    06/16/2019 1:48 PM   Disclaimer: This note was dictated with voice recognition software. Similar sounding words can inadvertently be transcribed and may not be corrected upon review.

## 2019-06-17 ENCOUNTER — Ambulatory Visit: Payer: Medicare Other | Admitting: Internal Medicine

## 2019-06-17 NOTE — Progress Notes (Signed)
cc'ed to pcp °

## 2019-06-18 DIAGNOSIS — K219 Gastro-esophageal reflux disease without esophagitis: Secondary | ICD-10-CM | POA: Diagnosis not present

## 2019-06-18 DIAGNOSIS — Z23 Encounter for immunization: Secondary | ICD-10-CM | POA: Diagnosis not present

## 2019-06-18 DIAGNOSIS — F909 Attention-deficit hyperactivity disorder, unspecified type: Secondary | ICD-10-CM | POA: Diagnosis not present

## 2019-06-18 DIAGNOSIS — Z6824 Body mass index (BMI) 24.0-24.9, adult: Secondary | ICD-10-CM | POA: Diagnosis not present

## 2019-06-18 DIAGNOSIS — R1310 Dysphagia, unspecified: Secondary | ICD-10-CM | POA: Diagnosis not present

## 2019-06-22 ENCOUNTER — Other Ambulatory Visit: Payer: Self-pay | Admitting: Internal Medicine

## 2019-06-22 DIAGNOSIS — E2839 Other primary ovarian failure: Secondary | ICD-10-CM

## 2019-06-22 DIAGNOSIS — Z1231 Encounter for screening mammogram for malignant neoplasm of breast: Secondary | ICD-10-CM

## 2019-07-13 ENCOUNTER — Other Ambulatory Visit (HOSPITAL_COMMUNITY)
Admission: RE | Admit: 2019-07-13 | Discharge: 2019-07-13 | Disposition: A | Discharge status: Home / Self Care | Payer: Medicare Other | Source: Ambulatory Visit | Attending: Internal Medicine | Admitting: Internal Medicine

## 2019-07-13 DIAGNOSIS — Z01812 Encounter for preprocedural laboratory examination: Secondary | ICD-10-CM | POA: Diagnosis present

## 2019-07-13 DIAGNOSIS — Z20828 Contact with and (suspected) exposure to other viral communicable diseases: Secondary | ICD-10-CM | POA: Insufficient documentation

## 2019-07-13 LAB — SARS CORONAVIRUS 2 (TAT 6-24 HRS): SARS Coronavirus 2: NEGATIVE

## 2019-07-14 ENCOUNTER — Other Ambulatory Visit: Payer: Self-pay

## 2019-07-14 ENCOUNTER — Encounter: Payer: Self-pay | Admitting: Internal Medicine

## 2019-07-14 ENCOUNTER — Ambulatory Visit (HOSPITAL_COMMUNITY)
Admission: RE | Admit: 2019-07-14 | Discharge: 2019-07-14 | Disposition: A | Discharge status: Home / Self Care | Payer: Medicare Other | Attending: Internal Medicine | Admitting: Internal Medicine

## 2019-07-14 ENCOUNTER — Encounter (HOSPITAL_COMMUNITY): Payer: Self-pay | Admitting: Internal Medicine

## 2019-07-14 ENCOUNTER — Encounter (HOSPITAL_COMMUNITY)
Admission: RE | Disposition: A | Discharge status: Home / Self Care | Payer: Self-pay | Source: Home / Self Care | Attending: Internal Medicine

## 2019-07-14 DIAGNOSIS — T18128A Food in esophagus causing other injury, initial encounter: Secondary | ICD-10-CM | POA: Diagnosis not present

## 2019-07-14 DIAGNOSIS — Z886 Allergy status to analgesic agent status: Secondary | ICD-10-CM | POA: Insufficient documentation

## 2019-07-14 DIAGNOSIS — G47419 Narcolepsy without cataplexy: Secondary | ICD-10-CM | POA: Diagnosis not present

## 2019-07-14 DIAGNOSIS — R634 Abnormal weight loss: Secondary | ICD-10-CM | POA: Diagnosis not present

## 2019-07-14 DIAGNOSIS — Z79899 Other long term (current) drug therapy: Secondary | ICD-10-CM | POA: Diagnosis not present

## 2019-07-14 DIAGNOSIS — Z881 Allergy status to other antibiotic agents status: Secondary | ICD-10-CM | POA: Insufficient documentation

## 2019-07-14 DIAGNOSIS — Z888 Allergy status to other drugs, medicaments and biological substances status: Secondary | ICD-10-CM | POA: Diagnosis not present

## 2019-07-14 DIAGNOSIS — X58XXXA Exposure to other specified factors, initial encounter: Secondary | ICD-10-CM | POA: Insufficient documentation

## 2019-07-14 DIAGNOSIS — Z87891 Personal history of nicotine dependence: Secondary | ICD-10-CM | POA: Insufficient documentation

## 2019-07-14 DIAGNOSIS — K580 Irritable bowel syndrome with diarrhea: Secondary | ICD-10-CM | POA: Insufficient documentation

## 2019-07-14 DIAGNOSIS — I341 Nonrheumatic mitral (valve) prolapse: Secondary | ICD-10-CM | POA: Insufficient documentation

## 2019-07-14 DIAGNOSIS — R1310 Dysphagia, unspecified: Secondary | ICD-10-CM | POA: Diagnosis present

## 2019-07-14 DIAGNOSIS — K219 Gastro-esophageal reflux disease without esophagitis: Secondary | ICD-10-CM | POA: Insufficient documentation

## 2019-07-14 DIAGNOSIS — I10 Essential (primary) hypertension: Secondary | ICD-10-CM | POA: Insufficient documentation

## 2019-07-14 DIAGNOSIS — G4733 Obstructive sleep apnea (adult) (pediatric): Secondary | ICD-10-CM | POA: Diagnosis not present

## 2019-07-14 HISTORY — PX: MALONEY DILATION: SHX5535

## 2019-07-14 HISTORY — PX: ESOPHAGOGASTRODUODENOSCOPY: SHX5428

## 2019-07-14 SURGERY — EGD (ESOPHAGOGASTRODUODENOSCOPY)
Anesthesia: Moderate Sedation

## 2019-07-14 MED ORDER — LIDOCAINE VISCOUS HCL 2 % MT SOLN
OROMUCOSAL | Status: AC
  Filled 2019-07-14: qty 15

## 2019-07-14 MED ORDER — LIDOCAINE VISCOUS HCL 2 % MT SOLN
OROMUCOSAL | Status: DC | PRN
  Administered 2019-07-14: 4 mL via OROMUCOSAL

## 2019-07-14 MED ORDER — ONDANSETRON HCL 4 MG/2ML IJ SOLN
INTRAMUSCULAR | Status: DC | PRN
  Administered 2019-07-14: 4 mg via INTRAVENOUS

## 2019-07-14 MED ORDER — STERILE WATER FOR IRRIGATION IR SOLN
Status: DC | PRN
  Administered 2019-07-14: 1.5 mL

## 2019-07-14 MED ORDER — MEPERIDINE HCL 100 MG/ML IJ SOLN
INTRAMUSCULAR | Status: DC | PRN
  Administered 2019-07-14: 15 mg via INTRAVENOUS
  Administered 2019-07-14: 25 mg via INTRAVENOUS

## 2019-07-14 MED ORDER — MIDAZOLAM HCL 5 MG/5ML IJ SOLN
INTRAMUSCULAR | Status: DC | PRN
  Administered 2019-07-14: 1 mg via INTRAVENOUS
  Administered 2019-07-14: 2 mg via INTRAVENOUS
  Administered 2019-07-14: 1 mg via INTRAVENOUS

## 2019-07-14 MED ORDER — ONDANSETRON HCL 4 MG/2ML IJ SOLN
INTRAMUSCULAR | Status: AC
  Filled 2019-07-14: qty 2

## 2019-07-14 MED ORDER — MEPERIDINE HCL 50 MG/ML IJ SOLN
INTRAMUSCULAR | Status: AC
  Filled 2019-07-14: qty 1

## 2019-07-14 MED ORDER — SODIUM CHLORIDE 0.9 % IV SOLN: INTRAVENOUS | Status: DC

## 2019-07-14 MED ORDER — MIDAZOLAM HCL 5 MG/5ML IJ SOLN
INTRAMUSCULAR | Status: AC
  Filled 2019-07-14: qty 10

## 2019-07-14 NOTE — Op Note (Signed)
[facility] Patient Name: Robyn Ashley Procedure Date: 07/14/2019 10:47 AM MRN: [id number] Date of Birth: 21-Dec-1950 Attending MD: Norvel Richards , MD CSN: [id number] Age: 68 Admit Type: Outpatient Procedure:                Upper GI endoscopy Indications:              Dysphagia Providers:                Norvel Richards, MD, Jeanann Lewandowsky. Sharon Seller, RN,                            Nelma Rothman, Technician Referring MD:              Medicines:                Midazolam 4 mg IV, Meperidine 40 mg IV Complications:            No immediate complications. Estimated Blood Loss:     Estimated blood loss was minimal. Procedure:                Pre-Anesthesia Assessment:                           - Prior to the procedure, a History and Physical                            was performed, and patient medications and                            allergies were reviewed. The patient's tolerance of                            previous anesthesia was also reviewed. The risks                            and benefits of the procedure and the sedation                            options and risks were discussed with the patient.                            All questions were answered, and informed consent                            was obtained. ASA Grade Assessment: II - A patient                            with mild systemic disease. After reviewing the                            risks and benefits, the patient was deemed in                            satisfactory condition to undergo the procedure.  After obtaining informed consent, the endoscope was                            passed under direct vision. Throughout the                            procedure, the patient's blood pressure, pulse, and                            oxygen saturations were monitored continuously. The                            [id number]) scope was introduced through the                             mouth, and advanced to the second part of duodenum.                            The upper GI endoscopy was accomplished without                            difficulty. The patient tolerated the procedure                            well. Scope In: 11:14:48 AM Scope Out: 11:24:38 AM Total Procedure Duration: 0 hours 9 minutes 50 seconds  Findings:      Meat bolus ball valving in the distal esophagus. Narrowed EG junction -       fairly easily traversed with the gastroscope. The food bolus was pushed       into the stomach. Intact wrap. Gastric mucosa otherwise appeared normal.       Patent pylorus.      The duodenal bulb and second portion of the duodenum were normal. I       retrieved retrieved the meat bolus with the Jabier Mutton net and removed it from       the patient.. Subsequently, I passed a 41 French Maloney dilation to       full insertion with mild resistance-then a 52 French Maloney dilator       with slightly more mild resistance. A look back revealed aperture of the       esophagus at the GE junction appeared to be opened up a bit more than       predilation; there was minimal bleeding - no apparent complication.       Retroflexion view from the stomach demonstrated the wrap remained intact       but slightly looser than predilation. Impression:               -Narrowed GE junction as described above. Occult                            food impaction. Status post disimpaction and                            Maloney dilation                           -  No specimens collected. Moderate Sedation:      Moderate (conscious) sedation was administered by the endoscopy nurse       and supervised by the endoscopist. The following parameters were       monitored: oxygen saturation, heart rate, blood pressure, respiratory       rate, EKG, adequacy of pulmonary ventilation, and response to care.       Total physician intraservice time was 13 minutes. Recommendation:           - Patient has a  contact number available for                            emergencies. The signs and symptoms of potential                            delayed complications were discussed with the                            patient. Return to normal activities tomorrow.                            Written discharge instructions were provided to the                            patient.                           - Advance diet as tolerated.                           - Continue present medications.                           - Return to GI clinic in 3 months. Procedure Code(s):        --- Professional ---                           [phone], Esophagogastroduodenoscopy, flexible,                            transoral; diagnostic, including collection of                            specimen(s) by brushing or washing, when performed                            (separate procedure)                           G0500, Moderate sedation services provided by the                            same physician or other qualified health care                            professional performing a gastrointestinal  endoscopic service that sedation supports,                            requiring the presence of an independent trained                            observer to assist in the monitoring of the                            patient's level of consciousness and physiological                            status; initial 15 minutes of intra-service time;                            patient age 54 years or older (additional time may                            be reported with [phone], as appropriate) Diagnosis Code(s):        --- Professional ---                           R13.10, Dysphagia, unspecified CPT copyright 2019 American Medical Association. All rights reserved. The codes documented in this report are preliminary and upon coder review may  be revised to meet current compliance requirements. Cristopher Estimable. Eltha Tingley,  MD Norvel Richards, MD 07/14/2019 11:39:56 AM This report has been signed electronically. Number of Addenda: 0

## 2019-07-14 NOTE — Discharge Instructions (Signed)
EGD Discharge instructions Please read the instructions outlined below and refer to this sheet in the next few weeks. These discharge instructions provide you with general information on caring for yourself after you leave the hospital. Your doctor may also give you specific instructions. While your treatment has been planned according to the most current medical practices available, unavoidable complications occasionally occur. If you have any problems or questions after discharge, please call your doctor. ACTIVITY  You may resume your regular activity but move at a slower pace for the next 24 hours.   Take frequent rest periods for the next 24 hours.   Walking will help expel (get rid of) the air and reduce the bloated feeling in your abdomen.   No driving for 24 hours (because of the anesthesia (medicine) used during the test).   You may shower.   Do not sign any important legal documents or operate any machinery for 24 hours (because of the anesthesia used during the test).  NUTRITION  Drink plenty of fluids.   You may resume your normal diet.   Begin with a light meal and progress to your normal diet.   Avoid alcoholic beverages for 24 hours or as instructed by your caregiver.  MEDICATIONS  You may resume your normal medications unless your caregiver tells you otherwise.  WHAT YOU CAN EXPECT TODAY  You may experience abdominal discomfort such as a feeling of fullness or "gas" pains.  FOLLOW-UP  Your doctor will discuss the results of your test with you.  SEEK IMMEDIATE MEDICAL ATTENTION IF ANY OF THE FOLLOWING OCCUR:  Excessive nausea (feeling sick to your stomach) and/or vomiting.   Severe abdominal pain and distention (swelling).   Trouble swallowing.   Temperature over 101 F (37.8 C).   Rectal bleeding or vomiting of blood.    Gradually advance diet as tolerated over the next 24 hours starting with a clear liquid lunch  Office visit with [place] in 3 months  At  patient request, I called Fritz Pickerel, husband at [phone] results

## 2019-07-14 NOTE — Interval H&P Note (Signed)
History and Physical Interval Note:  07/14/2019 11:07 AM  Bobbe Medico  has presented today for surgery, with the diagnosis of dysphagia.  The various methods of treatment have been discussed with the patient and family. After consideration of risks, benefits and other options for treatment, the patient has consented to  Procedure(s) with comments: ESOPHAGOGASTRODUODENOSCOPY (EGD) (N/A) - 3:00pm MALONEY DILATION (N/A) as a surgical intervention.  The patient's history has been reviewed, patient examined, no change in status, stable for surgery.  I have reviewed the patient's chart and labs.  Questions were answered to the patient's satisfaction.     Tilmon Wisehart  No change.  EGD with esophageal dilation as feasible/appropriate per plan.  The risks, benefits, limitations, alternatives and imponderables have been reviewed with the patient. Potential for esophageal dilation, biopsy, etc. have also been reviewed.  Questions have been answered. All parties agreeable.

## 2019-07-16 ENCOUNTER — Telehealth: Payer: Self-pay

## 2019-07-16 DIAGNOSIS — R1310 Dysphagia, unspecified: Secondary | ICD-10-CM

## 2019-07-16 NOTE — Telephone Encounter (Signed)
VM received 07/16/2019. On VM, pt states her symptoms are not improved from her EGD on 07/14/2019. Lmom, waiting on a return call.

## 2019-07-17 ENCOUNTER — Telehealth: Payer: Self-pay | Admitting: Internal Medicine

## 2019-07-17 NOTE — Telephone Encounter (Signed)
Pt returned call. Pt had her EGD 07/14/2019. Pt states that she isn't swallowing any better since her procedure. Protein drinks aren't going down. Pt gets pressure that builds up in her chest and will vomit the anything other than thick liquids. If pt drinks her drink fast, she will vomit as well. Pt isn't sure if she needs to give it a little more time since she just has her procedure on 07/14/19. Pt is due to come back in the office in March. Pt would like a little guidance on what to do. Pt is concerned since she's lost so much weight and is at 129 lb now. Please advise.

## 2019-07-17 NOTE — Telephone Encounter (Signed)
Message sent to provider. See other phone note.  

## 2019-07-17 NOTE — Telephone Encounter (Signed)
Pt was returning call. [phone]

## 2019-07-21 NOTE — Telephone Encounter (Signed)
Per [facility] Sched[facility], unable to schedule BPE until beginning of next week d/t Dr. [name] Papas is off and they do not have schedule for next week at this time.  Called and informed pt I will contact her next week when BPE can be scheduled. Advised her to do full liquid diet in the meantime. Verbalized understanding.

## 2019-07-21 NOTE — Telephone Encounter (Signed)
Patient with [facility] 01/2019. Found that wrap seems too tight and has post-op dysphagia. Follow-up with surg recommended EGD w/ dilation and/or Kenalog injection (tho I couldn't find an application for Kenalog in this scenario per my last note)  Underwent EGD w/ dilation, noted meat bolus in esophagus. S/p dilation. Still with significant dysphagia as per patient call below.   RMR: is there anything else we can offer locally (nothing I can think of)? My feeling is she likely needs to go back to surg to address.  CC: MB

## 2019-07-21 NOTE — Telephone Encounter (Signed)
Patient needs a barium pill esophagram(repeat)  If pill passage continues to be impeded, I would conflict conclude wrap is too tight and it failed to improve with esophageal dilation.  If that were to be the case, will need to go back to the surgeon.  Full liquid diet in the interim.

## 2019-07-21 NOTE — Telephone Encounter (Signed)
Received VM this morning from pt saying her swallowing hasn't improved.  Called pt, she has been drinking protein shakes. If she takes couple small sips and waits before taking more sips she does ok. However, if she drinks too much at a time she will regurgitate. If she tries to eat she will feel pressure in chest for 30 minutes-1 hour afterwards, 9 times out of 10 it will then come back up. She is aware AM is off this week and EG returned to office today. Advised her I would let EG know for any further recommendations.

## 2019-07-21 NOTE — Addendum Note (Signed)
Addended by: Zara Council C on: 07/21/2019 11:02 AM   Modules accepted: Orders

## 2019-07-22 NOTE — Telephone Encounter (Signed)
Noted  

## 2019-07-27 NOTE — Telephone Encounter (Signed)
BPE scheduled for 07/30/19 at 9:30am, arrive at 9:15am. NPO for 3 hours before test.  Called pt to inform of appt, she wanted info given to spouse. Spouse informed of appt. Letter mailed.

## 2019-07-30 ENCOUNTER — Ambulatory Visit (HOSPITAL_COMMUNITY)
Admission: RE | Admit: 2019-07-30 | Discharge: 2019-07-30 | Disposition: A | Discharge status: Home / Self Care | Payer: Medicare Other | Source: Ambulatory Visit | Attending: Internal Medicine | Admitting: Internal Medicine

## 2019-07-30 ENCOUNTER — Other Ambulatory Visit: Payer: Self-pay

## 2019-07-30 ENCOUNTER — Other Ambulatory Visit: Payer: Self-pay | Admitting: Internal Medicine

## 2019-07-30 DIAGNOSIS — I10 Essential (primary) hypertension: Secondary | ICD-10-CM | POA: Diagnosis not present

## 2019-07-30 DIAGNOSIS — R1310 Dysphagia, unspecified: Secondary | ICD-10-CM | POA: Diagnosis present

## 2019-07-30 DIAGNOSIS — E7849 Other hyperlipidemia: Secondary | ICD-10-CM | POA: Diagnosis not present

## 2019-07-30 DIAGNOSIS — E063 Autoimmune thyroiditis: Secondary | ICD-10-CM | POA: Diagnosis not present

## 2019-07-30 DIAGNOSIS — F329 Major depressive disorder, single episode, unspecified: Secondary | ICD-10-CM | POA: Diagnosis not present

## 2019-08-04 ENCOUNTER — Telehealth: Payer: Self-pay | Admitting: Internal Medicine

## 2019-08-04 ENCOUNTER — Encounter: Payer: Self-pay | Admitting: Gastroenterology

## 2019-08-04 NOTE — Telephone Encounter (Signed)
Thanks; will await input from Dr. Silverio Decamp.

## 2019-08-04 NOTE — Telephone Encounter (Signed)
Dr. Windle Guard, thanks for your input. We will send this chain of staff messages to Dr. Rush Landmark to see what he thinks about this case.

## 2019-08-04 NOTE — Telephone Encounter (Signed)
-----   Message from Clovis Riley, MD sent at 08/04/2019  9:59 AM EST ----- Regarding: RE: Wrap too tight Thanks for your message, yes I agree if Dr. Rush Landmark has any additional endoscopic approaches I would be happy to have him try (and  I think she would prefer not to have another surgery). If not I will plan to take down her wrap.   Romana Juniper ----- Message ----- From: Daneil Dolin, MD Sent: 08/04/2019   9:35 AM EST To: Clovis Riley, MD Subject: Wrap too tight                                 Dear Dr. Arnoldo Morale mutual patient, Ms. Roeder, continues to have significant esophageal dysphagia after having her fundoplication.  I dilated her GE junction up to 32 [place] with [facility] dilators recently (incidentally, I removed an occult meat bolus from her esophagus at the same time).  Clinically, no change in symptoms.  Follow-up barium study demonstrated persistence of a tight, obstructing GE junction.  I was thinking about asking Dr. Rodman Comp with [facility], if he had anything to offer endoscopically, such as achalasia bag-type dilation or other endoscopic modality prior to sending her back to you for surgical revision.I would appreciate your thoughts.Milton Ferguson

## 2019-08-04 NOTE — Telephone Encounter (Signed)
Ok, we will start with esophageal manometry.  Beth, can you please help schedule it soon? Happy New Year!  Margarette Asal

## 2019-08-04 NOTE — Telephone Encounter (Signed)
Error

## 2019-08-04 NOTE — Telephone Encounter (Signed)
Dear Dr. Silverio Decamp:  Please proceed with esophageal manometry, etc. as you deem appropriate..  I do not see a preoperative manometric study in the record.  Thanks for your help.  Happy new year!  Milton Ferguson

## 2019-08-04 NOTE — Telephone Encounter (Signed)
Dr. Gala Romney and Dr. Windle Guard, Thank you for reaching out. Difficult situation. I actually do not perform Pneumatic Dilations. I don't necessarily have anything in particular that is data driven. I think another dilation to 20 mm with a balloon and consideration of Botox would not be unreasonable, but not clear that it will be overly effective. I have seen some reports of esophageal stents being placed, but there is not great data that this will be an effective treatment overall and migration rates are likely to be high. I'd like to put Dr. Silverio Decamp on this thread, when she returns to the office, maybe she could shed some thoughts/light.  Not sure if she would consider a Manometry to rule out other etiologies of dysphagia before any other potential treatments or surgery.  Veena, appreciate your experience and thoughts. GM

## 2019-08-04 NOTE — Telephone Encounter (Signed)
Esophageal manometry will help to better understand if she has any underlying major esophageal motility disorder.  I do the Pneumatic dilations predominantly for achalasia patients, occasionally have done for EGJ outflow obstruction.   It is reasonable to do pneumatic dilation if she has adequate esophageal contractions .    I am happy to see her in office visit  after esophageal manometry if it appears that she will benefit from pneumatic dilation to go over the procedure and potential risks in detail. Thanks VN

## 2019-08-04 NOTE — Telephone Encounter (Signed)
Dr Gala Romney,  If patient is willing to proceed with esophageal manometry, will help schedule it and proceed from there. Please let me know.  Thanks [employer]

## 2019-08-10 ENCOUNTER — Other Ambulatory Visit: Payer: Self-pay

## 2019-08-10 DIAGNOSIS — Z1159 Encounter for screening for other viral diseases: Secondary | ICD-10-CM

## 2019-08-10 DIAGNOSIS — Z01818 Encounter for other preprocedural examination: Secondary | ICD-10-CM

## 2019-08-10 NOTE — Telephone Encounter (Signed)
Patient is contacted with the appointment date of 08/19/2019 at 10:30 am. COVID screening at [facility] 08/14/19. Questions invited and answered.

## 2019-08-14 ENCOUNTER — Other Ambulatory Visit (HOSPITAL_COMMUNITY)
Admission: RE | Admit: 2019-08-14 | Discharge: 2019-08-14 | Disposition: A | Discharge status: Home / Self Care | Payer: Medicare Other | Source: Ambulatory Visit | Attending: Gastroenterology | Admitting: Gastroenterology

## 2019-08-14 ENCOUNTER — Other Ambulatory Visit: Payer: Self-pay

## 2019-08-14 ENCOUNTER — Other Ambulatory Visit (HOSPITAL_COMMUNITY): Payer: Medicare Other

## 2019-08-14 DIAGNOSIS — Z20822 Contact with and (suspected) exposure to covid-19: Secondary | ICD-10-CM | POA: Diagnosis not present

## 2019-08-14 DIAGNOSIS — Z01812 Encounter for preprocedural laboratory examination: Secondary | ICD-10-CM | POA: Insufficient documentation

## 2019-08-14 LAB — SARS CORONAVIRUS 2 (TAT 6-24 HRS): SARS Coronavirus 2: NEGATIVE

## 2019-08-18 ENCOUNTER — Ambulatory Visit: Payer: Medicare Other | Admitting: Nurse Practitioner

## 2019-08-18 NOTE — Progress Notes (Signed)
preop call completed.  Answered questions. Has quarantined. Arrival time 1030 am

## 2019-08-19 ENCOUNTER — Encounter (HOSPITAL_COMMUNITY)
Admission: RE | Disposition: A | Discharge status: Home / Self Care | Payer: Self-pay | Source: Home / Self Care | Attending: Gastroenterology

## 2019-08-19 ENCOUNTER — Ambulatory Visit (HOSPITAL_COMMUNITY)
Admission: RE | Admit: 2019-08-19 | Discharge: 2019-08-19 | Disposition: A | Discharge status: Home / Self Care | Payer: Medicare Other | Attending: Gastroenterology | Admitting: Gastroenterology

## 2019-08-19 DIAGNOSIS — Z5309 Procedure and treatment not carried out because of other contraindication: Secondary | ICD-10-CM | POA: Diagnosis not present

## 2019-08-19 DIAGNOSIS — R1310 Dysphagia, unspecified: Secondary | ICD-10-CM | POA: Diagnosis not present

## 2019-08-19 SURGERY — INVASIVE LAB ABORTED CASE

## 2019-08-19 MED ORDER — LIDOCAINE VISCOUS HCL 2 % MT SOLN
OROMUCOSAL | Status: AC
  Filled 2019-08-19: qty 15

## 2019-08-19 SURGICAL SUPPLY — 2 items
FACESHIELD LNG OPTICON STERILE (SAFETY) IMPLANT
GLOVE BIO SURGEON STRL SZ8 (GLOVE) ×6 IMPLANT

## 2019-08-19 NOTE — Progress Notes (Signed)
Patient arrive to unit for esophageal manometry.  Preop assessment completed.  Manometry probed placed per protocol without complication.  However, patient unable to stop heaving and vomiting clear sputum.  Second manometry RN called to room to assess placement and assist.  Patient continue to be unable to tolerate probe placement.  Manometry probe removed.  Patient states she would not be able to tolerate another attempt.  Made Dr. Silverio Decamp aware.

## 2019-08-24 ENCOUNTER — Other Ambulatory Visit (HOSPITAL_COMMUNITY): Payer: Medicare Other

## 2019-08-28 ENCOUNTER — Other Ambulatory Visit: Payer: Self-pay

## 2019-08-28 DIAGNOSIS — R1310 Dysphagia, unspecified: Secondary | ICD-10-CM

## 2019-08-31 ENCOUNTER — Other Ambulatory Visit: Payer: Self-pay

## 2019-08-31 ENCOUNTER — Telehealth: Payer: Self-pay | Admitting: Gastroenterology

## 2019-08-31 ENCOUNTER — Other Ambulatory Visit (HOSPITAL_COMMUNITY)
Admission: RE | Admit: 2019-08-31 | Discharge: 2019-08-31 | Disposition: A | Discharge status: Home / Self Care | Payer: Medicare Other | Source: Ambulatory Visit | Attending: Gastroenterology | Admitting: Gastroenterology

## 2019-08-31 DIAGNOSIS — R1310 Dysphagia, unspecified: Secondary | ICD-10-CM

## 2019-08-31 DIAGNOSIS — Z20822 Contact with and (suspected) exposure to covid-19: Secondary | ICD-10-CM | POA: Insufficient documentation

## 2019-08-31 DIAGNOSIS — Z01812 Encounter for preprocedural laboratory examination: Secondary | ICD-10-CM | POA: Insufficient documentation

## 2019-08-31 LAB — SARS CORONAVIRUS 2 (TAT 6-24 HRS): SARS Coronavirus 2: NEGATIVE

## 2019-08-31 NOTE — Telephone Encounter (Signed)
Patient is scheduled for EGD with dilation. She will then have the manometry same day.  Urgent ambulatory referral for both procedures sent.  Covid screening today. She chooses [facility] testing site. Arrive to [facility] 09/02/19 at 10:30 am. Have a driver. Fasting after midnight.

## 2019-08-31 NOTE — Telephone Encounter (Signed)
Patient is calling in reference to an appointment for procedure.

## 2019-09-01 ENCOUNTER — Encounter (HOSPITAL_COMMUNITY): Payer: Self-pay | Admitting: Gastroenterology

## 2019-09-02 ENCOUNTER — Other Ambulatory Visit: Payer: Self-pay

## 2019-09-02 ENCOUNTER — Ambulatory Visit (HOSPITAL_COMMUNITY)
Admission: RE | Admit: 2019-09-02 | Discharge: 2019-09-02 | Disposition: A | Discharge status: Home / Self Care | Payer: Medicare Other | Attending: Gastroenterology | Admitting: Gastroenterology

## 2019-09-02 ENCOUNTER — Encounter (HOSPITAL_COMMUNITY)
Admission: RE | Disposition: A | Discharge status: Home / Self Care | Payer: Self-pay | Source: Home / Self Care | Attending: Gastroenterology

## 2019-09-02 ENCOUNTER — Ambulatory Visit (HOSPITAL_COMMUNITY): Payer: Medicare Other | Admitting: Anesthesiology

## 2019-09-02 ENCOUNTER — Encounter (HOSPITAL_COMMUNITY): Payer: Self-pay | Admitting: Gastroenterology

## 2019-09-02 DIAGNOSIS — R1310 Dysphagia, unspecified: Secondary | ICD-10-CM | POA: Diagnosis not present

## 2019-09-02 DIAGNOSIS — Z9889 Other specified postprocedural states: Secondary | ICD-10-CM | POA: Insufficient documentation

## 2019-09-02 DIAGNOSIS — Z79899 Other long term (current) drug therapy: Secondary | ICD-10-CM | POA: Insufficient documentation

## 2019-09-02 DIAGNOSIS — Z888 Allergy status to other drugs, medicaments and biological substances status: Secondary | ICD-10-CM | POA: Diagnosis not present

## 2019-09-02 DIAGNOSIS — Z79890 Hormone replacement therapy (postmenopausal): Secondary | ICD-10-CM | POA: Diagnosis not present

## 2019-09-02 DIAGNOSIS — E039 Hypothyroidism, unspecified: Secondary | ICD-10-CM | POA: Diagnosis not present

## 2019-09-02 DIAGNOSIS — Z886 Allergy status to analgesic agent status: Secondary | ICD-10-CM | POA: Diagnosis not present

## 2019-09-02 DIAGNOSIS — G47419 Narcolepsy without cataplexy: Secondary | ICD-10-CM | POA: Insufficient documentation

## 2019-09-02 DIAGNOSIS — Z87891 Personal history of nicotine dependence: Secondary | ICD-10-CM | POA: Diagnosis not present

## 2019-09-02 DIAGNOSIS — I341 Nonrheumatic mitral (valve) prolapse: Secondary | ICD-10-CM | POA: Diagnosis not present

## 2019-09-02 DIAGNOSIS — K449 Diaphragmatic hernia without obstruction or gangrene: Secondary | ICD-10-CM | POA: Diagnosis not present

## 2019-09-02 DIAGNOSIS — K228 Other specified diseases of esophagus: Secondary | ICD-10-CM

## 2019-09-02 DIAGNOSIS — K219 Gastro-esophageal reflux disease without esophagitis: Secondary | ICD-10-CM | POA: Diagnosis not present

## 2019-09-02 DIAGNOSIS — I10 Essential (primary) hypertension: Secondary | ICD-10-CM | POA: Insufficient documentation

## 2019-09-02 DIAGNOSIS — G4733 Obstructive sleep apnea (adult) (pediatric): Secondary | ICD-10-CM | POA: Diagnosis not present

## 2019-09-02 DIAGNOSIS — Z881 Allergy status to other antibiotic agents status: Secondary | ICD-10-CM | POA: Diagnosis not present

## 2019-09-02 HISTORY — PX: ESOPHAGOGASTRODUODENOSCOPY (EGD) WITH PROPOFOL: SHX5813

## 2019-09-02 HISTORY — PX: ESOPHAGEAL DILATION: SHX303

## 2019-09-02 HISTORY — PX: ESOPHAGEAL MANOMETRY: SHX5429

## 2019-09-02 SURGERY — MANOMETRY, ESOPHAGUS
Anesthesia: Choice

## 2019-09-02 SURGERY — ESOPHAGOGASTRODUODENOSCOPY (EGD) WITH PROPOFOL
Anesthesia: Monitor Anesthesia Care

## 2019-09-02 MED ORDER — PROPOFOL 10 MG/ML IV BOLUS
INTRAVENOUS | Status: DC | PRN
  Administered 2019-09-02: 80 mg via INTRAVENOUS

## 2019-09-02 MED ORDER — LACTATED RINGERS IV SOLN: INTRAVENOUS | Status: DC | PRN

## 2019-09-02 MED ORDER — SODIUM CHLORIDE 0.9 % IV SOLN: INTRAVENOUS | Status: DC

## 2019-09-02 MED ORDER — LIDOCAINE 2% (20 MG/ML) 5 ML SYRINGE
INTRAMUSCULAR | Status: DC | PRN
  Administered 2019-09-02: 80 mg via INTRAVENOUS

## 2019-09-02 MED ORDER — PROPOFOL 500 MG/50ML IV EMUL
INTRAVENOUS | Status: DC | PRN
  Administered 2019-09-02: 100 ug/kg/min via INTRAVENOUS

## 2019-09-02 SURGICAL SUPPLY — 15 items

## 2019-09-02 SURGICAL SUPPLY — 2 items
FACESHIELD LNG OPTICON STERILE (SAFETY) IMPLANT
GLOVE BIO SURGEON STRL SZ8 (GLOVE) ×4 IMPLANT

## 2019-09-02 NOTE — H&P (Signed)
[facility] History and Physical   Primary Care Physician:  Redmond School, MD   Reason for Procedure:  Dysphagia  Plan:    EGD with esophageal dilation and placement of esophageal manometry catheter    HPI: Robyn Ashley is a 69 y.o. female with history of GERD, hiatal hernia s/p Nissen fundoplication with complaints of dysphagia. We will plan for EGD with esophageal dilation and placement of esophageal manometry catheter to assess esophageal motility.   Past Medical History:  Diagnosis Date  . GERD (gastroesophageal reflux disease)   . Hiatal hernia   . History of degenerative disc disease   . History of nuclear stress test 07/27/2013   in care everywhere per cardiology note by dr Donata Clay:  test normal  . Hypertension   . Intermittent palpitations   . MVP (mitral valve prolapse)    per pt dx 1980s,  last echo done 2016 by dr Donata Clay [facility] cardiology in [place]) and last cardiology visit 11-02-2014 in epic;   01-27-2019 per pt episodic palpitations and takes toprol, currently followed by pcp  . Narcolepsy   . OSA (obstructive sleep apnea)    Not on CPAP  . TMJ arthralgia     Past Surgical History:  Procedure Laterality Date  . ABDOMINAL HYSTERECTOMY    . BIOPSY  06/24/2018   Procedure: BIOPSY;  Surgeon: Daneil Dolin, MD;  [facility];  Service: Endoscopy;;  (gastric) (colon)  . COLONOSCOPY N/A 06/24/2018   Procedure: COLONOSCOPY;  Surgeon: Daneil Dolin, MD;  [facility]: [facility];  Service: Endoscopy;  Laterality: N/A;  1:45pm  . ESOPHAGOGASTRODUODENOSCOPY N/A 06/24/2018   Procedure: ESOPHAGOGASTRODUODENOSCOPY (EGD);  Surgeon: Daneil Dolin, MD;  [facility]: [facility];  Service: Endoscopy;  Laterality: N/A;  . ESOPHAGOGASTRODUODENOSCOPY N/A 07/14/2019   Procedure: ESOPHAGOGASTRODUODENOSCOPY (EGD);  Surgeon: Daneil Dolin, MD;  [facility];  Service: Endoscopy;  Laterality: N/A;  3:00pm  . EYE SURGERY Bilateral    rk  .  MALONEY DILATION N/A 06/24/2018   Procedure: Venia Minks DILATION;  Surgeon: Daneil Dolin, MD;  [facility];  Service: Endoscopy;  Laterality: N/A;  . Venia Minks DILATION N/A 07/14/2019   Procedure: Venia Minks DILATION;  Surgeon: Daneil Dolin, MD;  [facility];  Service: Endoscopy;  Laterality: N/A;  . POLYPECTOMY  06/24/2018   Procedure: POLYPECTOMY;  Surgeon: Daneil Dolin, MD;  [facility]: [facility];  Service: Endoscopy;;  colon   . RF ablation of cervical nerves    . ROTATOR CUFF REPAIR Right 11-21-2006  dr supple @[facility]     Prior to Admission medications   Medication Sig Start Date End Date Taking? Authorizing Provider  acetaminophen (TYLENOL) 650 MG CR tablet Take 1,300 mg by mouth every 8 (eight) hours as needed for pain.    Yes [provider]  atorvastatin (LIPITOR) 20 MG tablet Take 20 mg by mouth at bedtime.    Yes [provider]  enalapril-hydrochlorothiazide (VASERETIC) 10-25 MG tablet Take 1 tablet by mouth daily. 03/06/18  Yes [provider]  FLUoxetine (PROZAC) 10 MG capsule Take 10 mg by mouth daily.  04/01/18  Yes [provider]  levothyroxine (SYNTHROID, LEVOTHROID) 50 MCG tablet Take 50 mcg by mouth daily.  04/26/18  Yes [provider]  methylphenidate (RITALIN) 20 MG tablet Take 20 mg by mouth 3 (three) times daily.   Yes [provider]  metoprolol succinate (TOPROL-XL) 50 MG 24 hr tablet Take 50 mg by mouth  at bedtime.  04/03/18  Yes [provider]  phenylephrine (SUDAFED PE) 10 MG TABS tablet Take 10 mg by mouth every 6 (six) hours as needed (allergies).    Yes [provider]  Calcium Carbonate-Vitamin D (CALCIUM 600+D PO) Take 1 tablet by mouth daily.    [provider]  cholecalciferol (VITAMIN D3) 25 MCG (1000 UT) tablet Take 1,000 Units by mouth daily.    [provider]  Multiple Vitamin (MULTIVITAMIN) tablet Take 1 tablet by mouth daily.    [provider]  NON FORMULARY CPAP at night    [provider]  valACYclovir (VALTREX) 1000 MG tablet Take 1,000 mg by mouth 2 (two) times daily as needed (outbreak).     [provider]    Current Facility-Administered Medications  Medication Dose Route Frequency Provider Last Rate Last Admin  . 0.9 %  sodium chloride infusion   Intravenous Continuous Mauri Pole, MD        Allergies as of 08/28/2019 - Review Complete 08/19/2019  Allergen Reaction Noted  . Tetracyclines & related Other (See Comments) 01/10/2011  . Aspirin  05/07/2018  . Norvasc [amlodipine besylate]  05/07/2018  . Nsaids  05/07/2018  . Dexilant [dexlansoprazole] Diarrhea 08/20/2018    Family History  Problem Relation Age of Onset  . Colon cancer Maternal Grandfather   . Colon cancer Paternal Grandmother     Social History   Socioeconomic History  . Marital status: Married    Spouse name: Not on file  . Number of children: Not on file  . Years of education: Not on file  . Highest education level: Not on file  Occupational History  . Not on file  Tobacco Use  . Smoking status: Former Smoker    Packs/day: 1.50    Types: Cigarettes    Start date: 1970    Quit date: 07/1997    Years since quitting: 22.1  . Smokeless tobacco: Never Used  Substance and Sexual Activity  . Alcohol use: Not Currently    Comment: none in many years  . Drug use: Never  . Sexual activity: Not on file  Other Topics Concern  . Not on file  Social History Narrative  . Not on file   Social Determinants of Health   Financial Resource Strain:   . Difficulty of Paying Living Expenses: Not on file  Food Insecurity:   . Worried About Charity fundraiser in the Last Year: Not on file  . Ran Out of Food in the Last Year: Not on file  Transportation Needs:   . Lack of Transportation (Medical): Not on file  . Lack of Transportation (Non-Medical): Not on file  Physical Activity:   . Days of Exercise per  Week: Not on file  . Minutes of Exercise per Session: Not on file  Stress:   . Feeling of Stress : Not on file  Social Connections:   . Frequency of Communication with Friends and Family: Not on file  . Frequency of Social Gatherings with Friends and Family: Not on file  . Attends Religious Services: Not on file  . Active Member of Clubs or Organizations: Not on file  . Attends Archivist Meetings: Not on file  . Marital Status: Not on file  Intimate Partner Violence:   . Fear of Current or Ex-Partner: Not on file  . Emotionally Abused: Not on file  . Physically Abused: Not on file  . Sexually Abused: Not on file  Review of Systems:  All other review of systems negative except as mentioned in the HPI.  Physical Exam: Vital signs in last 24 hours: Temp:  [97.8 F (36.6 C)] 97.8 F (36.6 C) (02/03 1107) Pulse Rate:  [51] 51 (02/03 1107) Resp:  [13] 13 (02/03 1107) BP: (103)/(46) 103/46 (02/03 1107) SpO2:  [93 %] 93 % (02/03 1107) Weight:  [55.8 kg] 55.8 kg (02/03 1107)   General:   Alert,  in NAD Lungs:  Clear throughout to auscultation.   Heart:  Regular rate and rhythm; no murmurs, clicks, rubs,  or gallops. Abdomen:  Soft, nontender and nondistended. Normal bowel sounds.   Neuro/Psych:  Alert and cooperative. Normal mood and affect. A and O x 3   K. Denzil Magnuson , MD [phone]

## 2019-09-02 NOTE — Transfer of Care (Signed)
Immediate Anesthesia Transfer of Care Note  Patient: Robyn Ashley  Procedure(s) Performed: Procedure(s) with comments: ESOPHAGOGASTRODUODENOSCOPY (EGD) WITH PROPOFOL (N/A) - with savory dilation no fluro/mano probe placement  Patient Location: PACU  Anesthesia Type:General  Level of Consciousness: Alert, Awake, Oriented  Airway & Oxygen Therapy: Patient Spontanous Breathing  Post-op Assessment: Report given to RN  Post vital signs: Reviewed and stable  Last Vitals:  Vitals:   09/02/19 1107  BP: (!) 103/46  Pulse: (!) 51  Resp: 13  Temp: 36.6 C  SpO2: [id number]    Complications: No apparent anesthesia complications

## 2019-09-02 NOTE — Progress Notes (Signed)
Esophageal manometry performed per protocol.  Probe place during EGD by Dr. Harl Bowie.  Probe pulled back to 48 cm at left nare.  Patient tolerated well.  Report sent to Dr. Silverio Decamp.

## 2019-09-02 NOTE — Anesthesia Preprocedure Evaluation (Addendum)
Anesthesia Evaluation  Patient identified by MRN, date of birth, ID band Patient awake    Reviewed: Allergy & Precautions, NPO status , Patient's Chart, lab work & pertinent test results, reviewed documented beta blocker date and time   Airway Mallampati: II  TM Distance: >3 FB Neck ROM: Full    Dental no notable dental hx. (+) Chipped, Dental Advisory Given,    Pulmonary sleep apnea , former smoker,    Pulmonary exam normal breath sounds clear to auscultation       Cardiovascular hypertension, Pt. on home beta blockers and Pt. on medications Normal cardiovascular exam Rhythm:Regular Rate:Normal     Neuro/Psych negative neurological ROS  negative psych ROS   GI/Hepatic Neg liver ROS, hiatal hernia, GERD  Medicated,  Endo/Other  Hypothyroidism   Renal/GU negative Renal ROS  negative genitourinary   Musculoskeletal negative musculoskeletal ROS (+)   Abdominal   Peds  Hematology negative hematology ROS (+)   Anesthesia Other Findings   Reproductive/Obstetrics                             Anesthesia Physical  Anesthesia Plan  ASA: III  Anesthesia Plan: MAC   Post-op Pain Management:    Induction: Intravenous  PONV Risk Score and Plan: 3  Airway Management Planned: Nasal Cannula, Simple Face Mask and Mask  Additional Equipment:   Intra-op Plan:   Post-operative Plan: Extubation in OR  Informed Consent: I have reviewed the patients History and Physical, chart, labs and discussed the procedure including the risks, benefits and alternatives for the proposed anesthesia with the patient or authorized representative who has indicated his/her understanding and acceptance.     Dental advisory given  Plan Discussed with: CRNA and Anesthesiologist  Anesthesia Plan Comments:        Anesthesia Quick Evaluation

## 2019-09-02 NOTE — Op Note (Signed)
[facility] Patient Name: Robyn Ashley Procedure Date: 09/02/2019 MRN: [id number] Attending MD: Mauri Pole , MD Date of Birth: 1951-01-27 CSN: [id number] Age: 69 Admit Type: Outpatient Procedure:                Upper GI endoscopy Indications:              Dysphagia Providers:                Mauri Pole, MD, Benay Pillow, RN, Marguerita Merles, Technician Referring MD:              Medicines:                Propofol per Anesthesia Complications:            No immediate complications. Estimated Blood Loss:     Estimated blood loss was minimal. Procedure:                Pre-Anesthesia Assessment:                           - Prior to the procedure, a History and Physical                            was performed, and patient medications and                            allergies were reviewed. The patient's tolerance of                            previous anesthesia was also reviewed. The risks                            and benefits of the procedure and the sedation                            options and risks were discussed with the patient.                            All questions were answered, and informed consent                            was obtained. Prior Anticoagulants: The patient has                            taken no previous anticoagulant or antiplatelet                            agents. ASA Grade Assessment: II - A patient with                            mild systemic disease. After reviewing the risks  and benefits, the patient was deemed in                            satisfactory condition to undergo the procedure.                           After obtaining informed consent, the endoscope was                            passed under direct vision. Throughout the                            procedure, the patient's blood pressure, pulse, and                            oxygen saturations were  monitored continuously. The                            [id number] Olympus gastroscope was                            introduced through the mouth, and advanced to the                            second part of duodenum. The upper GI endoscopy was                            accomplished without difficulty. The patient                            tolerated the procedure well. Scope In: Scope Out: Findings:      The examined duodenum was normal.      The stomach was normal.      The lumen of the lower third of the esophagus was mildly dilated.      The Z-line was regular and was found 35 cm from the incisors.      Evidence of a Nissen fundoplication was found in the cardia. The wrap       appeared intact. This was traversed. A TTS dilator was passed through       the scope. Dilation with an 18-19-20 mm x 8 cm CRE balloon dilator was       performed to 20 mm. The dilation site was examined following endoscope       reinsertion and showed mild mucosal disruption. A guide wire was       inserted into the stomach and the endoscope was removed. A esophageal       manometry catheter was advanced into the stomach. Placement was       confirmed by scope visualization. Impression:               - Normal examined duodenum.                           - Normal stomach.                           -  Dilation in the lower third of the esophagus.                           - Z-line regular, 35 cm from the incisors.                           - A Nissen fundoplication was found. The wrap                            appears intact. Dilated.                           - A water perfusion esophageal manometry catheter                            was placed into the stomach.                           - No specimens collected. Moderate Sedation:      Not Applicable - Patient had care per Anesthesia. Recommendation:           - Resume previous diet.                           - Continue present medications.                            - Return to my office at the next available                            appointment. Procedure Code(s):        --- Professional ---                           [phone], Esophagogastroduodenoscopy, flexible,                            transoral; with dilation of gastric/duodenal                            stricture(s) (eg, balloon, bougie)                           43241, Esophagogastroduodenoscopy, flexible,                            transoral; with insertion of intraluminal tube or                            catheter Diagnosis Code(s):        --- Professional ---                           K22.8, Other specified diseases of esophagus                           Z98.890, Other specified postprocedural states  R13.10, Dysphagia, unspecified CPT copyright 2019 American Medical Association. All rights reserved. The codes documented in this report are preliminary and upon coder review may  be revised to meet current compliance requirements. Mauri Pole, MD 09/02/2019 12:25:25 PM This report has been signed electronically. Number of Addenda: 0

## 2019-09-02 NOTE — Discharge Instructions (Signed)
YOU HAD AN ENDOSCOPIC PROCEDURE TODAY: Refer to the procedure report and other information in the discharge instructions given to you for any specific questions about what was found during the examination. If this information does not answer your questions, please call [place] office at [phone] to clarify.   YOU SHOULD EXPECT: Some feelings of bloating in the abdomen. Passage of more gas than usual. Walking can help get rid of the air that was put into your GI tract during the procedure and reduce the bloating. If you had a lower endoscopy (such as a colonoscopy or flexible sigmoidoscopy) you may notice spotting of blood in your stool or on the toilet paper. Some abdominal soreness may be present for a day or two, also.  DIET: Soft foods for the rest of today.  Normal diet tomorrow.   ACTIVITY: Your care partner should take you home directly after the procedure. You should plan to take it easy, moving slowly for the rest of the day. You can resume normal activity the day after the procedure however YOU SHOULD NOT DRIVE, use power tools, machinery or perform tasks that involve climbing or major physical exertion for 24 hours (because of the sedation medicines used during the test).   SYMPTOMS TO REPORT IMMEDIATELY: A gastroenterologist can be reached at any hour. Please call [phone]  for any of the following symptoms:   Following upper endoscopy (EGD, EUS, ERCP, esophageal dilation) Vomiting of blood or coffee ground material  New, significant abdominal pain  New, significant chest pain or pain under the shoulder blades  Painful or persistently difficult swallowing  New shortness of breath  Black, tarry-looking or red, bloody stools  FOLLOW UP:  If any biopsies were taken you will be contacted by phone or by letter within the next 1-3 weeks. Call [phone]  if you have not heard about the biopsies in 3 weeks.  Please also call with any specific questions about appointments or follow  up tests.

## 2019-09-02 NOTE — Anesthesia Postprocedure Evaluation (Signed)
Anesthesia Post Note  Patient: Robyn Ashley  Procedure(s) Performed: ESOPHAGOGASTRODUODENOSCOPY (EGD) WITH PROPOFOL (N/A )     Patient location during evaluation: Endoscopy Anesthesia Type: MAC Level of consciousness: awake and alert Pain management: pain level controlled Vital Signs Assessment: post-procedure vital signs reviewed and stable Respiratory status: spontaneous breathing, nonlabored ventilation and respiratory function stable Cardiovascular status: stable and blood pressure returned to baseline Postop Assessment: no apparent nausea or vomiting Anesthetic complications: no    Last Vitals:  Vitals:   09/02/19 1230 09/02/19 1235  BP: 128/63 133/68  Pulse: 62 62  Resp: 14 13  Temp:    SpO2: 99% 97%    Last Pain:  Vitals:   09/02/19 1220  TempSrc: Oral  PainSc: 0-No pain                 Catalina Gravel

## 2019-09-03 ENCOUNTER — Encounter: Payer: Self-pay | Admitting: *Deleted

## 2019-09-11 ENCOUNTER — Telehealth: Payer: Self-pay | Admitting: Gastroenterology

## 2019-09-11 NOTE — Telephone Encounter (Signed)
Pt called inquiring about her manometry results. She states that her swallowing issues are worse. Pls call her.

## 2019-09-11 NOTE — Telephone Encounter (Signed)
Spoke with the patient. She is inquiring about the results from her manometry study. States she is having a hard time keeping the protein shakes down. "Sometimes they come back up."

## 2019-09-12 DIAGNOSIS — Z23 Encounter for immunization: Secondary | ICD-10-CM | POA: Diagnosis not present

## 2019-09-15 NOTE — Telephone Encounter (Signed)
Thanks for the follow-up.  Your plan of management sounds good.

## 2019-09-15 NOTE — Telephone Encounter (Signed)
Beth, please advise patient to call Dr. Ron Parker office to discuss options for possible partial takedown of Nissen.  Thank you

## 2019-09-15 NOTE — Telephone Encounter (Signed)
I have given this information to the patient. She is waiting for recommendations from her primary GI.

## 2019-09-15 NOTE — Telephone Encounter (Signed)
There is persistent high pressure at EG junction causing EG outflow obstruction otherwise no other specific peristaltic abnormality (fulreport will be scanned into epic).  I did the 20 mm balloon dilation and appears she has no improvement.  Pneumatic dilation may not improve and potentially cause recurrence of gastroesophageal reflux, worse than baseline in some cases.  She may benefit from partial takedown of Nissen. I will Cc Dr Kae Heller.  Thanks VN

## 2019-09-15 NOTE — Telephone Encounter (Signed)
Called the patient. No answer. Left a message with this information. Left my name and phone number for her to call with any questions.

## 2019-09-17 ENCOUNTER — Ambulatory Visit: Payer: Medicare Other

## 2019-09-17 ENCOUNTER — Other Ambulatory Visit: Payer: Medicare Other

## 2019-09-21 ENCOUNTER — Ambulatory Visit: Payer: Self-pay | Admitting: Surgery

## 2019-09-22 NOTE — Patient Instructions (Addendum)
DUE TO COVID-19 ONLY ONE VISITOR IS ALLOWED TO COME WITH YOU AND STAY IN THE WAITING ROOM ONLY DURING PRE OP AND PROCEDURE DAY OF SURGERY. THE 1 VISITOR MAY VISIT WITH YOU AFTER SURGERY IN YOUR PRIVATE ROOM DURING VISITING HOURS ONLY!  YOU NEED TO HAVE A COVID 19 TEST ON_2/26______ @__10 :00_____, THIS TEST MUST BE DONE BEFORE SURGERY,   ONCE YOUR COVID TEST IS COMPLETED, PLEASE BEGIN THE QUARANTINE INSTRUCTIONS AS OUTLINED IN YOUR HANDOUT.                VANNETTE HELMICK    Your procedure is scheduled on: 09/29/19   Report to [facility] Main  Entrance   Report to admitting at 11:00 AM     Call this number if you have problems the morning of surgery [phone]   Follow any bowel prep instructions from the Dr.   Remember: Do not eat food or drink liquids :After Midnight.   BRUSH YOUR TEETH MORNING OF SURGERY AND RINSE YOUR MOUTH OUT, NO CHEWING GUM CANDY OR MINTS.     Take these medicines the morning of surgery with A SIP OF WATER: Prozac,Levothyroxine  Bring your  Mask and tubing to the hospital.                                You may not have any metal on your body including hair pins and              piercings  Do not wear jewelry, make-up, lotions, powders or perfumes, deodorant             Do not wear nail polish on your fingernails.  Do not shave  48 hours prior to surgery.                Do not bring valuables to the hospital. [place].  Contacts, dentures or bridgework may not be worn into surgery.       Special Instructions: N/A              Please read over the following fact sheets you were given: _____________________________________________________________________             [facility] - Preparing for Surgery  Before surgery, you can play an important role.   Because skin is not sterile, your skin needs to be as free of germs as possible.    You can reduce the number of germs on your skin by  washing with CHG (chlorahexidine gluconate) soap before surgery.  CHG is an antiseptic cleaner which kills germs and bonds with the skin to continue killing germs even after washing. Please DO NOT use if you have an allergy to CHG or antibacterial soaps.   If your skin becomes reddened/irritated stop using the CHG and inform your nurse when you arrive at Short Stay. Do not shave (including legs and underarms) for at least 48 hours prior to the first CHG shower.   . Please follow these instructions carefully:  1.  Shower with CHG Soap the night before surgery and the  morning of Surgery.  2.  If you choose to wash your hair, wash your hair first as usual with your  normal  shampoo.  3.  After you shampoo, rinse your hair and body thoroughly to remove the  shampoo.                                        4.  Use CHG as you would any other liquid soap.  You can apply chg directly  to the skin and wash                       Gently with a scrungie or clean washcloth.  5.  Apply the CHG Soap to your body ONLY FROM THE NECK DOWN.   Do not use on face/ open                           Wound or open sores. Avoid contact with eyes, ears mouth and genitals (private parts).                       Wash face,  Genitals (private parts) with your normal soap.             6.  Wash thoroughly, paying special attention to the area where your surgery  will be performed.  7.  Thoroughly rinse your body with warm water from the neck down.  8.  DO NOT shower/wash with your normal soap after using and rinsing off  the CHG Soap.             9.  Pat yourself dry with a clean towel.            10.  Wear clean pajamas.            11.  Place clean sheets on your bed the night of your first shower and do not  sleep with pets. Day of Surgery : Do not apply any lotions/deodorants the morning of surgery.  Please wear clean clothes to the hospital/surgery center.  FAILURE TO FOLLOW THESE INSTRUCTIONS MAY RESULT IN THE CANCELLATION  OF YOUR SURGERY PATIENT SIGNATURE_________________________________  NURSE SIGNATURE__________________________________  ________________________________________________________________________   Adam Phenix  An incentive spirometer is a tool that can help keep your lungs clear and active. This tool measures how well you are filling your lungs with each breath. Taking long deep breaths may help reverse or decrease the chance of developing breathing (pulmonary) problems (especially infection) following:  A long period of time when you are unable to move or be active. BEFORE THE PROCEDURE   If the spirometer includes an indicator to show your best effort, your nurse or respiratory therapist will set it to a desired goal.  If possible, sit up straight or lean slightly forward. Try not to slouch.  Hold the incentive spirometer in an upright position. INSTRUCTIONS FOR USE  1. Sit on the edge of your bed if possible, or sit up as far as you can in bed or on a chair. 2. Hold the incentive spirometer in an upright position. 3. Breathe out normally. 4. Place the mouthpiece in your mouth and seal your lips tightly around it. 5. Breathe in slowly and as deeply as possible, raising the piston or the ball toward the top of the column. 6. Hold your breath for 3-5 seconds or for as long as possible. Allow the piston or ball to fall to the bottom of the column. 7. Remove the mouthpiece from your mouth and breathe out normally. 8. Rest for a few seconds and  repeat Steps 1 through 7 at least 10 times every 1-2 hours when you are awake. Take your time and take a few normal breaths between deep breaths. 9. The spirometer may include an indicator to show your best effort. Use the indicator as a goal to work toward during each repetition. 10. After each set of 10 deep breaths, practice coughing to be sure your lungs are clear. If you have an incision (the cut made at the time of surgery), support your  incision when coughing by placing a pillow or rolled up towels firmly against it. Once you are able to get out of bed, walk around indoors and cough well. You may stop using the incentive spirometer when instructed by your caregiver.  RISKS AND COMPLICATIONS  Take your time so you do not get dizzy or light-headed.  If you are in pain, you may need to take or ask for pain medication before doing incentive spirometry. It is harder to take a deep breath if you are having pain. AFTER USE  Rest and breathe slowly and easily.  It can be helpful to keep track of a log of your progress. Your caregiver can provide you with a simple table to help with this. If you are using the spirometer at home, follow these instructions: [place] IF:   You are having difficultly using the spirometer.  You have trouble using the spirometer as often as instructed.  Your pain medication is not giving enough relief while using the spirometer.  You develop fever of 100.5 F (38.1 C) or higher. SEEK IMMEDIATE MEDICAL CARE IF:   You cough up bloody sputum that had not been present before.  You develop fever of 102 F (38.9 C) or greater.  You develop worsening pain at or near the incision site. MAKE SURE YOU:   Understand these instructions.  Will watch your condition.  Will get help right away if you are not doing well or get worse. Document Released: 11/26/2006 Document Revised: 10/08/2011 Document Reviewed: 01/27/2007 [facility] Patient Information 2014 [place], [place].   ________________________________________________________________________

## 2019-09-24 ENCOUNTER — Encounter (HOSPITAL_COMMUNITY)
Admission: RE | Admit: 2019-09-24 | Discharge: 2019-09-24 | Disposition: A | Discharge status: Home / Self Care | Payer: Medicare Other | Source: Ambulatory Visit | Attending: Surgery | Admitting: Surgery

## 2019-09-24 ENCOUNTER — Encounter (HOSPITAL_COMMUNITY): Payer: Self-pay

## 2019-09-24 ENCOUNTER — Other Ambulatory Visit: Payer: Self-pay

## 2019-09-24 DIAGNOSIS — Z87891 Personal history of nicotine dependence: Secondary | ICD-10-CM | POA: Diagnosis not present

## 2019-09-24 DIAGNOSIS — R1310 Dysphagia, unspecified: Secondary | ICD-10-CM | POA: Diagnosis not present

## 2019-09-24 DIAGNOSIS — E039 Hypothyroidism, unspecified: Secondary | ICD-10-CM | POA: Diagnosis not present

## 2019-09-24 DIAGNOSIS — I10 Essential (primary) hypertension: Secondary | ICD-10-CM | POA: Diagnosis not present

## 2019-09-24 DIAGNOSIS — Z79899 Other long term (current) drug therapy: Secondary | ICD-10-CM | POA: Insufficient documentation

## 2019-09-24 DIAGNOSIS — Z01812 Encounter for preprocedural laboratory examination: Secondary | ICD-10-CM | POA: Diagnosis not present

## 2019-09-24 DIAGNOSIS — Z79890 Hormone replacement therapy (postmenopausal): Secondary | ICD-10-CM | POA: Insufficient documentation

## 2019-09-24 DIAGNOSIS — G4733 Obstructive sleep apnea (adult) (pediatric): Secondary | ICD-10-CM | POA: Insufficient documentation

## 2019-09-24 HISTORY — DX: Hypothyroidism, unspecified: E03.9

## 2019-09-24 LAB — CBC WITH DIFFERENTIAL/PLATELET
Abs Immature Granulocytes: 0.02 10*3/uL (ref 0.00–0.07)
Basophils Absolute: 0.1 10*3/uL (ref 0.0–0.1)
Basophils Relative: 1 %
Eosinophils Absolute: 0.5 10*3/uL (ref 0.0–0.5)
Eosinophils Relative: 6 %
HCT: 37.3 % (ref 36.0–46.0)
Hemoglobin: 12.1 g/dL (ref 12.0–15.0)
Immature Granulocytes: 0 %
Lymphocytes Relative: 25 %
Lymphs Abs: 2 10*3/uL (ref 0.7–4.0)
MCH: 29.5 pg (ref 26.0–34.0)
MCHC: 32.4 g/dL (ref 30.0–36.0)
MCV: 91 fL (ref 80.0–100.0)
Monocytes Absolute: 0.8 10*3/uL (ref 0.1–1.0)
Monocytes Relative: 10 %
Neutro Abs: 4.7 10*3/uL (ref 1.7–7.7)
Neutrophils Relative %: 58 %
Platelets: 375 10*3/uL (ref 150–400)
RBC: 4.1 MIL/uL (ref 3.87–5.11)
RDW: 12.6 % (ref 11.5–15.5)
WBC: 7.9 10*3/uL (ref 4.0–10.5)
nRBC: 0 % (ref 0.0–0.2)

## 2019-09-24 LAB — COMPREHENSIVE METABOLIC PANEL
ALT: 15 U/L (ref 0–44)
AST: 25 U/L (ref 15–41)
Albumin: 4.2 g/dL (ref 3.5–5.0)
Alkaline Phosphatase: 110 U/L (ref 38–126)
Anion gap: 9 (ref 5–15)
BUN: 14 mg/dL (ref 8–23)
CO2: 30 mmol/L (ref 22–32)
Calcium: 9.1 mg/dL (ref 8.9–10.3)
Chloride: 101 mmol/L (ref 98–111)
Creatinine, Ser: 0.71 mg/dL (ref 0.44–1.00)
GFR calc Af Amer: >60 mL/min (ref >60)
GFR calc non Af Amer: >60 mL/min (ref >60)
Glucose, Bld: 95 mg/dL (ref 70–99)
Potassium: 4 mmol/L (ref 3.5–5.1)
Sodium: 140 mmol/L (ref 135–145)
Total Bilirubin: 0.7 mg/dL (ref 0.3–1.2)
Total Protein: 7.3 g/dL (ref 6.5–8.1)

## 2019-09-24 NOTE — Progress Notes (Signed)
PCP - Dr. Selena Batten Cardiologist - Dr. Wayland Salinas  Chest x-ray - 01/31/19 EKG - 01/29/19 Stress Test - no ECHO - 2016 Cardiac Cath -no   Sleep Study - yes CPAP - yes  Fasting Blood Sugar - NA Checks Blood Sugar _____ times a day  Blood Thinner Instructions:NA Aspirin Instructions: Last Dose:  Anesthesia review:   Patient denies shortness of breath, fever, cough and chest pain at PAT appointment yes  Patient verbalized understanding of instructions that were given to them at the PAT appointment. Patient was also instructed that they will need to review over the PAT instructions again at home before surgery. Yes  Pt had card work up for SVT 2016. No problems since.Narcolepsy is well controlled. Pt has full ROM and can open mouth wide TMJ is not a problem

## 2019-09-25 ENCOUNTER — Other Ambulatory Visit (HOSPITAL_COMMUNITY)
Admission: RE | Admit: 2019-09-25 | Discharge: 2019-09-25 | Disposition: A | Discharge status: Home / Self Care | Payer: Medicare Other | Source: Ambulatory Visit | Attending: Surgery | Admitting: Surgery

## 2019-09-25 DIAGNOSIS — Z20822 Contact with and (suspected) exposure to covid-19: Secondary | ICD-10-CM | POA: Insufficient documentation

## 2019-09-25 DIAGNOSIS — Z01812 Encounter for preprocedural laboratory examination: Secondary | ICD-10-CM | POA: Diagnosis not present

## 2019-09-25 LAB — SARS CORONAVIRUS 2 (TAT 6-24 HRS): SARS Coronavirus 2: NEGATIVE

## 2019-09-28 HISTORY — PX: NISSEN FUNDOPLICATION: SHX2091

## 2019-09-28 MED ORDER — BUPIVACAINE LIPOSOME 1.3 % IJ SUSP
20.0000 mL | Freq: Once | INTRAMUSCULAR | Status: DC
  Filled 2019-09-28: qty 20

## 2019-09-29 ENCOUNTER — Inpatient Hospital Stay (HOSPITAL_COMMUNITY): Payer: Medicare Other | Admitting: Physician Assistant

## 2019-09-29 ENCOUNTER — Inpatient Hospital Stay (HOSPITAL_COMMUNITY): Payer: Medicare Other | Admitting: Certified Registered Nurse Anesthetist

## 2019-09-29 ENCOUNTER — Other Ambulatory Visit: Payer: Self-pay

## 2019-09-29 ENCOUNTER — Inpatient Hospital Stay (HOSPITAL_COMMUNITY)
Admission: RE | Admit: 2019-09-29 | Discharge: 2019-10-01 | DRG: 326 | Disposition: A | Discharge status: Home / Self Care | Payer: Medicare Other | Attending: Surgery | Admitting: Surgery

## 2019-09-29 ENCOUNTER — Encounter (HOSPITAL_COMMUNITY): Payer: Self-pay | Admitting: Surgery

## 2019-09-29 ENCOUNTER — Encounter (HOSPITAL_COMMUNITY)
Admission: RE | Disposition: A | Discharge status: Home / Self Care | Payer: Self-pay | Source: Home / Self Care | Attending: Surgery

## 2019-09-29 DIAGNOSIS — Z79899 Other long term (current) drug therapy: Secondary | ICD-10-CM

## 2019-09-29 DIAGNOSIS — Z79890 Hormone replacement therapy (postmenopausal): Secondary | ICD-10-CM | POA: Diagnosis not present

## 2019-09-29 DIAGNOSIS — Z888 Allergy status to other drugs, medicaments and biological substances status: Secondary | ICD-10-CM

## 2019-09-29 DIAGNOSIS — Z90710 Acquired absence of both cervix and uterus: Secondary | ICD-10-CM | POA: Diagnosis not present

## 2019-09-29 DIAGNOSIS — G4733 Obstructive sleep apnea (adult) (pediatric): Secondary | ICD-10-CM | POA: Diagnosis present

## 2019-09-29 DIAGNOSIS — K219 Gastro-esophageal reflux disease without esophagitis: Secondary | ICD-10-CM | POA: Diagnosis present

## 2019-09-29 DIAGNOSIS — I10 Essential (primary) hypertension: Secondary | ICD-10-CM | POA: Diagnosis present

## 2019-09-29 DIAGNOSIS — Z6823 Body mass index (BMI) 23.0-23.9, adult: Secondary | ICD-10-CM | POA: Diagnosis not present

## 2019-09-29 DIAGNOSIS — E43 Unspecified severe protein-calorie malnutrition: Secondary | ICD-10-CM | POA: Diagnosis present

## 2019-09-29 DIAGNOSIS — E039 Hypothyroidism, unspecified: Secondary | ICD-10-CM | POA: Diagnosis present

## 2019-09-29 DIAGNOSIS — Z87891 Personal history of nicotine dependence: Secondary | ICD-10-CM | POA: Diagnosis not present

## 2019-09-29 DIAGNOSIS — K222 Esophageal obstruction: Secondary | ICD-10-CM | POA: Diagnosis not present

## 2019-09-29 DIAGNOSIS — R1310 Dysphagia, unspecified: Secondary | ICD-10-CM | POA: Diagnosis present

## 2019-09-29 DIAGNOSIS — I341 Nonrheumatic mitral (valve) prolapse: Secondary | ICD-10-CM | POA: Diagnosis present

## 2019-09-29 DIAGNOSIS — Z886 Allergy status to analgesic agent status: Secondary | ICD-10-CM

## 2019-09-29 DIAGNOSIS — Z800 Family history of malignant neoplasm of digestive organs: Secondary | ICD-10-CM

## 2019-09-29 DIAGNOSIS — K660 Peritoneal adhesions (postprocedural) (postinfection): Secondary | ICD-10-CM | POA: Diagnosis not present

## 2019-09-29 DIAGNOSIS — Z881 Allergy status to other antibiotic agents status: Secondary | ICD-10-CM

## 2019-09-29 DIAGNOSIS — J438 Other emphysema: Secondary | ICD-10-CM | POA: Diagnosis present

## 2019-09-29 SURGERY — FUNDOPLICATION, NISSEN, ROBOT-ASSISTED, LAPAROSCOPIC
Anesthesia: General | Site: Abdomen

## 2019-09-29 MED ORDER — BUPIVACAINE HCL (PF) 0.25 % IJ SOLN
INTRAMUSCULAR | Status: DC | PRN
  Administered 2019-09-29: 30 mL

## 2019-09-29 MED ORDER — ENALAPRIL MALEATE 10 MG PO TABS: 10.0000 mg | ORAL_TABLET | Freq: Every day | ORAL | Status: DC

## 2019-09-29 MED ORDER — BISACODYL 10 MG RE SUPP: 10.0000 mg | Freq: Every day | RECTAL | Status: DC | PRN

## 2019-09-29 MED ORDER — ONDANSETRON HCL 4 MG/2ML IJ SOLN
4.0000 mg | Freq: Four times a day (QID) | INTRAMUSCULAR | Status: DC | PRN
  Administered 2019-09-30 – 2019-10-01 (×2): 4 mg via INTRAVENOUS
  Filled 2019-09-29 (×2): qty 2

## 2019-09-29 MED ORDER — LIDOCAINE 2% (20 MG/ML) 5 ML SYRINGE
INTRAMUSCULAR | Status: DC | PRN
  Administered 2019-09-29: 60 mg via INTRAVENOUS

## 2019-09-29 MED ORDER — PROPOFOL 10 MG/ML IV BOLUS
INTRAVENOUS | Status: DC | PRN
  Administered 2019-09-29: 90 mg via INTRAVENOUS

## 2019-09-29 MED ORDER — EPHEDRINE SULFATE-NACL 50-0.9 MG/10ML-% IV SOSY
PREFILLED_SYRINGE | INTRAVENOUS | Status: DC | PRN
  Administered 2019-09-29 (×4): 10 mg via INTRAVENOUS
  Administered 2019-09-29: 15 mg via INTRAVENOUS

## 2019-09-29 MED ORDER — HYDROMORPHONE HCL 1 MG/ML IJ SOLN: 0.2500 mg | INTRAMUSCULAR | Status: DC | PRN

## 2019-09-29 MED ORDER — ACETAMINOPHEN 500 MG PO TABS
1000.0000 mg | ORAL_TABLET | Freq: Four times a day (QID) | ORAL | Status: DC
  Administered 2019-09-29 – 2019-10-01 (×4): 1000 mg via ORAL
  Filled 2019-09-29 (×7): qty 2

## 2019-09-29 MED ORDER — LACTATED RINGERS IV SOLN: INTRAVENOUS | Status: DC

## 2019-09-29 MED ORDER — MEPERIDINE HCL 50 MG/ML IJ SOLN: 6.2500 mg | INTRAMUSCULAR | Status: DC | PRN

## 2019-09-29 MED ORDER — CHLORHEXIDINE GLUCONATE 4 % EX LIQD: 60.0000 mL | Freq: Once | CUTANEOUS | Status: DC

## 2019-09-29 MED ORDER — SODIUM CHLORIDE 0.9 % IV SOLN: INTRAVENOUS | Status: DC

## 2019-09-29 MED ORDER — ENOXAPARIN SODIUM 40 MG/0.4ML ~~LOC~~ SOLN
40.0000 mg | SUBCUTANEOUS | Status: DC
  Administered 2019-09-30 – 2019-10-01 (×2): 40 mg via SUBCUTANEOUS
  Filled 2019-09-29 (×2): qty 0.4

## 2019-09-29 MED ORDER — FLUOXETINE HCL 10 MG PO CAPS
10.0000 mg | ORAL_CAPSULE | Freq: Every day | ORAL | Status: DC
  Administered 2019-09-30 – 2019-10-01 (×2): 10 mg via ORAL
  Filled 2019-09-29 (×2): qty 1

## 2019-09-29 MED ORDER — ONDANSETRON 4 MG PO TBDP: 4.0000 mg | ORAL_TABLET | Freq: Four times a day (QID) | ORAL | Status: DC | PRN

## 2019-09-29 MED ORDER — BOOST / RESOURCE BREEZE PO LIQD CUSTOM
1.0000 | Freq: Three times a day (TID) | ORAL | Status: DC
  Administered 2019-09-29 – 2019-10-01 (×5): 1 via ORAL

## 2019-09-29 MED ORDER — HYDROCHLOROTHIAZIDE 25 MG PO TABS: 25.0000 mg | ORAL_TABLET | Freq: Every day | ORAL | Status: DC

## 2019-09-29 MED ORDER — PANTOPRAZOLE SODIUM 40 MG IV SOLR
40.0000 mg | Freq: Every day | INTRAVENOUS | Status: DC
  Administered 2019-09-29 – 2019-09-30 (×2): 40 mg via INTRAVENOUS
  Filled 2019-09-29 (×2): qty 40

## 2019-09-29 MED ORDER — ONDANSETRON HCL 4 MG/2ML IJ SOLN
INTRAMUSCULAR | Status: AC
  Filled 2019-09-29: qty 2

## 2019-09-29 MED ORDER — PHENYLEPHRINE 40 MCG/ML (10ML) SYRINGE FOR IV PUSH (FOR BLOOD PRESSURE SUPPORT)
PREFILLED_SYRINGE | INTRAVENOUS | Status: DC | PRN
  Administered 2019-09-29 (×4): 80 ug via INTRAVENOUS

## 2019-09-29 MED ORDER — CEFAZOLIN SODIUM-DEXTROSE 2-4 GM/100ML-% IV SOLN
2.0000 g | INTRAVENOUS | Status: AC
  Administered 2019-09-29: 2 g via INTRAVENOUS
  Filled 2019-09-29: qty 100

## 2019-09-29 MED ORDER — MIDAZOLAM HCL 5 MG/5ML IJ SOLN
INTRAMUSCULAR | Status: DC | PRN
  Administered 2019-09-29: 2 mg via INTRAVENOUS

## 2019-09-29 MED ORDER — 0.9 % SODIUM CHLORIDE (POUR BTL) OPTIME
TOPICAL | Status: DC | PRN
  Administered 2019-09-29: 1000 mL

## 2019-09-29 MED ORDER — DIPHENHYDRAMINE HCL 50 MG/ML IJ SOLN: 12.5000 mg | Freq: Four times a day (QID) | INTRAMUSCULAR | Status: DC | PRN

## 2019-09-29 MED ORDER — LEVOTHYROXINE SODIUM 50 MCG PO TABS
50.0000 ug | ORAL_TABLET | Freq: Every day | ORAL | Status: DC
  Administered 2019-09-30 – 2019-10-01 (×2): 50 ug via ORAL
  Filled 2019-09-29 (×2): qty 1

## 2019-09-29 MED ORDER — LACTATED RINGERS IV SOLN: INTRAVENOUS | Status: DC | PRN

## 2019-09-29 MED ORDER — ONDANSETRON HCL 4 MG/2ML IJ SOLN: 4.0000 mg | Freq: Once | INTRAMUSCULAR | Status: DC | PRN

## 2019-09-29 MED ORDER — ACETAMINOPHEN 500 MG PO TABS
1000.0000 mg | ORAL_TABLET | ORAL | Status: AC
  Administered 2019-09-29: 1000 mg via ORAL
  Filled 2019-09-29: qty 2

## 2019-09-29 MED ORDER — TRAMADOL HCL 50 MG PO TABS
50.0000 mg | ORAL_TABLET | Freq: Four times a day (QID) | ORAL | Status: DC | PRN
  Administered 2019-09-30: 50 mg via ORAL
  Filled 2019-09-29: qty 1

## 2019-09-29 MED ORDER — DOCUSATE SODIUM 100 MG PO CAPS
100.0000 mg | ORAL_CAPSULE | Freq: Two times a day (BID) | ORAL | Status: DC
  Administered 2019-09-29 – 2019-10-01 (×4): 100 mg via ORAL
  Filled 2019-09-29 (×4): qty 1

## 2019-09-29 MED ORDER — GABAPENTIN 300 MG PO CAPS
300.0000 mg | ORAL_CAPSULE | Freq: Two times a day (BID) | ORAL | Status: DC
  Administered 2019-09-29 – 2019-10-01 (×4): 300 mg via ORAL
  Filled 2019-09-29 (×4): qty 1

## 2019-09-29 MED ORDER — MIDAZOLAM HCL 2 MG/2ML IJ SOLN
INTRAMUSCULAR | Status: AC
  Filled 2019-09-29: qty 2

## 2019-09-29 MED ORDER — SUGAMMADEX SODIUM 200 MG/2ML IV SOLN
INTRAVENOUS | Status: DC | PRN
  Administered 2019-09-29: 150 mg via INTRAVENOUS

## 2019-09-29 MED ORDER — HYDROMORPHONE HCL 1 MG/ML IJ SOLN: 0.5000 mg | INTRAMUSCULAR | Status: DC | PRN

## 2019-09-29 MED ORDER — METOPROLOL SUCCINATE ER 50 MG PO TB24
50.0000 mg | ORAL_TABLET | Freq: Every day | ORAL | Status: DC
  Administered 2019-09-29 – 2019-09-30 (×2): 50 mg via ORAL
  Filled 2019-09-29 (×2): qty 1

## 2019-09-29 MED ORDER — FENTANYL CITRATE (PF) 250 MCG/5ML IJ SOLN
INTRAMUSCULAR | Status: AC
  Filled 2019-09-29: qty 5

## 2019-09-29 MED ORDER — DIPHENHYDRAMINE HCL 12.5 MG/5ML PO ELIX: 12.5000 mg | ORAL_SOLUTION | Freq: Four times a day (QID) | ORAL | Status: DC | PRN

## 2019-09-29 MED ORDER — BUPIVACAINE LIPOSOME 1.3 % IJ SUSP
INTRAMUSCULAR | Status: DC | PRN
  Administered 2019-09-29: 20 mL

## 2019-09-29 MED ORDER — EPHEDRINE 5 MG/ML INJ
INTRAVENOUS | Status: AC
  Filled 2019-09-29: qty 20

## 2019-09-29 MED ORDER — PHENYLEPHRINE 40 MCG/ML (10ML) SYRINGE FOR IV PUSH (FOR BLOOD PRESSURE SUPPORT)
PREFILLED_SYRINGE | INTRAVENOUS | Status: AC
  Filled 2019-09-29: qty 20

## 2019-09-29 MED ORDER — FENTANYL CITRATE (PF) 100 MCG/2ML IJ SOLN
INTRAMUSCULAR | Status: DC | PRN
  Administered 2019-09-29 (×5): 50 ug via INTRAVENOUS

## 2019-09-29 MED ORDER — HYDRALAZINE HCL 20 MG/ML IJ SOLN: 10.0000 mg | INTRAMUSCULAR | Status: DC | PRN

## 2019-09-29 MED ORDER — SIMETHICONE 80 MG PO CHEW: 40.0000 mg | CHEWABLE_TABLET | Freq: Four times a day (QID) | ORAL | Status: DC | PRN

## 2019-09-29 MED ORDER — PROPOFOL 10 MG/ML IV BOLUS
INTRAVENOUS | Status: AC
  Filled 2019-09-29: qty 20

## 2019-09-29 MED ORDER — ROCURONIUM BROMIDE 10 MG/ML (PF) SYRINGE
PREFILLED_SYRINGE | INTRAVENOUS | Status: DC | PRN
  Administered 2019-09-29: 90 mg via INTRAVENOUS

## 2019-09-29 MED ORDER — METHYLPHENIDATE HCL 20 MG PO TABS
20.0000 mg | ORAL_TABLET | ORAL | Status: DC
  Administered 2019-09-30 – 2019-10-01 (×6): 20 mg via ORAL
  Filled 2019-09-29 (×6): qty 2

## 2019-09-29 MED ORDER — ENALAPRIL-HYDROCHLOROTHIAZIDE 10-25 MG PO TABS: 1.0000 | ORAL_TABLET | Freq: Every day | ORAL | Status: DC

## 2019-09-29 MED ORDER — BUPIVACAINE HCL (PF) 0.25 % IJ SOLN
INTRAMUSCULAR | Status: AC
  Filled 2019-09-29: qty 30

## 2019-09-29 MED ORDER — METHOCARBAMOL 500 MG PO TABS: 500.0000 mg | ORAL_TABLET | Freq: Four times a day (QID) | ORAL | Status: DC | PRN

## 2019-09-29 SURGICAL SUPPLY — 74 items
ADH SKN CLS APL DERMABOND .7 (GAUZE/BANDAGES/DRESSINGS) ×2
APL PRP STRL LF DISP 70% ISPRP (MISCELLANEOUS) ×2
APPLIER CLIP 5 13 M/L LIGAMAX5 (MISCELLANEOUS)
APPLIER CLIP ROT 10 11.4 M/L (STAPLE)
APR CLP MED LRG 11.4X10 (STAPLE)
APR CLP MED LRG 5 ANG JAW (MISCELLANEOUS)
BLADE SURG SZ11 CARB STEEL (BLADE) ×3 IMPLANT
CHLORAPREP W/TINT 26 (MISCELLANEOUS) ×3 IMPLANT
CLIP APPLIE 5 13 M/L LIGAMAX5 (MISCELLANEOUS) IMPLANT
CLIP APPLIE ROT 10 11.4 M/L (STAPLE) IMPLANT
COVER SURGICAL LIGHT HANDLE (MISCELLANEOUS) ×3 IMPLANT
COVER TIP SHEARS 8 DVNC (MISCELLANEOUS) IMPLANT
COVER TIP SHEARS 8MM DA VINCI (MISCELLANEOUS) ×3
COVER WAND RF STERILE (DRAPES) ×3 IMPLANT
DECANTER SPIKE VIAL GLASS SM (MISCELLANEOUS) ×3 IMPLANT
DERMABOND ADVANCED (GAUZE/BANDAGES/DRESSINGS) ×1
DERMABOND ADVANCED .7 DNX12 (GAUZE/BANDAGES/DRESSINGS) ×2 IMPLANT
DRAIN PENROSE 0.5X18 (DRAIN) ×1 IMPLANT
DRAPE ARM DVNC X/XI (DISPOSABLE) ×8 IMPLANT
DRAPE CARDIOVASC SPLIT 84X147 (DRAPES) ×1 IMPLANT
DRAPE CARDIOVASC SPLIT 88X140 (DRAPES) ×1 IMPLANT
DRAPE COLUMN DVNC XI (DISPOSABLE) ×2 IMPLANT
DRAPE DA VINCI XI ARM (DISPOSABLE) ×12
DRAPE DA VINCI XI COLUMN (DISPOSABLE) ×3
DRAPE SHEET LG 3/4 BI-LAMINATE (DRAPES) ×1 IMPLANT
ELECT REM PT RETURN 15FT ADLT (MISCELLANEOUS) ×3 IMPLANT
ENDOLOOP SUT PDS II  0 18 (SUTURE)
ENDOLOOP SUT PDS II 0 18 (SUTURE) IMPLANT
GAUZE 4X4 16PLY RFD (DISPOSABLE) ×3 IMPLANT
GLOVE BIO SURGEON STRL SZ 6 (GLOVE) ×9 IMPLANT
GLOVE INDICATOR 6.5 STRL GRN (GLOVE) ×9 IMPLANT
GOWN STRL REUS W/TWL LRG LVL3 (GOWN DISPOSABLE) ×9 IMPLANT
GOWN STRL REUS W/TWL XL LVL3 (GOWN DISPOSABLE) IMPLANT
GRASPER SUT TROCAR 14GX15 (MISCELLANEOUS) ×1 IMPLANT
KIT BASIN OR (CUSTOM PROCEDURE TRAY) ×3 IMPLANT
MARKER SKIN DUAL TIP RULER LAB (MISCELLANEOUS) ×3 IMPLANT
NDL INSUFFLATION 14GA 120MM (NEEDLE) ×2 IMPLANT
NDL SPNL 22GX3.5 QUINCKE BK (NEEDLE) IMPLANT
NEEDLE HYPO 22GX1.5 SAFETY (NEEDLE) ×3 IMPLANT
NEEDLE INSUFFLATION 14GA 120MM (NEEDLE) ×3 IMPLANT
NEEDLE SPNL 22GX3.5 QUINCKE BK (NEEDLE) ×3 IMPLANT
OBTURATOR OPTICAL STANDARD 8MM (TROCAR)
OBTURATOR OPTICAL STND 8 DVNC (TROCAR)
OBTURATOR OPTICALSTD 8 DVNC (TROCAR) IMPLANT
PAD POSITIONING PINK XL (MISCELLANEOUS) ×3 IMPLANT
PENCIL SMOKE EVACUATOR (MISCELLANEOUS) IMPLANT
SCISSORS LAP 5X35 DISP (ENDOMECHANICALS) ×1 IMPLANT
SEAL CANN UNIV 5-8 DVNC XI (MISCELLANEOUS) ×8 IMPLANT
SEAL XI 5MM-8MM UNIVERSAL (MISCELLANEOUS) ×12
SEALER VESSEL DA VINCI XI (MISCELLANEOUS) ×3
SEALER VESSEL EXT DVNC XI (MISCELLANEOUS) ×2 IMPLANT
SET IRRIG TUBING LAPAROSCOPIC (IRRIGATION / IRRIGATOR) ×3 IMPLANT
SLEEVE ADV FIXATION 5X100MM (TROCAR) ×1 IMPLANT
SOL ANTI FOG 6CC (MISCELLANEOUS) ×2 IMPLANT
SOLUTION ANTI FOG 6CC (MISCELLANEOUS) ×1
SOLUTION ELECTROLUBE (MISCELLANEOUS) ×3 IMPLANT
SUT ETHIBOND 0 36 GRN (SUTURE) ×4 IMPLANT
SUT ETHILON 3 0 PS 1 (SUTURE) ×1 IMPLANT
SUT MNCRL AB 4-0 PS2 18 (SUTURE) ×3 IMPLANT
SUT SILK 0 SH 30 (SUTURE) ×2 IMPLANT
SUT SILK 2 0 SH (SUTURE) ×3 IMPLANT
SUT VIC AB 3-0 SH 27 (SUTURE) ×6
SUT VIC AB 3-0 SH 27XBRD (SUTURE) IMPLANT
SYR 20ML LL LF (SYRINGE) ×3 IMPLANT
TIP INNERVISION DETACH 40FR (MISCELLANEOUS) IMPLANT
TIP INNERVISION DETACH 50FR (MISCELLANEOUS) IMPLANT
TIP INNERVISION DETACH 56FR (MISCELLANEOUS) IMPLANT
TIPS INNERVISION DETACH 40FR (MISCELLANEOUS)
TOWEL OR 17X26 10 PK STRL BLUE (TOWEL DISPOSABLE) ×3 IMPLANT
TOWEL OR NON WOVEN STRL DISP B (DISPOSABLE) ×3 IMPLANT
TRAY FOLEY MTR SLVR 14FR STAT (SET/KITS/TRAYS/PACK) ×1 IMPLANT
TROCAR ADV FIXATION 5X100MM (TROCAR) ×3 IMPLANT
TUBE GASTROSTOMY 18F (CATHETERS) ×1 IMPLANT
TUBING INSUFFLATION 10FT LAP (TUBING) ×3 IMPLANT

## 2019-09-29 NOTE — Anesthesia Preprocedure Evaluation (Signed)
Anesthesia Evaluation  Patient identified by MRN, date of birth, ID band Patient awake    Reviewed: Allergy & Precautions, NPO status , Patient's Chart, lab work & pertinent test results  Airway Mallampati: I  TM Distance: >3 FB Neck ROM: Full    Dental   Pulmonary sleep apnea , COPD, former smoker,    Pulmonary exam normal        Cardiovascular hypertension, Pt. on medications Normal cardiovascular exam     Neuro/Psych    GI/Hepatic GERD  Medicated and Controlled,  Endo/Other    Renal/GU      Musculoskeletal   Abdominal   Peds  Hematology   Anesthesia Other Findings   Reproductive/Obstetrics                             Anesthesia Physical Anesthesia Plan  ASA: III  Anesthesia Plan: General   Post-op Pain Management:    Induction: Intravenous  PONV Risk Score and Plan: 3 and Ondansetron, Midazolam and Treatment may vary due to age or medical condition  Airway Management Planned: Oral ETT  Additional Equipment:   Intra-op Plan:   Post-operative Plan: Extubation in OR  Informed Consent: I have reviewed the patients History and Physical, chart, labs and discussed the procedure including the risks, benefits and alternatives for the proposed anesthesia with the patient or authorized representative who has indicated his/her understanding and acceptance.       Plan Discussed with: CRNA and Surgeon  Anesthesia Plan Comments:         Anesthesia Quick Evaluation

## 2019-09-29 NOTE — H&P (Signed)
Surgical H&P  Chief Complaint: dysphagia  HPI: 69yo woman with worsening dysphagia s/p nissen in July last year. Unfortunately endoscopic dilation was unsuccessful.   Allergies  Allergen Reactions  . Tetracyclines & Related Other (See Comments)    Pt developed gastritis while taking tetracycline  . Aspirin     bruises  . Norvasc [Amlodipine Besylate]     Made BP drop low  . Nsaids     bruising  . Dexilant [Dexlansoprazole] Diarrhea    Past Medical History:  Diagnosis Date  . Anginal pain ([place]) 2015  . Dysrhythmia 2015   SVT  . GERD (gastroesophageal reflux disease)   . Hiatal hernia   . History of degenerative disc disease   . History of nuclear stress test 07/27/2013   in care everywhere per cardiology note by dr Donata Clay:  test normal  . Hypertension   . Hypothyroidism   . Intermittent palpitations   . MVP (mitral valve prolapse)    per pt dx 1980s,  last echo done 2016 by dr Donata Clay [facility] cardiology in [place]) and last cardiology visit 11-02-2014 in epic;   01-27-2019 per pt episodic palpitations and takes toprol, currently followed by pcp  . Narcolepsy   . OSA (obstructive sleep apnea)    Not on CPAP  . TMJ arthralgia     Past Surgical History:  Procedure Laterality Date  . ABDOMINAL HYSTERECTOMY    . BIOPSY  06/24/2018   Procedure: BIOPSY;  Surgeon: Daneil Dolin, MD;  [facility];  Service: Endoscopy;;  (gastric) (colon)  . COLONOSCOPY N/A 06/24/2018   Procedure: COLONOSCOPY;  Surgeon: Daneil Dolin, MD;  [facility];  Service: Endoscopy;  Laterality: N/A;  1:45pm  . ESOPHAGEAL DILATION  09/02/2019   Procedure: ESOPHAGEAL DILATION;  Surgeon: Mauri Pole, MD;  Location: [facility];  Service: Endoscopy;;  . ESOPHAGEAL MANOMETRY N/A 09/02/2019   Procedure: ESOPHAGEAL MANOMETRY (EM);  Surgeon: Mauri Pole, MD;  Location: [facility];  Service: Endoscopy;  Laterality: N/A;  . ESOPHAGOGASTRODUODENOSCOPY N/A 06/24/2018   Procedure: ESOPHAGOGASTRODUODENOSCOPY (EGD);  Surgeon: Daneil Dolin, MD;  [facility];  Service: Endoscopy;  Laterality: N/A;  . ESOPHAGOGASTRODUODENOSCOPY N/A 07/14/2019   Procedure: ESOPHAGOGASTRODUODENOSCOPY (EGD);  Surgeon: Daneil Dolin, MD;  [facility];  Service: Endoscopy;  Laterality: N/A;  3:00pm  . ESOPHAGOGASTRODUODENOSCOPY (EGD) WITH PROPOFOL N/A 09/02/2019   Procedure: ESOPHAGOGASTRODUODENOSCOPY (EGD) WITH PROPOFOL;  Surgeon: Mauri Pole, MD;  [facility];  Service: Endoscopy;  Laterality: N/A;   mano probe placement  . EYE SURGERY Bilateral    rk  . MALONEY DILATION N/A 06/24/2018   Procedure: Venia Minks DILATION;  Surgeon: Daneil Dolin, MD;  [facility];  Service: Endoscopy;  Laterality: N/A;  . Venia Minks DILATION N/A 07/14/2019   Procedure: Venia Minks DILATION;  Surgeon: Daneil Dolin, MD;  [facility];  Service: Endoscopy;  Laterality: N/A;  . POLYPECTOMY  06/24/2018   Procedure: POLYPECTOMY;  Surgeon: Daneil Dolin, MD;  [facility];  Service: Endoscopy;;  colon   . RF ablation of cervical nerves    . ROTATOR CUFF REPAIR Right 11-21-2006  dr supple @[facility]     Family History  Problem Relation Age of Onset  . Colon cancer Maternal Grandfather   . Colon cancer Paternal Grandmother     Social History   Socioeconomic History  . Marital status: Married    Spouse name: Not on file  . Number  of children: Not on file  . Years of education: Not on file  . Highest education level: Not on file  Occupational History  . Not on file  Tobacco Use  . Smoking status: Former Smoker    Packs/day: 1.50    Types: Cigarettes    Start date: 1970    Quit date: 07/1997    Years since quitting: 22.1  . Smokeless tobacco: Never Used  Substance and Sexual Activity  . Alcohol use: Not Currently    Comment: none in many years  . Drug use: Never  . Sexual activity: Not on file  Other Topics Concern  .  Not on file  Social History Narrative  . Not on file   Social Determinants of Health   Financial Resource Strain:   . Difficulty of Paying Living Expenses: Not on file  Food Insecurity:   . Worried About Charity fundraiser in the Last Year: Not on file  . Ran Out of Food in the Last Year: Not on file  Transportation Needs:   . Lack of Transportation (Medical): Not on file  . Lack of Transportation (Non-Medical): Not on file  Physical Activity:   . Days of Exercise per Week: Not on file  . Minutes of Exercise per Session: Not on file  Stress:   . Feeling of Stress : Not on file  Social Connections:   . Frequency of Communication with Friends and Family: Not on file  . Frequency of Social Gatherings with Friends and Family: Not on file  . Attends Religious Services: Not on file  . Active Member of Clubs or Organizations: Not on file  . Attends Archivist Meetings: Not on file  . Marital Status: Not on file    No current facility-administered medications on file prior to encounter.   Current Outpatient Medications on File Prior to Encounter  Medication Sig Dispense Refill  . acetaminophen (TYLENOL) 650 MG CR tablet Take 1,300 mg by mouth every 8 (eight) hours as needed for pain.     Marland Kitchen atorvastatin (LIPITOR) 20 MG tablet Take 20 mg by mouth at bedtime.     . enalapril-hydrochlorothiazide (VASERETIC) 10-25 MG tablet Take 1 tablet by mouth daily.  1  . FLUoxetine (PROZAC) 10 MG capsule Take 10 mg by mouth daily.   0  . levothyroxine (SYNTHROID, LEVOTHROID) 50 MCG tablet Take 50 mcg by mouth daily before breakfast.   2  . methylphenidate (RITALIN) 20 MG tablet Take 20 mg by mouth 3 (three) times daily.    . metoprolol succinate (TOPROL-XL) 50 MG 24 hr tablet Take 50 mg by mouth at bedtime.   10  . Calcium Carbonate-Vitamin D (CALCIUM 600+D PO) Take 1 tablet by mouth daily.    . cholecalciferol (VITAMIN D3) 25 MCG (1000 UT) tablet Take 1,000 Units by mouth daily.    .  Multiple Vitamin (MULTIVITAMIN) tablet Take 1 tablet by mouth daily.    . NON FORMULARY CPAP at night    . valACYclovir (VALTREX) 1000 MG tablet Take 1,000 mg by mouth 2 (two) times daily as needed (outbreak).       Review of Systems: a complete, 10pt review of systems was completed with pertinent positives and negatives as documented in the HPI  Physical Exam: Vitals:   09/29/19 1120  BP: (!) 149/72  Pulse: (!) 55  Resp: 16  Temp: 97.6 F (36.4 C)  SpO2: 97%   Gen: A&Ox3, no distress     CBC Latest  Ref Rng & Units 09/24/2019 01/30/2019 01/29/2019  WBC 4.0 - 10.5 K/uL 7.9 8.4 9.1  Hemoglobin 12.0 - 15.0 g/dL 12.1 9.8(L) 10.2(L)  Hematocrit 36.0 - 46.0 % 37.3 31.1(L) 32.1(L)  Platelets 150 - 400 K/uL 375 256 309    CMP Latest Ref Rng & Units 09/24/2019 01/30/2019 01/29/2019  Glucose 70 - 99 mg/dL 95 86 110(H)  BUN 8 - 23 mg/dL 14 15 15   Creatinine 0.44 - 1.00 mg/dL 0.71 0.77 0.75  Sodium 135 - 145 mmol/L 140 142 139  Potassium 3.5 - 5.1 mmol/L 4.0 4.1 3.3(L)  Chloride 98 - 111 mmol/L 101 107 101  CO2 22 - 32 mmol/L 30 29 27   Calcium 8.9 - 10.3 mg/dL 9.1 8.0(L) 8.0(L)  Total Protein 6.5 - 8.1 g/dL 7.3 - -  Total Bilirubin 0.3 - 1.2 mg/dL 0.7 - -  Alkaline Phos 38 - 126 U/L 110 - -  AST 15 - 41 U/L 25 - -  ALT 0 - 44 U/L 15 - -    No results found for: INR, PROTIME  Imaging: No results found.   A/P: robotic nissen takedown and possible gastrostomy tube. We have had many discussions and she is fully understanding of the plan, the risks of surgery vs no surgery. Questions answered to her satisfaction.  ADMISSION STATUS: observation depending on post-op course  Patient Active Problem List   Diagnosis Date Noted  . Hx of sleep apnea 02/06/2019  . Other emphysema ([place]) 02/06/2019  . Acute respiratory failure with hypoxia ([place]) 02/06/2019  . Hiatal hernia with GERD without esophagitis 01/28/2019  . GERD (gastroesophageal reflux disease) 05/07/2018  . Dysphagia 05/07/2018  .  History of IBS 05/07/2018       Romana Juniper, MD [facility]  See AMION to contact appropriate on-call provider

## 2019-09-29 NOTE — Transfer of Care (Signed)
Immediate Anesthesia Transfer of Care Note  Patient: Robyn Ashley  Procedure(s) Performed: XI ROBOTIC NISSEN TAKEDOWN WITH INSERTION OF GASTROSTOMY TUBE (N/A Abdomen)  Patient Location: PACU  Anesthesia Type:General  Level of Consciousness: sedated, patient cooperative and responds to stimulation  Airway & Oxygen Therapy: Patient Spontanous Breathing and Patient connected to face mask oxygen  Post-op Assessment: Report given to RN and Post -op Vital signs reviewed and stable  Post vital signs: Reviewed and stable  Last Vitals:  Vitals Value Taken Time  BP 143/75 09/29/19 1545  Temp    Pulse 77 09/29/19 1546  Resp 22 09/29/19 1546  SpO2 99 % 09/29/19 1546  Vitals shown include unvalidated device data.  Last Pain:  Vitals:   09/29/19 1139  TempSrc:   PainSc: 0-No pain         Complications: No apparent anesthesia complications

## 2019-09-29 NOTE — Op Note (Addendum)
Operative Note  Robyn Ashley  [id number]  [id number]  09/29/2019   Surgeon: Victorino Sparrow ConnorMD FACS  Assistant: Greer Pickerel MD FACS  Procedure performed: Mechele Claude Robotic takedown of nissen fundoplication and gastrostomy tube insertion  Preop diagnosis: dysphagia due to narrowing at the gastroesophageal junction likely secondary to tight nissen  Post-op diagnosis/intraop findings: The fundoplication appeared loose and floppy. This was taken down. The hiatal hernia repair was intact, without impingement on the esophagus.  Scarring in the area was within expected limits. There was no other extrinsic or structural etiology of the GE junction narrowing present. G tube balloon with 7cc; phlange at 4cm which is slightly loose a the skin.   Specimens: no Retained items: 14fr gastrostomy tube EBL: minimal cc Complications: none  Description of procedure: After obtaining informed consent the patient was taken to the operating room and placed supine on operating room table wheregeneral endotracheal anesthesia was initiated, preoperative antibiotics were administered, SCDs applied, and a formal timeout was performed.  The abdomen was prepped and draped in the usual sterile fashion and peritoneal access was gained with a periumbilical Veress needle.  Peritoneal cavity was insufflated to 15 mmHg without incident.  An 8 mm trocar was inserted and the abdomen inspected and was found to be free of injury from our entry or gross abnormalities.  Bilateral laparoscopic assisted taps blocks were performed with Exparel mixed with quarter percent Marcaine.  2 left-sided and 2 right-sided trochars were then inserted under direct visualization.  The patient was then placed in steep reverse Trendelenburg and the liver retractor inserted through a subxiphoid incision for fixed retraction of the left lobe of the liver.  The robot was then docked and instruments inserted under direct visualization.  Minimal amount of  adhesions between the Nissen wrap and the undersurface of the liver were taken down sharply.  The Nissen was inspected, it appeared to be loose and I was able to introduce the bowel grasper through the wrap without any difficulty and distract the wrapped stomach from the underlying esophagus.  The 3 Ethibond sutures which had been used to create the wrap were located and divided and the wrap was taken down.  On the greater curvature on the left side of the stomach that had been unwrapped, there was concern for a small serosal injury, which is an expected event in this case, and this was oversewn with interrupted 3-0 Vicryl's.  The stomach that was adherent along the right side of the esophagus was left in situ in order to both minimize dissection through cicatrix/risk of injury to the esophagus as well as to hopefully afford some partial wrap anatomy for antireflux measures.  There was a small bleeding point on the anterior surface of the proximal stomach which was addressed with a figure-of-eight 3-0 Vicryl suture.  the hiatal hernia repair was inspected and was noted to be intact, and there was no impingement on the esophagus from this repair.  At this juncture a gastrostomy tube was placed.  A pursestring of 2-0 silk was placed along the greater curvature in the mid body of the stomach and a gastrotomy made.  3 stay sutures of 2-0 silk were placed along the stomach around the G-tube site. An 69 French gastrostomy tube was inserted through an incision in the epigastrium and into the stomach, after testing the balloon integrity. The pursestring was then tied down.  The laparoscopic suture passer was used to bring the 3 stay sutures through the abdominal wall in the  transfascial fashion in order to Stamm the stomach to the abdominal wall, where it rested easily with no tension once the abdomen was desufflated.  These were tied down.  The bottom of the flange of the G-tube sits at 4 cm.  Three 3-0 nylons were used to  secure the flange to the skin.  The proximal stomach, distal esophagus and hiatus were then closely inspected and hemostasis confirmed, there was no injury to these structures. The liver retractor was then removed under direct visualization.  The abdomen was desufflated and remaining trochars removed.  The skin incisions were closed with subcuticular Monocryl and Dermabond.  The patient was then awakened, extubated and taken to PACU in stable condition.   All counts were correct at the completion of the case.   Photo: Nissen wrap after dissecting minimal adhesion to undersurface of liver. Wrap easily accomodates open tip-up grasper.

## 2019-09-29 NOTE — Anesthesia Postprocedure Evaluation (Signed)
Anesthesia Post Note  Patient: Robyn Ashley  Procedure(s) Performed: XI ROBOTIC NISSEN TAKEDOWN WITH INSERTION OF GASTROSTOMY TUBE (N/A Abdomen)     Patient location during evaluation: PACU Anesthesia Type: General Level of consciousness: awake and alert Pain management: pain level controlled Vital Signs Assessment: post-procedure vital signs reviewed and stable Respiratory status: spontaneous breathing, nonlabored ventilation, respiratory function stable and patient connected to nasal cannula oxygen Cardiovascular status: blood pressure returned to baseline and stable Postop Assessment: no apparent nausea or vomiting Anesthetic complications: no    Last Vitals:  Vitals:   09/29/19 1615 09/29/19 1630  BP: 130/72 136/69  Pulse: 67 77  Resp: 11 13  Temp:    SpO2: 96% 94%    Last Pain:  Vitals:   09/29/19 1615  TempSrc:   PainSc: Asleep                 Daved Mcfann DAVID

## 2019-09-29 NOTE — Anesthesia Procedure Notes (Signed)
Procedure Name: Intubation Performed by: Bernadett Milian J, CRNA Pre-anesthesia Checklist: Patient identified, Emergency Drugs available, Suction available, Patient being monitored and Timeout performed Patient Re-evaluated:Patient Re-evaluated prior to induction Oxygen Delivery Method: Circle system utilized Preoxygenation: Pre-oxygenation with 100% oxygen Induction Type: IV induction and Rapid sequence Laryngoscope Size: Mac and 3 Grade View: Grade I Tube type: Oral Tube size: 7.0 mm Number of attempts: 1 Airway Equipment and Method: Stylet Placement Confirmation: ETT inserted through vocal cords under direct vision,  positive ETCO2 and breath sounds checked- equal and bilateral Secured at: 21 cm Tube secured with: Tape Dental Injury: Teeth and Oropharynx as per pre-operative assessment        

## 2019-09-30 ENCOUNTER — Encounter: Payer: Self-pay | Admitting: *Deleted

## 2019-09-30 DIAGNOSIS — E43 Unspecified severe protein-calorie malnutrition: Secondary | ICD-10-CM | POA: Diagnosis present

## 2019-09-30 LAB — BASIC METABOLIC PANEL
Anion gap: 7 (ref 5–15)
BUN: 13 mg/dL (ref 8–23)
CO2: 27 mmol/L (ref 22–32)
Calcium: 8.2 mg/dL — ABNORMAL LOW (ref 8.9–10.3)
Chloride: 105 mmol/L (ref 98–111)
Creatinine, Ser: 0.7 mg/dL (ref 0.44–1.00)
GFR calc Af Amer: >60 mL/min (ref >60)
GFR calc non Af Amer: >60 mL/min (ref >60)
Glucose, Bld: 109 mg/dL — ABNORMAL HIGH (ref 70–99)
Potassium: 3.6 mmol/L (ref 3.5–5.1)
Sodium: 139 mmol/L (ref 135–145)

## 2019-09-30 LAB — CBC
HCT: 32 % — ABNORMAL LOW (ref 36.0–46.0)
Hemoglobin: 10.3 g/dL — ABNORMAL LOW (ref 12.0–15.0)
MCH: 29.4 pg (ref 26.0–34.0)
MCHC: 32.2 g/dL (ref 30.0–36.0)
MCV: 91.4 fL (ref 80.0–100.0)
Platelets: 297 10*3/uL (ref 150–400)
RBC: 3.5 MIL/uL — ABNORMAL LOW (ref 3.87–5.11)
RDW: 12.7 % (ref 11.5–15.5)
WBC: 8 10*3/uL (ref 4.0–10.5)
nRBC: 0 % (ref 0.0–0.2)

## 2019-09-30 LAB — MAGNESIUM: Magnesium: 1.5 mg/dL — ABNORMAL LOW (ref 1.7–2.4)

## 2019-09-30 MED ORDER — DOCUSATE SODIUM 100 MG PO CAPS: 100.0000 mg | ORAL_CAPSULE | Freq: Two times a day (BID) | ORAL | 0 refills | Status: DC

## 2019-09-30 MED ORDER — OSMOLITE 1.2 CAL PO LIQD
1000.0000 mL | ORAL | Status: DC
  Filled 2019-09-30: qty 1000

## 2019-09-30 MED ORDER — ONDANSETRON 4 MG PO TBDP: 4.0000 mg | ORAL_TABLET | Freq: Four times a day (QID) | ORAL | 0 refills | Status: DC | PRN

## 2019-09-30 MED ORDER — ADULT MULTIVITAMIN W/MINERALS CH: 1.0000 | ORAL_TABLET | Freq: Every day | ORAL | Status: DC

## 2019-09-30 MED ORDER — ENSURE ENLIVE PO LIQD: 237.0000 mL | Freq: Two times a day (BID) | ORAL | Status: DC

## 2019-09-30 MED ORDER — TRAMADOL HCL 50 MG PO TABS: 50.0000 mg | ORAL_TABLET | Freq: Four times a day (QID) | ORAL | 0 refills | Status: DC | PRN

## 2019-09-30 MED ORDER — MAGNESIUM SULFATE 4 GM/100ML IV SOLN
4.0000 g | Freq: Once | INTRAVENOUS | Status: AC
  Administered 2019-09-30: 4 g via INTRAVENOUS
  Filled 2019-09-30: qty 100

## 2019-09-30 MED ORDER — ENSURE ENLIVE PO LIQD: 1.0000 | Freq: Two times a day (BID) | ORAL | 0 refills | Status: DC

## 2019-09-30 MED ORDER — ONDANSETRON HCL 4 MG/2ML IJ SOLN
4.0000 mg | Freq: Once | INTRAMUSCULAR | Status: AC
  Administered 2019-09-30: 4 mg via INTRAVENOUS
  Filled 2019-09-30: qty 2

## 2019-09-30 NOTE — Progress Notes (Signed)
Initial Nutrition Assessment  DOCUMENTATION CODES:   Severe malnutrition in context of chronic illness  INTERVENTION:  Ensure Enlive po BID, each supplement provides 350 kcal and 20 grams of protein (vanilla)  Magic cup TID with meals, each supplement provides 290 kcal and 9 grams of protein  MVI with minerals daily  Provided education  Monitor toleration of soft diet and need for nutrition support  NUTRITION DIAGNOSIS:   Severe Malnutrition related to chronic illness as evidenced by per patient/family report, energy intake < or equal to 75% for > or equal to 1 month, percent weight loss, mild muscle depletion, moderate muscle depletion, mild fat depletion, moderate fat depletion.  GOAL:   Patient will meet greater than or equal to 90% of their needs   MONITOR:   PO intake, Labs, Weight trends, Supplement acceptance, Skin, I & O's  REASON FOR ASSESSMENT:   Malnutrition Screening Tool    ASSESSMENT:   70 year old female with past medical history significant of GERD, HTN, MVP, OSA not on CPAP, hypothyroidism, h/o nissen fundoplicationpresented with worsening dysphagia  Patient is s/p Xi Robotic takedown of nissen fundoplication and gastrostomy tube insertion on 3/2.  Per surgery notes: -trial soft diet -home tube feeds if persistent dysphagia  Patient awake, alert, and sitting up in bed at RD visit this morning. She reports feeling good today, endorses appetite and eager to try soft diet. Diet advanced this morning, reports having clears for breakfast. Patient stated that she has been on a liquid diet s/p hernia surgery in July, recalls 2-3 Premier Protein daily, potato/tomato soup, twice baked pots, mashed pots, and Velveeta small shells and cheese.   RD educated on soft diet, small bites, chewing food well, sitting upright to eat meals, avoid laying down for 1 hour after eating as well as provided food recommendations (fish, chicken, eggs, avocado, well cooked soft  vegetables, yogurt, high calorie/protein shakes ideas) and encouraged patient to continue drinking 2-3 nutritional supplements daily. Patient amenable to vanilla Ensure and Magic Cup supplements during admission.  Current wt 120.12 lbs UBW 160 lbs in June 2020 per pt Per review of history, weights have been trending down over the past 8 months. On 01/30/19 pt wt 69 kg (151.8 lbs), on 03/18/19 pt wt 64.9 kg (142.78 lbs), on 04/14/19 pt wt 64.1 kg (141.02 lbs), on10/27/20 pt wt 63 kg (138.6 lbs), on 06/16/19 pt wt 60.7 kg (133.54 lbs), on 1/20 pt wt 55.8 kg (122.76 lbs). In the past 6 months, pt has experienced ~21 lb wt loss (14.8%) which is severe for time frame.   Medications reviewed and include: Colace, Gabapentin, Ritalin, Protonix  Labs: BG 109, Mg 1.5 (L)  NUTRITION - FOCUSED PHYSICAL EXAM:    Most Recent Value  Orbital Region  Moderate depletion  Upper Arm Region  Moderate depletion  Thoracic and Lumbar Region  Mild depletion  Buccal Region  Moderate depletion  Temple Region  Moderate depletion  Clavicle Bone Region  Moderate depletion  Clavicle and Acromion Bone Region  Mild depletion  Scapular Bone Region  Mild depletion  Dorsal Hand  Moderate depletion  Patellar Region  Moderate depletion  Anterior Thigh Region  Moderate depletion  Posterior Calf Region  Moderate depletion  Edema (RD Assessment)  None  Hair  Reviewed [easily plucked, pt reports it has been falling out]  Eyes  Reviewed  Mouth  Reviewed  Skin  Reviewed [dry,  flaky]  Nails  Reviewed       Diet Order:  Diet Order            DIET SOFT Room service appropriate? Yes; Fluid consistency: Thin  Diet effective now              EDUCATION NEEDS:   Education needs have been addressed  Skin:  Skin Assessment: Skin Integrity Issues: Skin Integrity Issues:: Incisions Incisions: closed; L abdomen  Last BM:  3/2  Height:   Ht Readings from Last 1 Encounters:  09/29/19 5\' 1"  (1.549 m)    Weight:    Wt Readings from Last 1 Encounters:  09/29/19 54.6 kg     BMI:  Body mass index is 22.73 kg/m.  Estimated Nutritional Needs:   Kcal:  1600-1800  Protein:  80-90  Fluid:  >/= 1.6 L/day    Lajuan Lines, RD, LDN Clinical Nutrition After Hours/Weekend Pager # in [place]

## 2019-09-30 NOTE — Progress Notes (Signed)
1 Day Post-Op   Subjective/Chief Complaint: Pain well controlled. Slept well. No problems with liquids.    Objective: Vital signs in last 24 hours: Temp:  [97.3 F (36.3 C)-98.4 F (36.9 C)] 98.4 F (36.9 C) (03/03 0451) Pulse Rate:  [55-78] 72 (03/03 0519) Resp:  [9-18] 16 (03/03 0451) BP: (94-149)/(53-82) 105/61 (03/03 0519) SpO2:  [94 %-100 %] 99 % (03/03 0451) Weight:  [54.6 kg] 54.6 kg (03/02 1648) Last BM Date: 09/28/19  Intake/Output from previous day: 03/02 0701 - 03/03 0700 In: 2827.5 [P.O.:360; I.V.:2367.5; IV Piggyback:100] Out: 625 [Urine:600; Blood:25] Intake/Output this shift: No intake/output data recorded.  General appearance: alert and cooperative Resp: unlabored Cardio: regular rate and rhythm GI: abdomen soft, minimally appropriately tender, incisions c/d/i no cellulitis or hematoma. G tube site dressing dry and intact   Lab Results:  Recent Labs    09/30/19 0416  WBC 8.0  HGB 10.3*  HCT 32.0*  PLT 297   BMET Recent Labs    09/30/19 0416  NA 139  K 3.6  CL 105  CO2 27  GLUCOSE 109*  BUN 13  CREATININE 0.70  CALCIUM 8.2*   PT/INR No results for input(s): LABPROT, INR in the last 72 hours. ABG No results for input(s): PHART, HCO3 in the last 72 hours.  Invalid input(s): PCO2, PO2  Studies/Results: No results found.  Anti-infectives: Anti-infectives (From admission, onward)   Start     Dose/Rate Route Frequency Ordered Stop   09/29/19 1130  ceFAZolin (ANCEF) IVPB 2g/100 mL premix     2 g 200 mL/hr over 30 Minutes Intravenous On call to O.R. 09/29/19 1115 09/29/19 1332      Assessment/Plan: s/p Procedure(s): XI ROBOTIC NISSEN TAKEDOWN WITH INSERTION OF GASTROSTOMY TUBE (N/A) Trial soft diet. If tolerates may be discharged this afternoon. If persistent dysphagia will need to organize tube feeds for home.   LOS: 1 day    Clovis Riley 09/30/2019

## 2019-09-30 NOTE — Progress Notes (Addendum)
MD paged regarding the pt vomiting after eating soft food for the 2nd time. Pt reported the food feels stuck in her throat and makes her nauseous. MD made aware of this at lunch time, but pt did not vomit and reported it got better at that time. Zofran given at 1414. Pt requesting additional nausea med.   [name], MD returned page and gave verbal orders to give an additional one time dose of IV Zofran 4 mg for nausea and hook the gastric tube up to gravity. Pt to only have clears, no soft foods.

## 2019-10-01 LAB — BASIC METABOLIC PANEL
Anion gap: 9 (ref 5–15)
BUN: 13 mg/dL (ref 8–23)
CO2: 29 mmol/L (ref 22–32)
Calcium: 8.5 mg/dL — ABNORMAL LOW (ref 8.9–10.3)
Chloride: 104 mmol/L (ref 98–111)
Creatinine, Ser: 0.56 mg/dL (ref 0.44–1.00)
GFR calc Af Amer: >60 mL/min (ref >60)
GFR calc non Af Amer: >60 mL/min (ref >60)
Glucose, Bld: 94 mg/dL (ref 70–99)
Potassium: 3.3 mmol/L — ABNORMAL LOW (ref 3.5–5.1)
Sodium: 142 mmol/L (ref 135–145)

## 2019-10-01 LAB — CBC
HCT: 30.3 % — ABNORMAL LOW (ref 36.0–46.0)
Hemoglobin: 9.7 g/dL — ABNORMAL LOW (ref 12.0–15.0)
MCH: 29.4 pg (ref 26.0–34.0)
MCHC: 32 g/dL (ref 30.0–36.0)
MCV: 91.8 fL (ref 80.0–100.0)
Platelets: 302 10*3/uL (ref 150–400)
RBC: 3.3 MIL/uL — ABNORMAL LOW (ref 3.87–5.11)
RDW: 12.8 % (ref 11.5–15.5)
WBC: 8.4 10*3/uL (ref 4.0–10.5)
nRBC: 0 % (ref 0.0–0.2)

## 2019-10-01 LAB — GLUCOSE, CAPILLARY
Glucose-Capillary: 83 mg/dL (ref 70–99)
Glucose-Capillary: 98 mg/dL (ref 70–99)
Glucose-Capillary: 95 mg/dL (ref 70–99)

## 2019-10-01 LAB — MAGNESIUM: Magnesium: 1.8 mg/dL (ref 1.7–2.4)

## 2019-10-01 MED ORDER — OSMOLITE 1.2 CAL PO LIQD: 237.0000 mL | Freq: Every day | ORAL | 0 refills | Status: AC

## 2019-10-01 MED ORDER — FREE WATER
100.0000 mL | Freq: Every day | Status: DC
  Administered 2019-10-01: 100 mL

## 2019-10-01 MED ORDER — POTASSIUM CHLORIDE 20 MEQ PO PACK
40.0000 meq | PACK | Freq: Once | ORAL | Status: AC
  Administered 2019-10-01: 40 meq via ORAL
  Filled 2019-10-01: qty 2

## 2019-10-01 MED ORDER — ACETAMINOPHEN 160 MG/5ML PO SOLN: 650.0000 mg | Freq: Four times a day (QID) | ORAL | Status: DC | PRN

## 2019-10-01 MED ORDER — OSMOLITE 1.2 CAL PO LIQD
237.0000 mL | Freq: Every day | ORAL | Status: DC
  Administered 2019-10-01 (×2): 237 mL
  Filled 2019-10-01 (×4): qty 237

## 2019-10-01 NOTE — Progress Notes (Addendum)
Nutrition Follow-up  DOCUMENTATION CODES:   Severe malnutrition in context of chronic illness  INTERVENTION:  - will order 237 ml Osmolite 1.2 x5/day with 50 ml before and 50 ml after each TF bolus.   - this regimen will provide 1425 kcal (89% estimated kcal need), 66 grams protein (82% estimated protein need), and 1475 ml water.   - continue Boost Breeze TID, each supplement provides 250 kcal and 9 grams protein.  - recommended Smarty Pants gummy multivitamin for home use.    NUTRITION DIAGNOSIS:   Severe Malnutrition related to chronic illness as evidenced by per patient/family report, energy intake < or equal to 75% for > or equal to 1 month, percent weight loss, mild muscle depletion, moderate muscle depletion, mild fat depletion, moderate fat depletion. -ongoing  GOAL:   Patient will meet greater than or equal to 90% of their needs -to be met with TF regimen and CLD  MONITOR:   PO intake, TF tolerance, Supplement acceptance, Labs, Weight trends  REASON FOR ASSESSMENT:   Consult Enteral/tube feeding initiation and management  ASSESSMENT:   69 year old female with past medical history significant of GERD, HTN, MVP, OSA not on CPAP, hypothyroidism, h/o nissen fundoplicationpresented with worsening dysphagia  POD #2 takedown of Nissen Fundoplication and G-tube placement.   Patient sitting in bed. Talked with her about TF regimen outlined above and about multivitamin for home. Encouraged TF formula to be room temperature and to be infused over 10-15 minutes minimum. All questions/concerns answered. Patient is feeling comfortable with the need for TF and we were able to talk about this being a step in helping her re-gain strength and ability to hopefully be able to consume more items orally moving forward.   She is able to consume clears without issue but creamy/milky items have been causing early satiety and feelings of indigestion. She has enjoyed boost breeze and her  daughter ordered a case which will delivered to the home tomorrow.     Labs reviewed; K: 3.3 mmol/l, Ca: 8.5 mg/dl. Medications reviewed; 100 mg colace BID, 50 mcg oral synthroid/day, daily multivitamin with minerals, 40 mg IV protonix/day, 40 mEq Klor-Con x1 dose 3/4.    Diet Order:   Diet Order            DIET SOFT Room service appropriate? Yes; Fluid consistency: Thin  Diet effective now              EDUCATION NEEDS:   Education needs have been addressed  Skin:  Skin Assessment: Skin Integrity Issues: Skin Integrity Issues:: Incisions Incisions: closed; L abdomen  Last BM:  3/2  Height:   Ht Readings from Last 1 Encounters:  09/29/19 '5\' 1"'  (1.549 m)    Weight:   Wt Readings from Last 1 Encounters:  10/01/19 57.2 kg    Ideal Body Weight:     BMI:  Body mass index is 23.83 kg/m.  Estimated Nutritional Needs:   Kcal:  1600-1800  Protein:  80-90  Fluid:  >/= 1.6 L/day     Jarome Matin, MS, RD, LDN, CNSC Inpatient Clinical Dietitian RD pager # available in [facility]  After hours/weekend pager # available in [facility]

## 2019-10-01 NOTE — Progress Notes (Signed)
Bolus tube feeds, 5 times daily, Osmolite 1.2 CAL (245mL/8oz per dose)   Per dietician consult, MD verified.    Ordered for patient discharge per verbal order from Attending MD.

## 2019-10-01 NOTE — Discharge Summary (Signed)
Physician Discharge Summary  Patient ID: Robyn Ashley MRN: [id number] DOB/AGE: Jun 29, 1951 69 y.o.  Admit date: 09/29/2019 Discharge date: 10/01/2019  Admission Diagnoses:  Discharge Diagnoses:  Active Problems:   Dysphagia   Protein-calorie malnutrition, severe   Discharged Condition: stable  Hospital Course: She underwent robotic takedown of Nissen fundoplication and gastrostomy tube placement which was without complication.  On postop day 1 she was tolerating clears without issue, and pain was well controlled.  She was advanced to a soft diet however experienced persistent dysphagia, essentially unchanged from preoperatively.  Tube feeds were initiated on postop day 2 and she tolerated these well.  She underwent education with nursing staff.  She was deemed stable for discharge home.  Consults:  Dietitian  Significant Diagnostic Studies: labs: See epic  Treatments: surgery: As above  Discharge Exam: Blood pressure 115/66, pulse (!) 59, temperature 97.6 F (36.4 C), temperature source Oral, resp. rate 18, height 5\' 1"  (1.549 m), weight 57.2 kg, SpO2 99 %. See rounding note  Disposition: Discharge disposition: 01-Home or Self Care         Allergies as of 10/01/2019       Reactions   Tetracyclines & Related Other (See Comments)   Pt developed gastritis while taking tetracycline   Aspirin    bruises   Norvasc [amlodipine Besylate]    Made BP drop low   Nsaids    bruising   Dexilant [dexlansoprazole] Diarrhea        Medication List     TAKE these medications    acetaminophen 650 MG CR tablet Commonly known as: TYLENOL Take 1,300 mg by mouth every 8 (eight) hours as needed for pain.   atorvastatin 20 MG tablet Commonly known as: LIPITOR Take 20 mg by mouth at bedtime.   CALCIUM 600+D PO Take 1 tablet by mouth daily.   cholecalciferol 25 MCG (1000 UNIT) tablet Commonly known as: VITAMIN D3 Take 1,000 Units by mouth daily.   docusate sodium 100 MG  capsule Commonly known as: COLACE Take 1 capsule (100 mg total) by mouth 2 (two) times daily.   enalapril-hydrochlorothiazide 10-25 MG tablet Commonly known as: VASERETIC Take 1 tablet by mouth daily. Notes to patient: We did not continue this while you were in the hospital, and your blood pressure while in the hospital was low- normal range. You may be able to discontinue or decrease this medication- please monitor your blood pressure and follow up with your PCP   feeding supplement (OSMOLITE 1.2 CAL) Liqd Place 237 mLs into feeding tube 5 (five) times daily. Flush tube with 62mL water before and after each bolus.   FLUoxetine 10 MG capsule Commonly known as: PROZAC Take 10 mg by mouth daily.   levothyroxine 50 MCG tablet Commonly known as: SYNTHROID Take 50 mcg by mouth daily before breakfast.   methylphenidate 20 MG tablet Commonly known as: RITALIN Take 20 mg by mouth 3 (three) times daily.   metoprolol succinate 50 MG 24 hr tablet Commonly known as: TOPROL-XL Take 50 mg by mouth at bedtime.   multivitamin tablet Take 1 tablet by mouth daily.   NON FORMULARY CPAP at night   ondansetron 4 MG disintegrating tablet Commonly known as: ZOFRAN-ODT Take 1 tablet (4 mg total) by mouth every 6 (six) hours as needed for nausea.   traMADol 50 MG tablet Commonly known as: ULTRAM Take 1 tablet (50 mg total) by mouth every 6 (six) hours as needed (pain not relieved by tylenol or other measures).  valACYclovir 1000 MG tablet Commonly known as: VALTREX Take 1,000 mg by mouth 2 (two) times daily as needed (outbreak).       Follow-up Information     Clovis Riley, MD Follow up in 2 week(s).   Specialty: General Surgery Contact information: [address] [place] [address] [phone]            Signed: Clovis Riley 10/01/2019, 4:18 PM

## 2019-10-01 NOTE — Discharge Instructions (Signed)
-Diet as tolerated -Use g-tube for nutrition as taught in the hospital. If you run out of tube feeds, you can administer boost or ensure through the tube  Dietician's recommendations: - 237 ml Osmolite 1.2, 5 times daily, with 50 ml before and 50 ml after each bolus.  - continue Boost Breeze TID, each supplement provides 250 kcal and 9 grams protein. - recommended Smarty Pants gummy multivitamin for home use.  -ok to shower and pat dry. Keep g-tube site covered, see care instructions below.  Gastrostomy Tube Home Guide, Adult A gastrostomy tube, or G-tube, is a tube that is inserted through the abdomen into the stomach. The tube is used to give feedings and medicines when a person is unable to eat and drink enough on his or her own. How to care for a G-tube Supplies needed  Saline solution or clean, warm water and soap.  Cotton swab or gauze.  Precut gauze bandage (dressing) and tape, if needed. Instructions 1. Wash your hands with soap and water. 2. If there is a dressing between the person's skin and the tube, remove it. 3. Check the area where the tube enters the skin. Check for problems such as: ? Redness. ? Swelling. ? Pus-like drainage. ? Extra skin growth. 4. Moisten the cotton swab with the saline solution or soap and water mixture. Gently clean around the insertion site. Remove any drainage or crusting. ? When the G-tube is first put in, a normal saline solution or water can be used to clean the skin. ? Mild soap and warm water can be used when the skin around the G-tube site has healed. 5. If there should be a dressing between the person's skin and the tube, apply it at this time. How to flush a G-tube Flush the G-tube regularly to keep it from clogging. Flush it before and after feedings and as often as told by the health care provider. Supplies needed  Purified or sterile water, warmed. If the person has a weak disease-fighting (immune) system, or if he or she has  difficulty fighting off infections (is immunocompromised), use only sterile water. ? If you are unsure about the amount of chemical contaminants in purified or drinking water, use sterile water. ? To purify drinking water by boiling:  Boil water for at least 1 minute. Keep lid over water while it boils. Allow water to cool to room temperature before using.  60cc G-tube syringe. Instructions 1. Wash your hands with soap and water. 2. Draw up 30 mL of warm water in a syringe. 3. Connect the syringe to the tube. 4. Slowly and gently push the water into the tube. G-tube problems and solutions  If the tube comes out: ? Cover the opening with a clean dressing and tape. ? Call a health care provider right away. ? A health care provider will need to put the tube back in within 4 hours.  If there is skin or scar tissue growing where the tube enters the skin: ? Keep the area clean and dry. ? Secure the tube with tape so that the tube does not move around too much. ? Call a health care provider.  If the tube gets clogged: ? Slowly push warm water into the tube with a large syringe. ? Do not force the fluid into the tube or push an object into the tube. ? If you are not able to unclog the tube, call a health care provider right away. Follow these instructions at home: Feedings  Give feedings at room temperature.  Cover and place unused feedings in the refrigerator.  If feedings are continuous: ? Do not put more than 4 hours worth of feedings in the feeding bag. ? Stop the feedings when you need to give medicine or flush the tube. Be sure to restart the feedings. ? Make sure the person's head is above his or her stomach (upright position). This will prevent choking and discomfort.  Replace feeding bags and syringes as told by the health care provider.  Make sure the person is in the right position during and after feedings: ? During feedings, the person's position should be in the  upright position. ? After a noncontinuous feeding (bolus feeding), have the person stay in the upright position for 1 hour. General instructions  Only use syringes made for G-tubes.  Do not pull or put tension on the tube.  Clamp the tube before removing the cap or disconnecting a syringe.  Measure the length of the G-tube every day from the insertion site to the end of the tube.  If the person's G-tube has a balloon, check the fluid in the balloon every week. The amount of fluid that should be in the balloon can be found in the manufacturer's specifications.  Make sure the person takes care of his or her oral health, such as by brushing his or her teeth.  Remove excess air from the G-tube as told by the person's health care provider. This is called "venting."  Keep the area where the tube enters the skin clean and dry.  Do not push feedings, medicines, or flushes rapidly. Contact a health care provider if:  The person with the tube has any of these problems: ? Constipation. ? Fever.  There is a large amount of fluid or mucus-like liquid leaking from the tube.  Skin or scar tissue appears to be growing where the tube enters the skin.  The length of tube from the insertion site to the G-tube gets longer. Get help right away if:  The person with the tube has any of these problems: ? Severe abdominal pain. ? Severe tenderness. ? Severe bloating. ? Nausea. ? Vomiting. ? Trouble breathing. ? Shortness of breath.  Any of these problems happen in the area where the tube enters the skin: ? Redness, irritation, swelling, or soreness. ? Pus-like discharge. ? A bad smell.  The tube is clogged and cannot be flushed.  The tube comes out. Summary  A gastrostomy tube, or G-tube, is a tube that is inserted through the abdomen into the stomach. The tube is used to give feedings and medicines when a person is unable to eat and drink enough on his or her own.  Check and clean the  insertion site daily as told by the person's health care provider.  Flush the G-tube regularly to keep it from clogging. Flush it before and after feedings and as often as told by the person's health care provider.  Keep the area where the tube enters the skin clean and dry. This information is not intended to replace advice given to you by your health care provider. Make sure you discuss any questions you have with your health care provider. Document Revised: 06/28/2017 Document Reviewed: 09/10/2016 Elsevier Patient Education  2020 [employer].

## 2019-10-01 NOTE — Progress Notes (Signed)
  Dietician consult placed by attending physician at [facility] today.   Patient pending discharge if she tolerates bolus G-tube feedings.   Dietician paged at 1130, and again at 1300. No return call received.   Called [facility] operator to obtain number. Called [phone], and spoke with a Product/process development scientist.   She reported that she would page that staff member about the patient needs.

## 2019-10-01 NOTE — TOC Transition Note (Addendum)
Transition of Care [facility]) - CM/SW Discharge Note   Patient Details  Name: Robyn Ashley MRN: [id number] Date of Birth: 1950/12/07  Transition of [facility]) CM/SW Contact:  Lia Hopping, [place] Phone Number: 10/01/2019, 5:15 PM   Clinical Narrative:   Plan of Care discussed with the patient. Patient will have family support.   No other needs identified.    Final next level of care: Home/Self Care Barriers to Discharge: No Barriers Identified   Patient Goals and CMS Choice     Choice offered to / list presented to : NA  Discharge Placement                       Discharge Plan and Services In-house Referral: NA Discharge Planning Services: CM Consult            DME Arranged: Tube feeding DME Agency: [employer] Date DME Agency Contacted: 10/01/19 Time DME Agency Contacted: [phone] Representative spoke with at DME Agency: [place] Interventions     Readmission Risk Interventions No flowsheet data found.

## 2019-10-01 NOTE — Progress Notes (Signed)
Patient tolerated 2 bolus tube feeds this afternoon. No nausea/vomiting/abd.distention.    Patient comfortable with discharge home this evening according to MD orders.   Paperwork reviewed. Prescriptions sent to pharmacy.    NT rolled patient down with all belongings to daughter's care.     [place], Therapist, sports

## 2019-10-01 NOTE — Progress Notes (Signed)
2 Days Post-Op   Subjective/Chief Complaint: Unfortunately she is still having dysphagia to anything beyond clear liquids- has not noted any improvement since nissen takedown 2 days ago.   Objective: Vital signs in last 24 hours: Temp:  [97.6 F (36.4 C)-98.3 F (36.8 C)] 98.3 F (36.8 C) (03/04 0609) Pulse Rate:  [69-81] 69 (03/04 0609) Resp:  [15-18] 18 (03/04 0609) BP: (105-130)/(59-60) 121/60 (03/04 0609) SpO2:  [94 %-100 %] 94 % (03/04 0609) Weight:  [57.2 kg] 57.2 kg (03/04 0500) Last BM Date: 09/29/19  Intake/Output from previous day: 03/03 0701 - 03/04 0700 In: [id number] [P.O.:1380; IV Piggyback:105] Out: 1895 [Urine:1700; Emesis/NG output:75; Drains:120] Intake/Output this shift: No intake/output data recorded.  General appearance: alert and cooperative Resp: unlabored Cardio: regular rate and rhythm GI: abdomen soft, minimally appropriately tender, incisions c/d/i no cellulitis or hematoma. G tube site clean and dry.   Lab Results:  Recent Labs    09/30/19 0416 10/01/19 0404  WBC 8.0 8.4  HGB 10.3* 9.7*  HCT 32.0* 30.3*  PLT 297 302   BMET Recent Labs    09/30/19 0416 10/01/19 0404  NA 139 142  K 3.6 3.3*  CL 105 104  CO2 27 29  GLUCOSE 109* 94  BUN 13 13  CREATININE 0.70 0.56  CALCIUM 8.2* 8.5*   PT/INR No results for input(s): LABPROT, INR in the last 72 hours. ABG No results for input(s): PHART, HCO3 in the last 72 hours.  Invalid input(s): PCO2, PO2  Studies/Results: No results found.  Anti-infectives: Anti-infectives (From admission, onward)   Start     Dose/Rate Route Frequency Ordered Stop   09/29/19 1130  ceFAZolin (ANCEF) IVPB 2g/100 mL premix     2 g 200 mL/hr over 30 Minutes Intravenous On call to O.R. 09/29/19 1115 09/29/19 1332      Assessment/Plan: s/p Procedure(s): XI ROBOTIC NISSEN TAKEDOWN WITH INSERTION OF GASTROSTOMY TUBE (N/A) Persistent dysphagia.  At this point will plan to initiate tube feeding for nutrition.  If she tolerates bolus feeds today, she would like to go home this evening and I think that is reasonable.  Will continue discussions with Dr. Silverio Decamp about the utility of further dilations in the future  LOS: 2 days    Clovis Riley 10/01/2019

## 2019-10-01 NOTE — TOC Initial Note (Addendum)
Transition of Care [facility]) - Initial/Assessment Note    Patient Details  Name: Robyn Ashley MRN: [id number] Date of Birth: [birth year]  Transition of Care [facility]) CM/SW Contact:    Lia Hopping, [place] Phone Number: 10/01/2019, 4:53 PM  Clinical Narrative:      Dietician recommendation completed.             Patient scheduled to discharge today. CSW received order for tube feedings. CSW reached out to [facility] to process the order. Per [employer] liaison Zach, the tube feedings should be delivered by this weekend after the order has been processed. The patient will need to go home with 3-5 days of supplies/ feedings just in case delivery is delayed (Discussed with RN and physician). The patient will continue her ensure supplements until then.   Physician will need to sign a order form that will be generated in the am. CSW will fax to: [phone].   Expected Discharge Plan: Home/Self Care Barriers to Discharge: No Barriers Identified   Patient Goals and CMS Choice     Choice offered to / list presented to : NA  Expected Discharge Plan and Services Expected Discharge Plan: Home/Self Care In-house Referral: NA Discharge Planning Services: CM Consult   Living arrangements for the past 2 months: Single Family Home Expected Discharge Date: 10/01/19               DME Arranged: Francesco Runner feeding DME Agency: [employer] Date DME Agency Contacted: 10/01/19 Time DME Agency Contacted: [phone] Representative spoke with at DME Agency: Thedore Mins            Prior Living Arrangements/Services Living arrangements for the past 2 months: [place] with:: Spouse, Adult Children Patient language and need for interpreter reviewed:: No Do you feel safe going back to the place where you live?: Yes      Need for Family Participation in Patient Care: Yes (Comment) Care giver support system in place?: Yes (comment)   Criminal Activity/Legal Involvement Pertinent to Current  Situation/Hospitalization: No - Comment as needed  Activities of Daily Living Home Assistive Devices/Equipment: Eyeglasses, Cane (specify quad or straight) ADL Screening (condition at time of admission) Patient's cognitive ability adequate to safely complete daily activities?: Yes Is the patient deaf or have difficulty hearing?: No Does the patient have difficulty seeing, even when wearing glasses/contacts?: No Does the patient have difficulty concentrating, remembering, or making decisions?: No Patient able to express need for assistance with ADLs?: Yes Does the patient have difficulty dressing or bathing?: No Independently performs ADLs?: Yes (appropriate for developmental age) Does the patient have difficulty walking or climbing stairs?: Yes Weakness of Legs: None Weakness of Arms/Hands: None  Permission Sought/Granted Permission sought to share information with : Case Manager                Emotional Assessment Appearance:: Appears stated age Attitude/Demeanor/Rapport: Engaged Affect (typically observed): Calm Orientation: : Oriented to Self, Oriented to Place, Oriented to  Time, Oriented to Situation Alcohol / Substance Use: Not Applicable Psych Involvement: No (comment)  Admission diagnosis:  Dysphagia [R13.10] Patient Active Problem List   Diagnosis Date Noted  . Protein-calorie malnutrition, severe 09/30/2019  . Hx of sleep apnea 02/06/2019  . Other emphysema ([place]) 02/06/2019  . Acute respiratory failure with hypoxia ([place]) 02/06/2019  . Hiatal hernia with GERD without esophagitis 01/28/2019  . GERD (gastroesophageal reflux disease) 05/07/2018  . Dysphagia 05/07/2018  . History of IBS 05/07/2018   PCP:  Redmond School, MD  Pharmacy:   [facility] [place], [address] [place] [address] Phone: [phone] Fax: [phone]     Social Determinants of Health (SDOH)  Interventions    Readmission Risk Interventions No flowsheet data found.

## 2019-10-11 DIAGNOSIS — Z23 Encounter for immunization: Secondary | ICD-10-CM | POA: Diagnosis not present

## 2019-10-14 ENCOUNTER — Ambulatory Visit: Payer: Medicare Other | Admitting: Nurse Practitioner

## 2019-10-22 ENCOUNTER — Ambulatory Visit: Payer: Medicare Other | Admitting: Gastroenterology

## 2019-10-23 DIAGNOSIS — Z1389 Encounter for screening for other disorder: Secondary | ICD-10-CM | POA: Diagnosis not present

## 2019-10-23 DIAGNOSIS — Z0001 Encounter for general adult medical examination with abnormal findings: Secondary | ICD-10-CM | POA: Diagnosis not present

## 2019-10-23 DIAGNOSIS — E063 Autoimmune thyroiditis: Secondary | ICD-10-CM | POA: Diagnosis not present

## 2019-10-23 DIAGNOSIS — Z0000 Encounter for general adult medical examination without abnormal findings: Secondary | ICD-10-CM | POA: Diagnosis not present

## 2019-10-23 DIAGNOSIS — E538 Deficiency of other specified B group vitamins: Secondary | ICD-10-CM | POA: Diagnosis not present

## 2019-10-23 DIAGNOSIS — E7849 Other hyperlipidemia: Secondary | ICD-10-CM | POA: Diagnosis not present

## 2019-10-23 DIAGNOSIS — Z6821 Body mass index (BMI) 21.0-21.9, adult: Secondary | ICD-10-CM | POA: Diagnosis not present

## 2019-10-23 DIAGNOSIS — Z23 Encounter for immunization: Secondary | ICD-10-CM | POA: Diagnosis not present

## 2019-10-23 DIAGNOSIS — F329 Major depressive disorder, single episode, unspecified: Secondary | ICD-10-CM | POA: Diagnosis not present

## 2019-10-23 DIAGNOSIS — E559 Vitamin D deficiency, unspecified: Secondary | ICD-10-CM | POA: Diagnosis not present

## 2019-10-23 DIAGNOSIS — I10 Essential (primary) hypertension: Secondary | ICD-10-CM | POA: Diagnosis not present

## 2019-11-03 ENCOUNTER — Ambulatory Visit (INDEPENDENT_AMBULATORY_CARE_PROVIDER_SITE_OTHER): Payer: Medicare Other | Admitting: Gastroenterology

## 2019-11-03 ENCOUNTER — Encounter: Payer: Self-pay | Admitting: Gastroenterology

## 2019-11-03 VITALS — BP 118/84 | HR 119 | Temp 98.7°F | Ht 61.0 in | Wt 118.2 lb

## 2019-11-03 DIAGNOSIS — E43 Unspecified severe protein-calorie malnutrition: Secondary | ICD-10-CM | POA: Diagnosis not present

## 2019-11-03 DIAGNOSIS — R1319 Other dysphagia: Secondary | ICD-10-CM

## 2019-11-03 DIAGNOSIS — Z931 Gastrostomy status: Secondary | ICD-10-CM | POA: Diagnosis not present

## 2019-11-03 DIAGNOSIS — R1310 Dysphagia, unspecified: Secondary | ICD-10-CM | POA: Diagnosis not present

## 2019-11-03 MED ORDER — HYOSCYAMINE SULFATE 0.125 MG SL SUBL: 0.1250 mg | SUBLINGUAL_TABLET | Freq: Three times a day (TID) | SUBLINGUAL | 3 refills | Status: DC | PRN

## 2019-11-03 NOTE — Patient Instructions (Signed)
We have sent the following medications to your pharmacy for you to pick up at your convenience: Levsin.  Eat small frequent meals.   Remain on a high protein, high calorie diet.

## 2019-11-03 NOTE — Progress Notes (Signed)
ANNELI KLAGES    [id number]    1951-05-13  Primary Care Physician:Fusco, Purcell Nails, MD  Referring Physician: Redmond School, MD [address] [place],  [place] [address]   Chief complaint: Dysphagia, failure to thrive  HPI: 69 year old female with history of chronic GERD s/p repair of paraesophageal hernia and Nissen fundoplication in July [id number] complicated by postop dysphagia and severe protein energy malnutrition  EGD 09/02/19: Evidence of a Nissen fundoplication was found in the cardia. The wrap appeared intact. This was traversed. A TTS dilator was passed through the scope. Dilation with an 18-19-20 mm x 8 cm CRE balloon dilator was performed to 20 mm. The dilation site was examined following endoscope reinsertion and showed mild mucosal disruption.    A esophageal manometry catheter was advanced into the stomach. Placement was confirmed by scope visualization.  Esophageal manometry 09/02/19 Evidence of EGJ outflow obstruction with poor bolus clearance likely secondary to tight Nissen fundoplication. Intact peristaltic pattern  Subsequently she had takedown of Nissen fundoplication on October 01, 2019 for persistent dysphagia, failure to thrive.  She had gastrostomy tube placement.  Initially she was not tolerating all the tube feeds but slowly she is able to tolerate better and is able to get 5 bolus feeds per day.  She has gained 3 pounds in the past 3 weeks.  Is slowly trying to increase oral intake but is not able to have adequate oral intake to maintain her calorie requirement.  Denies any nausea or vomiting or abdominal discomfort.  Bowel habits are regular.  No melena or blood per rectum.  Outpatient Encounter Medications as of 11/03/2019  Medication Sig  . acetaminophen (TYLENOL) 650 MG CR tablet Take 1,300 mg by mouth every 8 (eight) hours as needed for pain.   Marland Kitchen atorvastatin (LIPITOR) 20 MG tablet Take 20 mg by mouth at bedtime.   . Calcium Carbonate-Vitamin D  (CALCIUM 600+D PO) Take 1 tablet by mouth daily.  . cholecalciferol (VITAMIN D3) 25 MCG (1000 UT) tablet Take 1,000 Units by mouth daily.  Marland Kitchen docusate sodium (COLACE) 100 MG capsule Take 1 capsule (100 mg total) by mouth 2 (two) times daily.  . enalapril-hydrochlorothiazide (VASERETIC) 10-25 MG tablet Take 1 tablet by mouth daily.  Marland Kitchen FLUoxetine (PROZAC) 10 MG capsule Take 10 mg by mouth daily.   Marland Kitchen levothyroxine (SYNTHROID, LEVOTHROID) 50 MCG tablet Take 50 mcg by mouth daily before breakfast.   . methylphenidate (RITALIN) 20 MG tablet Take 20 mg by mouth 3 (three) times daily.  . metoprolol succinate (TOPROL-XL) 50 MG 24 hr tablet Take 50 mg by mouth at bedtime.   . Multiple Vitamin (MULTIVITAMIN) tablet Take 1 tablet by mouth daily.  . NON FORMULARY CPAP at night  . ondansetron (ZOFRAN-ODT) 4 MG disintegrating tablet Take 1 tablet (4 mg total) by mouth every 6 (six) hours as needed for nausea.  . traMADol (ULTRAM) 50 MG tablet Take 1 tablet (50 mg total) by mouth every 6 (six) hours as needed (pain not relieved by tylenol or other measures).  . valACYclovir (VALTREX) 1000 MG tablet Take 1,000 mg by mouth 2 (two) times daily as needed (outbreak).    No facility-administered encounter medications on file as of 11/03/2019.    Allergies as of 11/03/2019 - Review Complete 09/29/2019  Allergen Reaction Noted  . Tetracyclines & related Other (See Comments) 01/10/2011  . Aspirin  05/07/2018  . Norvasc [amlodipine besylate]  05/07/2018  . Nsaids  05/07/2018  .  Dexilant [dexlansoprazole] Diarrhea 08/20/2018    Past Medical History:  Diagnosis Date  . Anginal pain ([place]) 2015  . Dysrhythmia 2015   SVT  . GERD (gastroesophageal reflux disease)   . Hiatal hernia   . History of degenerative disc disease   . History of nuclear stress test 07/27/2013   in care everywhere per cardiology note by dr Donata Clay:  test normal  . Hypertension   . Hypothyroidism   . Intermittent palpitations   . MVP (mitral  valve prolapse)    per pt dx 1980s,  last echo done 2016 by dr Donata Clay [facility] cardiology in [place]) and last cardiology visit 11-02-2014 in epic;   01-27-2019 per pt episodic palpitations and takes toprol, currently followed by pcp  . Narcolepsy   . OSA (obstructive sleep apnea)    Not on CPAP  . TMJ arthralgia     Past Surgical History:  Procedure Laterality Date  . ABDOMINAL HYSTERECTOMY    . BIOPSY  06/24/2018   Procedure: BIOPSY;  Surgeon: Daneil Dolin, MD;  [facility];  Service: Endoscopy;;  (gastric) (colon)  . COLONOSCOPY N/A 06/24/2018   Procedure: COLONOSCOPY;  Surgeon: Daneil Dolin, MD;  [facility];  Service: Endoscopy;  Laterality: N/A;  1:45pm  . ESOPHAGEAL DILATION  09/02/2019   Procedure: ESOPHAGEAL DILATION;  Surgeon: Mauri Pole, MD;  [facility]: [facility];  Service: Endoscopy;;  . ESOPHAGEAL MANOMETRY N/A 09/02/2019   Procedure: ESOPHAGEAL MANOMETRY (EM);  Surgeon: Mauri Pole, MD;  [facility]: [facility];  Service: Endoscopy;  Laterality: N/A;  . ESOPHAGOGASTRODUODENOSCOPY N/A 06/24/2018   Procedure: ESOPHAGOGASTRODUODENOSCOPY (EGD);  Surgeon: Daneil Dolin, MD;  [facility];  Service: Endoscopy;  Laterality: N/A;  . ESOPHAGOGASTRODUODENOSCOPY N/A 07/14/2019   Procedure: ESOPHAGOGASTRODUODENOSCOPY (EGD);  Surgeon: Daneil Dolin, MD;  [facility];  Service: Endoscopy;  Laterality: N/A;  3:00pm  . ESOPHAGOGASTRODUODENOSCOPY (EGD) WITH PROPOFOL N/A 09/02/2019   Procedure: ESOPHAGOGASTRODUODENOSCOPY (EGD) WITH PROPOFOL;  Surgeon: Mauri Pole, MD;  [facility];  Service: Endoscopy;  Laterality: N/A;   mano probe placement  . EYE SURGERY Bilateral    rk  . MALONEY DILATION N/A 06/24/2018   Procedure: Venia Minks DILATION;  Surgeon: Daneil Dolin, MD;  [facility];  Service: Endoscopy;  Laterality: N/A;  . Venia Minks DILATION N/A 07/14/2019   Procedure: Venia Minks DILATION;  Surgeon:  Daneil Dolin, MD;  [facility];  Service: Endoscopy;  Laterality: N/A;  . POLYPECTOMY  06/24/2018   Procedure: POLYPECTOMY;  Surgeon: Daneil Dolin, MD;  [facility];  Service: Endoscopy;;  colon   . RF ablation of cervical nerves    . ROTATOR CUFF REPAIR Right 11-21-2006  dr supple @[facility]     Family History  Problem Relation Age of Onset  . Colon cancer Maternal Grandfather   . Colon cancer Paternal Grandmother     Social History   Socioeconomic History  . Marital status: Married    Spouse name: Not on file  . Number of children: Not on file  . Years of education: Not on file  . Highest education level: Not on file  Occupational History  . Not on file  Tobacco Use  . Smoking status: Former Smoker    Packs/day: 1.50    Types: Cigarettes    Start date: 1970    Quit date: 07/1997    Years since quitting: 22.2  . Smokeless tobacco: Never Used  Substance  and Sexual Activity  . Alcohol use: Not Currently    Comment: none in many years  . Drug use: Never  . Sexual activity: Not on file  Other Topics Concern  . Not on file  Social History Narrative  . Not on file   Social Determinants of Health   Financial Resource Strain:   . Difficulty of Paying Living Expenses:   Food Insecurity:   . Worried About Charity fundraiser in the Last Year:   . Arboriculturist in the Last Year:   Transportation Needs:   . Film/video editor (Medical):   Marland Kitchen Lack of Transportation (Non-Medical):   Physical Activity:   . Days of Exercise per Week:   . Minutes of Exercise per Session:   Stress:   . Feeling of Stress :   Social Connections:   . Frequency of Communication with Friends and Family:   . Frequency of Social Gatherings with Friends and Family:   . Attends Religious Services:   . Active Member of Clubs or Organizations:   . Attends Archivist Meetings:   Marland Kitchen Marital Status:   Intimate Partner Violence:   . Fear of Current or Ex-Partner:    . Emotionally Abused:   Marland Kitchen Physically Abused:   . Sexually Abused:       Review of systems: All other review of systems negative except as mentioned in the HPI.   Physical Exam: Vitals:   11/03/19 1318  BP: 118/84  Pulse: (!) 119  Temp: 98.7 F (37.1 C)   Body mass index is 22.33 kg/m. Gen:      No acute distress Abd:   Gastrostomy tube; soft, mild tenderness at surgical incision site; no palpable masses, no distension Ext:    No edema; adequate peripheral perfusion Skin:      Warm and dry; no rash Neuro: alert and oriented x 3 Psych: normal mood and affect  Data Reviewed:  Reviewed labs, radiology imaging, old records and pertinent past GI work up   Assessment and Plan/Recommendations:  69 year old female with history of chronic GERD, paraesophageal hernia s/p hernia repair and Nissen fundoplication complicated by dysphagia and severe protein energy malnutrition  S/p takedown of fundoplication and placement of gastrostomy tube  She is tolerating better p.o. intake and is also tolerating G-tube feeds Encourage patient to continue with small frequent meals, high-protein and high-calorie diet  No evidence of significant peristaltic abnormality other than esophageal gastric junction outflow obstruction on esophageal manometry  Return in 2 months or sooner if needed  This visit required 35 minutes of patient care (this includes precharting, chart review, review of results, face-to-face time used for counseling as well as treatment plan and follow-up. The patient was provided an opportunity to ask questions and all were answered. The patient agreed with the plan and demonstrated an understanding of the instructions.  Damaris Hippo , MD    CC: Redmond School, MD

## 2019-11-26 IMAGING — RF DG UGI W/ HIGH DENSITY W/O KUB
12 of 17 series · 14 of 24 positions shown · non-contrast
Comparison: 08/26/2018 esophagram

CLINICAL DATA: Dysphagia, post laparoscopic fundoplication in January 2019, unable to eat solids, only eating soup

EXAM:
UPPER GI SERIES WITH KUB
TECHNIQUE: After obtaining a scout radiograph a routine upper GI series was
performed using thin and high density barium.
FLUOROSCOPY TIME:  Fluoroscopy Time:  3 minutes 24 seconds
Radiation Exposure Index (if provided by the fluoroscopic device):
79.8 mGy
Number of Acquired Spot Images: 3 plus multiple fluoroscopic screen
captures

[Series 1: t ap supine · 0.15mm/px · 1 of 1 slices shown]
[im 1/1]
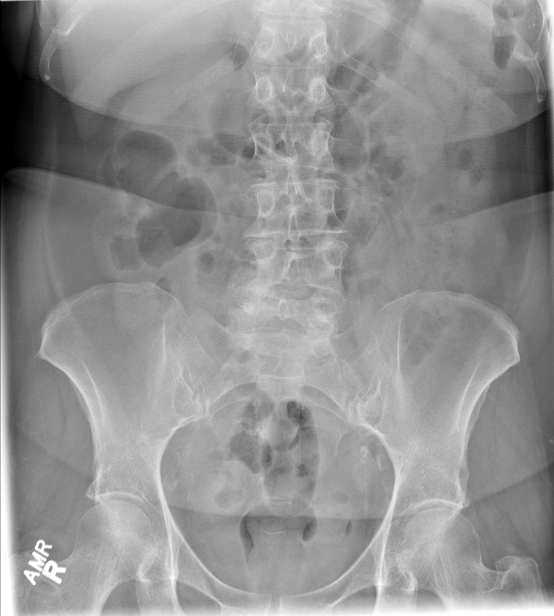

[Series 4: cp_standard · 0.17mm/px · 1 of 6 frames shown (1 of 11)]
[frame 1/6]
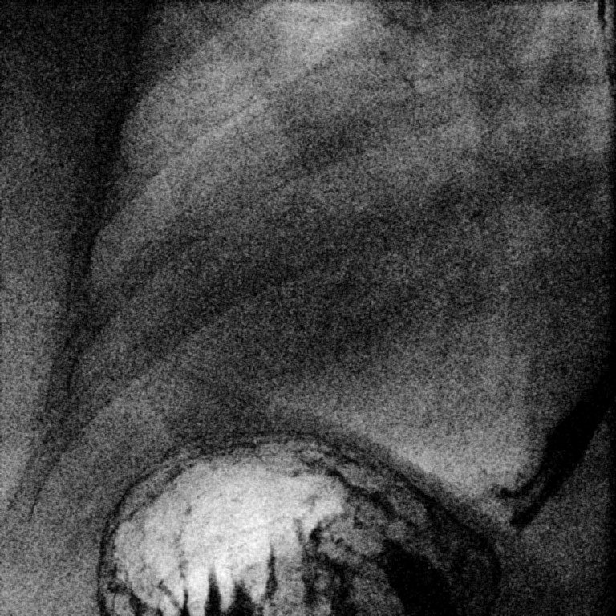

[Series 5: cp_standard · 0.17mm/px · 1 of 140 frames shown (2 of 11)]
[frame 71/140]
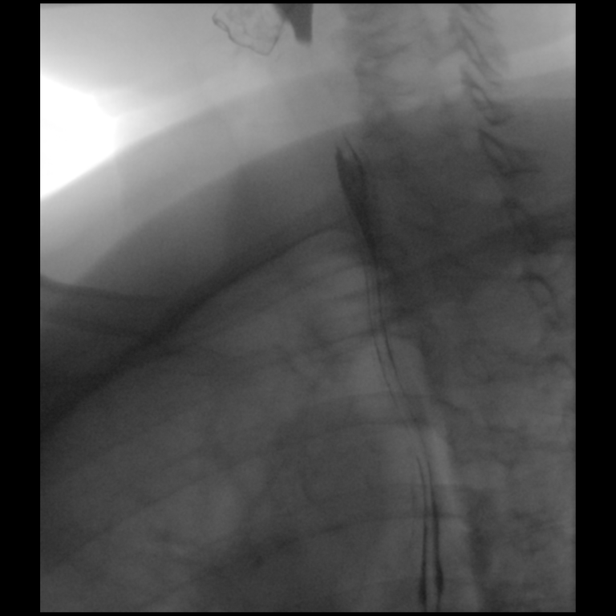

[Series 6: cp_standard · 0.17mm/px · 2 of 19 frames shown (3 of 11)]
[frame 3/19]
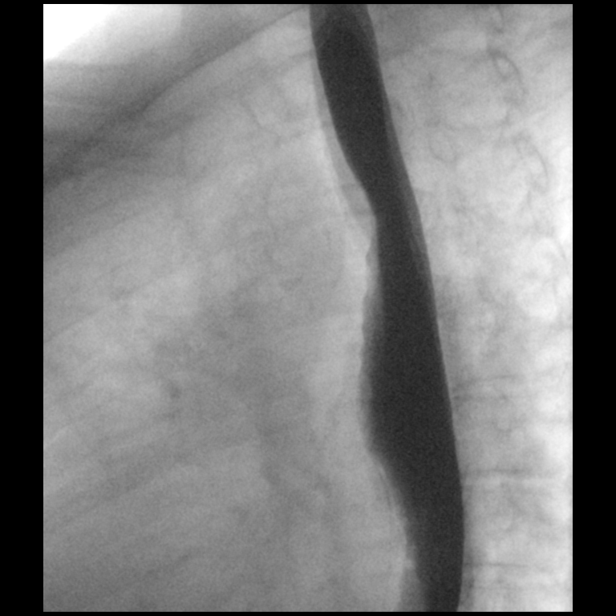
[frame 17/19]
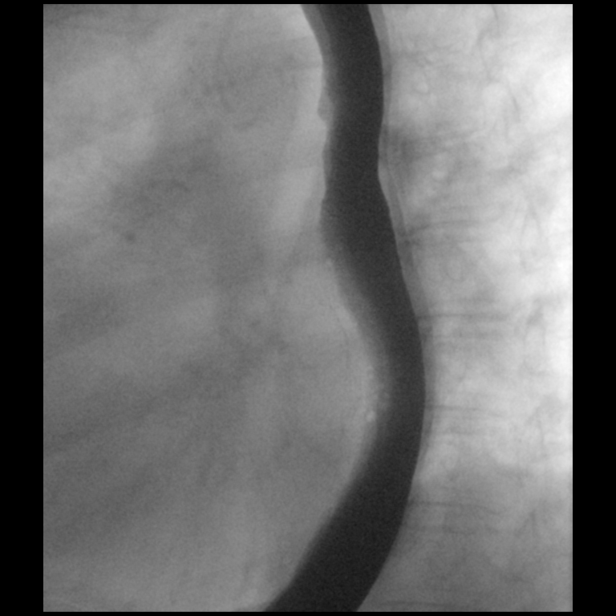

[Series 7: cp_standard · 0.17mm/px · 1 of 156 frames shown (4 of 11)]
[frame 79/156]
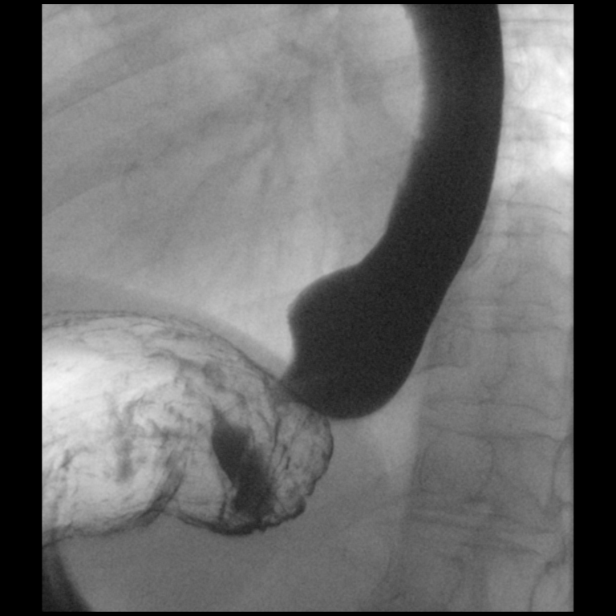

[Series 8: cp_standard · 0.18mm/px · 2 of 65 frames shown (5 of 11)]
[frame 56/65]
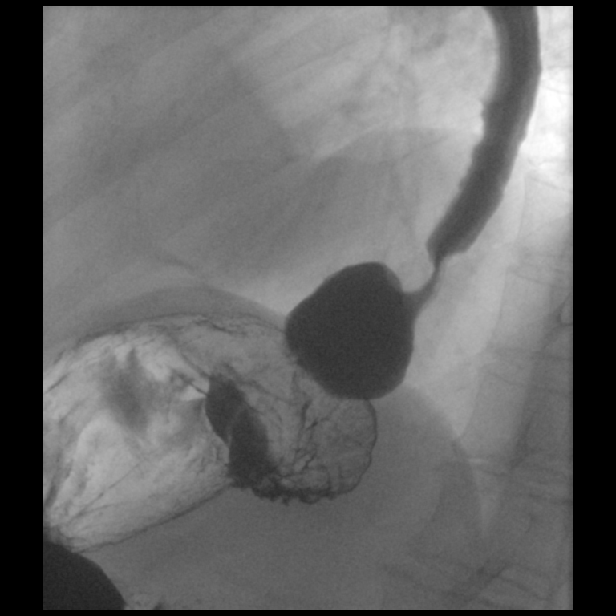
[frame 64/65]
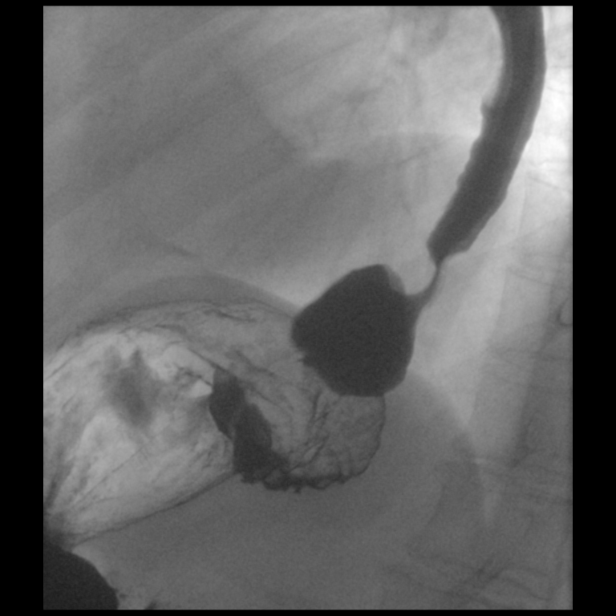

[Series 9: cp_standard · 0.18mm/px · 1 of 173 frames shown (6 of 11)]
[frame 148/173]
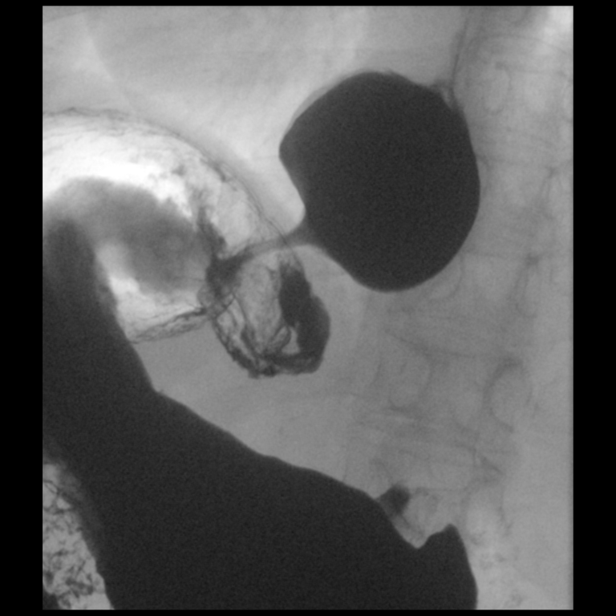

[Series 12: cp_standard · 0.18mm/px · 1 of 1 slices shown (7 of 11)]
[im 1/1]
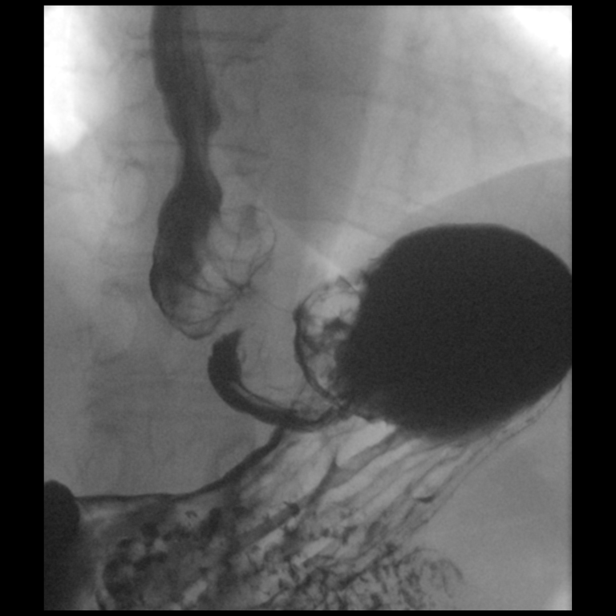

[Series 16: cp_standard · 0.18mm/px · 1 of 1 slices shown (8 of 11)]
[im 1/1]
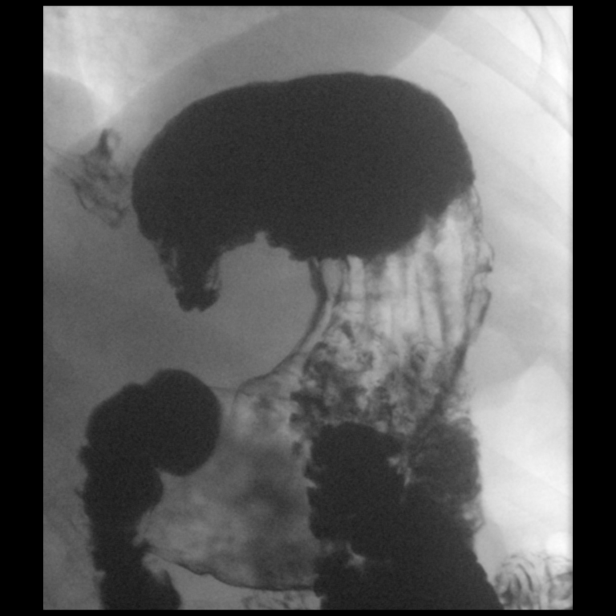

[Series 17: cp_standard · 0.18mm/px · 1 of 1 slices shown (9 of 11)]
[im 1/1]
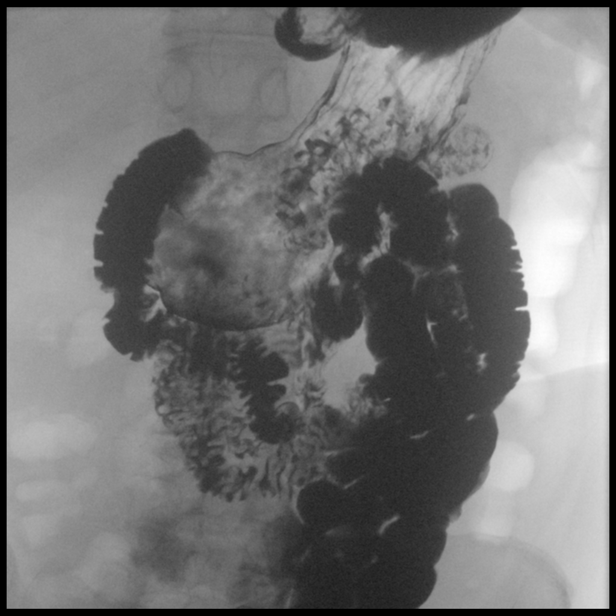

[Series 21: cp_standard · 0.19mm/px · 1 of 1 slices shown (10 of 11)]
[im 1/1]
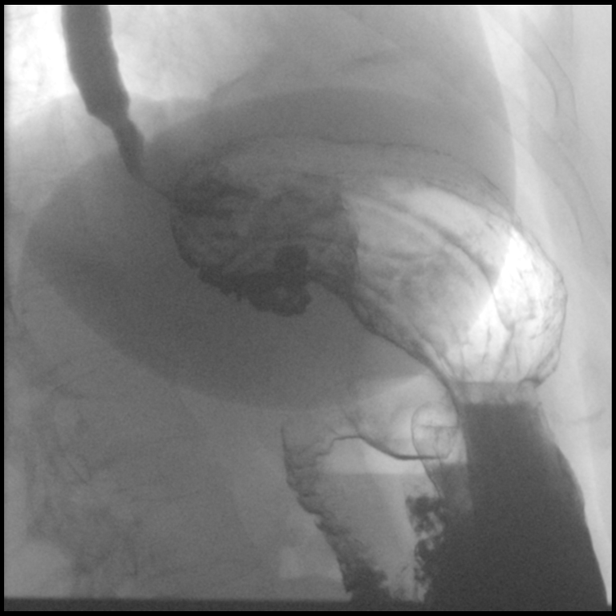

[Series 24: cp_standard · 0.20mm/px · 1 of 1 slices shown (11 of 11)]
[im 1/1]
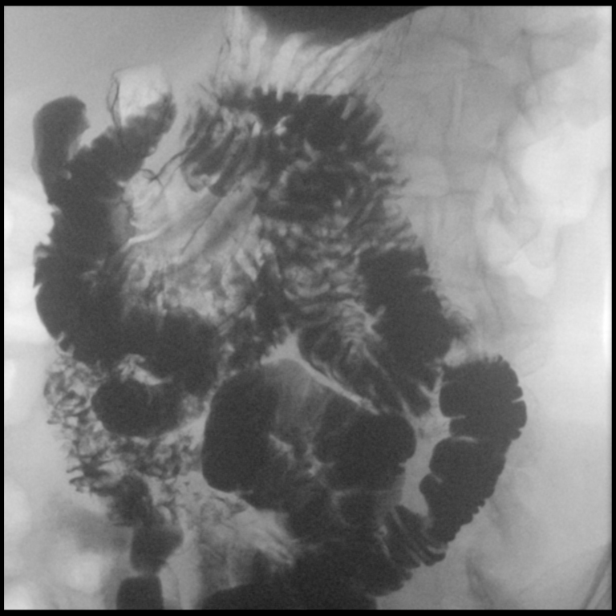

[14 of 24 positions shown; findings below may reference images not displayed]

FINDINGS: Mild dilatation of the distal esophagus.

Remainder of esophagus normal caliber.

No esophageal mass or wall irregularity.

Prominent shoulders identified at the gastroesophageal junction
consistent with intact fundoplication wrap.

Significant narrowing of the lumen of the gastroesophageal junction
consistent with tight wrap.

This is much narrower than the 12.5 mm diameter barium tablet, which
is unable to pass across.

Remainder stomach distends normally with normal rugal fold pattern.

No gastric mass, ulcer, or outlet obstruction.

Duodenal bulb and sweep normal appearance.

Visualized jejunal loops unremarkable.
IMPRESSION: Marked narrowing of GE junction consistent within intact but tight
fundoplication wrap.

Dilatation of the distal esophagus with obstruction of the 12.5 mm
diameter barium tablet at the level of the wrap.

## 2019-11-30 ENCOUNTER — Other Ambulatory Visit (HOSPITAL_COMMUNITY): Payer: Self-pay | Admitting: Surgery

## 2019-11-30 ENCOUNTER — Ambulatory Visit (HOSPITAL_COMMUNITY)
Admission: RE | Admit: 2019-11-30 | Discharge: 2019-11-30 | Disposition: A | Discharge status: Home / Self Care | Payer: Medicare Other | Source: Ambulatory Visit | Attending: Surgery | Admitting: Surgery

## 2019-11-30 ENCOUNTER — Other Ambulatory Visit: Payer: Self-pay

## 2019-11-30 DIAGNOSIS — R633 Feeding difficulties, unspecified: Secondary | ICD-10-CM

## 2019-11-30 NOTE — Progress Notes (Signed)
Patient came in with broken g tube hub.  The g tube was placed by surgery on 09/29/2019.  It functions well.  A Robyn Ashley valve was placed on the broken g tube hub.  The patient was preferred with this option over a g tube replacement.  She will call us if she has any further issues.  [facility] verified that the patient was satisfied and her issues were resolved.

## 2019-12-01 ENCOUNTER — Other Ambulatory Visit (HOSPITAL_COMMUNITY): Payer: Self-pay | Admitting: Surgery

## 2019-12-01 DIAGNOSIS — Z931 Gastrostomy status: Secondary | ICD-10-CM

## 2019-12-04 ENCOUNTER — Ambulatory Visit: Payer: Medicare Other

## 2019-12-04 ENCOUNTER — Other Ambulatory Visit: Payer: Medicare Other

## 2019-12-28 DIAGNOSIS — F329 Major depressive disorder, single episode, unspecified: Secondary | ICD-10-CM | POA: Diagnosis not present

## 2019-12-28 DIAGNOSIS — E063 Autoimmune thyroiditis: Secondary | ICD-10-CM | POA: Diagnosis not present

## 2019-12-28 DIAGNOSIS — F909 Attention-deficit hyperactivity disorder, unspecified type: Secondary | ICD-10-CM | POA: Diagnosis not present

## 2019-12-28 DIAGNOSIS — I10 Essential (primary) hypertension: Secondary | ICD-10-CM | POA: Diagnosis not present

## 2019-12-28 DIAGNOSIS — E7849 Other hyperlipidemia: Secondary | ICD-10-CM | POA: Diagnosis not present

## 2020-01-15 ENCOUNTER — Other Ambulatory Visit: Payer: Medicare Other

## 2020-01-15 ENCOUNTER — Ambulatory Visit: Payer: Medicare Other

## 2020-01-27 DIAGNOSIS — E7849 Other hyperlipidemia: Secondary | ICD-10-CM | POA: Diagnosis not present

## 2020-01-27 DIAGNOSIS — E063 Autoimmune thyroiditis: Secondary | ICD-10-CM | POA: Diagnosis not present

## 2020-01-27 DIAGNOSIS — K219 Gastro-esophageal reflux disease without esophagitis: Secondary | ICD-10-CM | POA: Diagnosis not present

## 2020-01-27 DIAGNOSIS — I10 Essential (primary) hypertension: Secondary | ICD-10-CM | POA: Diagnosis not present

## 2020-02-10 ENCOUNTER — Ambulatory Visit (INDEPENDENT_AMBULATORY_CARE_PROVIDER_SITE_OTHER): Payer: Medicare Other | Admitting: Gastroenterology

## 2020-02-10 ENCOUNTER — Encounter: Payer: Self-pay | Admitting: Gastroenterology

## 2020-02-10 VITALS — BP 104/70 | HR 68 | Ht 61.0 in | Wt 119.0 lb

## 2020-02-10 DIAGNOSIS — Z931 Gastrostomy status: Secondary | ICD-10-CM

## 2020-02-10 DIAGNOSIS — R1310 Dysphagia, unspecified: Secondary | ICD-10-CM | POA: Diagnosis not present

## 2020-02-10 DIAGNOSIS — R1319 Other dysphagia: Secondary | ICD-10-CM

## 2020-02-10 DIAGNOSIS — E43 Unspecified severe protein-calorie malnutrition: Secondary | ICD-10-CM

## 2020-02-10 NOTE — Progress Notes (Signed)
Robyn Ashley    [id number]    1951-01-31  Primary Care Physician:Fusco, Purcell Nails, MD  Referring Physician: Redmond School, MD [address] [place],  [place] [address]   Chief complaint: Dysphagia HPI: 69 year old female here for follow-up visit for dysphagia  She is overall doing better.  She continues to struggle with eating.  She is having difficulty swallowing certain foods especially if she tries to eat fast.  She is able to drink the tube feeds solution and is able to maintain her weight. Denies any nausea, vomiting, abdominal pain, melena or bright red blood per rectum   She still has the feeding tube in place  EGD 09/02/19: Evidence of a Nissen fundoplication was found in the cardia. The wrap appeared intact. This was traversed. A TTS dilator was passed through the scope. Dilation with an 18-19-20 mm x 8 cm CRE balloon dilator was performed to 20 mm. The dilation site was examined following endoscope reinsertion and showed mild mucosal disruption.    A esophageal manometry catheter was advanced into the stomach. Placement was confirmed by scope visualization.  Esophageal manometry 09/02/19 Evidence of EGJ outflow obstruction with poor bolus clearance likely secondary to tight Nissen fundoplication. Intact peristaltic pattern  Subsequently she had takedown of Nissen fundoplication on October 01, 2019 for persistent dysphagia, failure to thrive.  She had gastrostomy tube placement.      Outpatient Encounter Medications as of 02/10/2020  Medication Sig  . acetaminophen (TYLENOL) 500 MG tablet Take 500 mg by mouth every 8 (eight) hours as needed.  Marland Kitchen atorvastatin (LIPITOR) 20 MG tablet Take 20 mg by mouth at bedtime.   . cholecalciferol (VITAMIN D3) 25 MCG (1000 UT) tablet Take 1,000 Units by mouth daily.  . enalapril-hydrochlorothiazide (VASERETIC) 10-25 MG tablet Take 1 tablet by mouth daily.  Marland Kitchen FLUoxetine (PROZAC) 10 MG capsule Take 10 mg by mouth daily.    . hyoscyamine (LEVSIN SL) 0.125 MG SL tablet Place 1 tablet (0.125 mg total) under the tongue every 8 (eight) hours as needed.  Marland Kitchen levothyroxine (SYNTHROID, LEVOTHROID) 50 MCG tablet Take 50 mcg by mouth daily before breakfast.   . methylphenidate (RITALIN) 20 MG tablet Take 20 mg by mouth 3 (three) times daily.  . metoCLOPramide (REGLAN) 10 MG tablet Take 10 mg by mouth 4 (four) times daily -  before meals and at bedtime.  . metoprolol succinate (TOPROL-XL) 50 MG 24 hr tablet Take 50 mg by mouth at bedtime.   . Multiple Vitamin (MULTIVITAMIN) tablet Take 1 tablet by mouth daily.  . NON FORMULARY CPAP at night  . ondansetron (ZOFRAN-ODT) 4 MG disintegrating tablet Take 1 tablet (4 mg total) by mouth every 6 (six) hours as needed for nausea.  . traMADol (ULTRAM) 50 MG tablet Take 1 tablet (50 mg total) by mouth every 6 (six) hours as needed (pain not relieved by tylenol or other measures).  . valACYclovir (VALTREX) 1000 MG tablet Take 1,000 mg by mouth 2 (two) times daily as needed (outbreak).   . [DISCONTINUED] docusate sodium (COLACE) 100 MG capsule Take 1 capsule (100 mg total) by mouth 2 (two) times daily.   No facility-administered encounter medications on file as of 02/10/2020.    Allergies as of 02/10/2020 - Review Complete 02/10/2020  Allergen Reaction Noted  . Tetracyclines & related Other (See Comments) 01/10/2011  . Aspirin  05/07/2018  . Norvasc [amlodipine besylate]  05/07/2018  . Nsaids  05/07/2018  . Dexilant [dexlansoprazole]  Diarrhea 08/20/2018    Past Medical History:  Diagnosis Date  . Anginal pain ([place]) 2015  . Dysrhythmia 2015   SVT  . GERD (gastroesophageal reflux disease)   . Hiatal hernia   . History of degenerative disc disease   . History of nuclear stress test 07/27/2013   in care everywhere per cardiology note by dr Donata Clay:  test normal  . Hypertension   . Hypothyroidism   . Intermittent palpitations   . MVP (mitral valve prolapse)    per pt dx 1980s,   last echo done 2016 by dr Donata Clay [facility] in [place]) and last cardiology visit 11-02-2014 in epic;   01-27-2019 per pt episodic palpitations and takes toprol, currently followed by pcp  . Narcolepsy   . OSA (obstructive sleep apnea)    Not on CPAP  . TMJ arthralgia     Past Surgical History:  Procedure Laterality Date  . ABDOMINAL HYSTERECTOMY    . BIOPSY  06/24/2018   Procedure: BIOPSY;  Surgeon: Daneil Dolin, MD;  [facility];  Service: Endoscopy;;  (gastric) (colon)  . COLONOSCOPY N/A 06/24/2018   Procedure: COLONOSCOPY;  Surgeon: Daneil Dolin, MD;  [facility];  Service: Endoscopy;  Laterality: N/A;  1:45pm  . ESOPHAGEAL DILATION  09/02/2019   Procedure: ESOPHAGEAL DILATION;  Surgeon: Mauri Pole, MD;  Location: [facility];  Service: Endoscopy;;  . ESOPHAGEAL MANOMETRY N/A 09/02/2019   Procedure: ESOPHAGEAL MANOMETRY (EM);  Surgeon: Mauri Pole, MD;  [facility]: [facility];  Service: Endoscopy;  Laterality: N/A;  . ESOPHAGOGASTRODUODENOSCOPY N/A 06/24/2018   Procedure: ESOPHAGOGASTRODUODENOSCOPY (EGD);  Surgeon: Daneil Dolin, MD;  [facility];  Service: Endoscopy;  Laterality: N/A;  . ESOPHAGOGASTRODUODENOSCOPY N/A 07/14/2019   Procedure: ESOPHAGOGASTRODUODENOSCOPY (EGD);  Surgeon: Daneil Dolin, MD;  [facility];  Service: Endoscopy;  Laterality: N/A;  3:00pm  . ESOPHAGOGASTRODUODENOSCOPY (EGD) WITH PROPOFOL N/A 09/02/2019   Procedure: ESOPHAGOGASTRODUODENOSCOPY (EGD) WITH PROPOFOL;  Surgeon: Mauri Pole, MD;  [facility];  Service: Endoscopy;  Laterality: N/A;   mano probe placement  . EYE SURGERY Bilateral    rk  . MALONEY DILATION N/A 06/24/2018   Procedure: Venia Minks DILATION;  Surgeon: Daneil Dolin, MD;  [facility];  Service: Endoscopy;  Laterality: N/A;  . Venia Minks DILATION N/A 07/14/2019   Procedure: Venia Minks DILATION;  Surgeon: Daneil Dolin, MD;  [facility];  Service: Endoscopy;  Laterality: N/A;  . POLYPECTOMY  06/24/2018   Procedure: POLYPECTOMY;  Surgeon: Daneil Dolin, MD;  [facility];  Service: Endoscopy;;  colon   . RF ablation of cervical nerves    . ROTATOR CUFF REPAIR Right 11-21-2006  dr supple @[facility]     Family History  Problem Relation Age of Onset  . Colon cancer Maternal Grandfather   . Colon cancer Paternal Grandmother   . Alzheimer's disease Mother   . Heart disease Father   . COPD Father     Social History   Socioeconomic History  . Marital status: Married    Spouse name: Not on file  . Number of children: Not on file  . Years of education: Not on file  . Highest education level: Not on file  Occupational History  . Not on file  Tobacco Use  . Smoking status: Former Smoker    Packs/day: 1.50    Types: Cigarettes    Start date: 1970    Quit date: 07/1997  Years since quitting: 22.5  . Smokeless tobacco: Never Used  Vaping Use  . Vaping Use: Never used  Substance and Sexual Activity  . Alcohol use: Not Currently    Comment: none in many years  . Drug use: Never  . Sexual activity: Not on file  Other Topics Concern  . Not on file  Social History Narrative  . Not on file   Social Determinants of Health   Financial Resource Strain:   . Difficulty of Paying Living Expenses:   Food Insecurity:   . Worried About Charity fundraiser in the Last Year:   . Arboriculturist in the Last Year:   Transportation Needs:   . Film/video editor (Medical):   Marland Kitchen Lack of Transportation (Non-Medical):   Physical Activity:   . Days of Exercise per Week:   . Minutes of Exercise per Session:   Stress:   . Feeling of Stress :   Social Connections:   . Frequency of Communication with Friends and Family:   . Frequency of Social Gatherings with Friends and Family:   . Attends Religious Services:   . Active Member of Clubs or Organizations:   . Attends Archivist Meetings:   Marland Kitchen  Marital Status:   Intimate Partner Violence:   . Fear of Current or Ex-Partner:   . Emotionally Abused:   Marland Kitchen Physically Abused:   . Sexually Abused:       Review of systems:  All other review of systems negative except as mentioned in the HPI.   Physical Exam: Vitals:   02/10/20 1401  BP: 104/70  Pulse: 68   Body mass index is 22.48 kg/m. Gen:      No acute distress HEENT:  sclera anicteric Abd:      G-tube, soft, non-tender; no palpable masses, no distension Ext:    No edema Neuro: alert and oriented x 3 Psych: normal mood and affect  Data Reviewed:  Reviewed labs, radiology imaging, old records and pertinent past GI work up   Assessment and Plan/Recommendations:  69 year old very pleasant female with history of chronic GERD, paraesophageal hernia s/p hernia repair with Nissen fundoplication complicated by postop dysphagia and severe protein energy malnutrition S/p takedown of Nissen fundoplication, had gastrostomy tube placed Her oral intake is improving but she continues to have dysphagia intermittently mostly to solids  Encourage patient to increase oral intake Trial of peppermint oil and warm water before meals to help with possible esophageal spasms  Esophageal manometry did not show any significant esophageal peristaltic abnormality  If she is able to maintain her nutritional status, may be able to remove the feeding tube in the near future  Return in 3 months   The patient was provided an opportunity to ask questions and all were answered. The patient agreed with the plan and demonstrated an understanding of the instructions.  Damaris Hippo , MD    CC: Redmond School, MD

## 2020-02-10 NOTE — Patient Instructions (Signed)
You have been scheduled for a Barium Esophogram at [facility] (1st floor of the hospital) on 02/18/2020 at 9:30am. Please arrive 15 minutes prior to your appointment for registration. Make certain not to have anything to eat or drink 3 hours prior to your test. If you need to reschedule for any reason, please contact radiology at [phone] to do so. __________________________________________________________________ A barium swallow is an examination that concentrates on views of the esophagus. This tends to be a double contrast exam (barium and two liquids which, when combined, create a gas to distend the wall of the oesophagus) or single contrast (non-ionic iodine based). The study is usually tailored to your symptoms so a good history is essential. Attention is paid during the study to the form, structure and configuration of the esophagus, looking for functional disorders (such as aspiration, dysphagia, achalasia, motility and reflux) EXAMINATION You may be asked to change into a gown, depending on the type of swallow being performed. A radiologist and radiographer will perform the procedure. The radiologist will advise you of the type of contrast selected for your procedure and direct you during the exam. You will be asked to stand, sit or lie in several different positions and to hold a small amount of fluid in your mouth before being asked to swallow while the imaging is performed .In some instances you may be asked to swallow barium coated marshmallows to assess the motility of a solid food bolus. The exam can be recorded as a digital or video fluoroscopy procedure. POST PROCEDURE It will take 1-2 days for the barium to pass through your system. To facilitate this, it is important, unless otherwise directed, to increase your fluids for the next 24-48hrs and to resume your normal diet.  This test typically takes about 30 minutes to  perform. __________________________________________________________________________________  Drink warm water with meals  Use Peppermint Oil 1-2 drops in a cup of water as needed three times a day as needed  Take IBGard three times a day as needed  Follow up in 3 months  I appreciate the  opportunity to care for you  Thank You   Harl Bowie , MD

## 2020-02-15 ENCOUNTER — Encounter: Payer: Self-pay | Admitting: Gastroenterology

## 2020-02-18 ENCOUNTER — Telehealth: Payer: Self-pay | Admitting: Gastroenterology

## 2020-02-18 ENCOUNTER — Ambulatory Visit (HOSPITAL_COMMUNITY): Payer: Medicare Other

## 2020-02-18 NOTE — Telephone Encounter (Signed)
Patient called requesting to reschedule the barium swallowing test at [facility]

## 2020-02-22 ENCOUNTER — Other Ambulatory Visit: Payer: Self-pay

## 2020-02-22 NOTE — Telephone Encounter (Signed)
Rescheduled to 02/29/20 at [facility]. Arrive at 9:15 am. Patient accepts this appointment.

## 2020-02-23 DIAGNOSIS — Z6821 Body mass index (BMI) 21.0-21.9, adult: Secondary | ICD-10-CM | POA: Diagnosis not present

## 2020-02-23 DIAGNOSIS — F909 Attention-deficit hyperactivity disorder, unspecified type: Secondary | ICD-10-CM | POA: Diagnosis not present

## 2020-02-29 ENCOUNTER — Other Ambulatory Visit: Payer: Self-pay

## 2020-02-29 ENCOUNTER — Ambulatory Visit (HOSPITAL_COMMUNITY)
Admission: RE | Admit: 2020-02-29 | Discharge: 2020-02-29 | Disposition: A | Discharge status: Home / Self Care | Payer: Medicare Other | Source: Ambulatory Visit | Attending: Gastroenterology | Admitting: Gastroenterology

## 2020-02-29 ENCOUNTER — Encounter: Payer: Self-pay | Admitting: Gastroenterology

## 2020-02-29 DIAGNOSIS — R1310 Dysphagia, unspecified: Secondary | ICD-10-CM | POA: Insufficient documentation

## 2020-02-29 DIAGNOSIS — K449 Diaphragmatic hernia without obstruction or gangrene: Secondary | ICD-10-CM | POA: Diagnosis not present

## 2020-02-29 DIAGNOSIS — R1319 Other dysphagia: Secondary | ICD-10-CM

## 2020-03-03 DIAGNOSIS — R1310 Dysphagia, unspecified: Secondary | ICD-10-CM | POA: Diagnosis not present

## 2020-03-24 ENCOUNTER — Other Ambulatory Visit: Payer: Self-pay

## 2020-03-24 ENCOUNTER — Ambulatory Visit
Admission: RE | Admit: 2020-03-24 | Discharge: 2020-03-24 | Disposition: A | Discharge status: Home / Self Care | Payer: Medicare Other | Source: Ambulatory Visit | Attending: Internal Medicine | Admitting: Internal Medicine

## 2020-03-24 DIAGNOSIS — Z780 Asymptomatic menopausal state: Secondary | ICD-10-CM | POA: Diagnosis not present

## 2020-03-24 DIAGNOSIS — E2839 Other primary ovarian failure: Secondary | ICD-10-CM

## 2020-03-24 DIAGNOSIS — Z1231 Encounter for screening mammogram for malignant neoplasm of breast: Secondary | ICD-10-CM | POA: Diagnosis not present

## 2020-03-24 DIAGNOSIS — M8589 Other specified disorders of bone density and structure, multiple sites: Secondary | ICD-10-CM | POA: Diagnosis not present

## 2020-03-29 DIAGNOSIS — K219 Gastro-esophageal reflux disease without esophagitis: Secondary | ICD-10-CM | POA: Diagnosis not present

## 2020-03-29 DIAGNOSIS — F329 Major depressive disorder, single episode, unspecified: Secondary | ICD-10-CM | POA: Diagnosis not present

## 2020-03-29 DIAGNOSIS — I10 Essential (primary) hypertension: Secondary | ICD-10-CM | POA: Diagnosis not present

## 2020-03-29 DIAGNOSIS — E063 Autoimmune thyroiditis: Secondary | ICD-10-CM | POA: Diagnosis not present

## 2020-03-29 DIAGNOSIS — E7849 Other hyperlipidemia: Secondary | ICD-10-CM | POA: Diagnosis not present

## 2020-04-11 ENCOUNTER — Encounter: Payer: Self-pay | Admitting: Pulmonary Disease

## 2020-04-11 ENCOUNTER — Ambulatory Visit (AMBULATORY_SURGERY_CENTER): Payer: Self-pay | Admitting: *Deleted

## 2020-04-11 ENCOUNTER — Ambulatory Visit (INDEPENDENT_AMBULATORY_CARE_PROVIDER_SITE_OTHER): Payer: Medicare Other | Admitting: Pulmonary Disease

## 2020-04-11 ENCOUNTER — Other Ambulatory Visit: Payer: Self-pay

## 2020-04-11 VITALS — BP 120/66 | HR 65 | Temp 97.1°F | Ht 60.5 in | Wt 119.6 lb

## 2020-04-11 VITALS — Ht 60.5 in | Wt 120.0 lb

## 2020-04-11 DIAGNOSIS — G4733 Obstructive sleep apnea (adult) (pediatric): Secondary | ICD-10-CM

## 2020-04-11 DIAGNOSIS — J438 Other emphysema: Secondary | ICD-10-CM | POA: Diagnosis not present

## 2020-04-11 DIAGNOSIS — R1310 Dysphagia, unspecified: Secondary | ICD-10-CM

## 2020-04-11 NOTE — Patient Instructions (Signed)
Call if you feel like your feeling of tiredness doesn't improve after starting CPAP again  Follow up in 1 year

## 2020-04-11 NOTE — Progress Notes (Signed)
[facility]  Chief Complaint  Patient presents with  . Follow-up    has not being used CPAP d/t had a G-tube    Constitutional:  BP 120/66 (BP Location: Left Arm, Cuff Size: Normal)   Pulse 65   Temp (!) 97.1 F (36.2 C) (Other (Comment)) Comment (Src): wrist  Ht 5' 0.5" (1.537 m)   Wt 119 lb 9.6 oz (54.3 kg)   SpO2 96% Comment: Room air  BMI 22.97 kg/m   Past Medical History:  MVP, HTN, DJD, GERD with HH, TMJ, paraesophageal hernia s/p Nissan fundoplication complicated by dysphagia  Past Surgical History:  Her  has a past surgical history that includes RF ablation of cervical nerves; Abdominal hysterectomy; Rotator cuff repair (Right, 11-21-2006  dr supple @[facility] ); Colonoscopy (N/A, 06/24/2018); Esophagogastroduodenoscopy (N/A, 06/24/2018); maloney dilation (N/A, 06/24/2018); biopsy (06/24/2018); polypectomy (06/24/2018); Eye surgery (Bilateral); Esophagogastroduodenoscopy (N/A, 07/14/2019); maloney dilation (N/A, 07/14/2019); Esophageal manometry (N/A, 09/02/2019); Esophagogastroduodenoscopy (egd) with propofol (N/A, 09/02/2019); Esophageal dilation (09/02/2019); and Breast cyst aspiration.  Brief Summary:  Robyn Ashley is a 69 y.o. female former smoker with emphysema on CT imaging and obstructive sleep apnea.      Subjective:   She had feeding tube placed in March 2021.  This was removed recently.  She was concerned that her CPAP tubing would get tangled with her feeding tube and as a result hasn't used CPAP for past several months.    She has been feeling more sleepy and fatigued w/o CPAP.  She has lost about 20 lbs since I saw her last year.  She is not having cough, wheeze, or sputum.  Doesn't feel like her breathing limits her activity.  She has EGD scheduled for later this month to dilate her esophagus.  She is concerned that something happened to her swallowing after she had neck surgery several years ago.  She gets episodes  in which she is swallowing and taking a breath at the same time.  She doesn't recall ever being seen by a speech therapist. Physical Exam:   Appearance - well kempt   ENMT - no sinus tenderness, no oral exudate, no LAN, Mallampati 3 airway, no stridor  Respiratory - equal breath sounds bilaterally, no wheezing or rales  CV - s1s2 regular rate and rhythm, no murmurs  Ext - no clubbing, no edema  Skin - no rashes  Psych - normal mood and affect   Pulmonary testing:   PFT 05/26/19 >> FEV1 1.74 (79%), FEV1% 80, TLC 4.27 (99%), DLCO 82%, +BD  Chest Imaging:   CT chest 01/29/19 >> mild pneumomediastinum related to fundoplication, interstitial edema, subtle upper lobe emphysema, small b/l pleural effusions  Sleep Tests:  PSG 01/04/98 - AHI 4, SpO2 low 86%  MSLT 06/16/98 - mean sleep latency 2 minutes, 5/5 naps with sleep onset, 0 SOREM  PSG 02/19/19 >>AHI 18.8, SpO2 low 82%. REM AHI 58.4.  Auto CPAP 04/26/19 to 05/25/19 >> used on 30 of 30 nights with average 8 hrs 2 min.  Average AHI 5.6 with median CPAP 8 and 95 th percentile CPAP 12 cm H2O.  Social History:  She  reports that she quit smoking about 22 years ago. Her smoking use included cigarettes. She started smoking about 51 years ago. She smoked 1.50 packs per day. She has never used smokeless tobacco. She reports previous alcohol use. She reports that she does not use drugs.  Family History:  Her family history includes Alzheimer's disease in her  mother; Breast cancer in her maternal grandmother; COPD in her father; Colon cancer in her maternal grandfather and paternal grandmother; Heart disease in her father.     Assessment/Plan:   History of tobacco abuse with subtle changes of emphysema on CT Chest. - PFT showed bronchodilator responsiveness, but no respiratory symptoms to suggest asthma - defer inhaler therapy for now  Obstructive sleep apnea. - she was not able to use CPAP when she had a feeding tube - uses  [place] for her DME - plans to resume auto CPAP - discussed how her sleep apnea could have improved after recent weight loss - if her sleep doesn't improve after resuming CPAP, she will then call and will arrange for additional sleep testing  Dysphagia after Nissan fundoplication that required reversal. - she is followed by Dr. Silverio Decamp with [facility] - she had G tube removed recently - advised her to discuss with Dr. Silverio Decamp whether she might benefit from speech therapy assessment of her dysphagia also  Time Spent Involved in Patient Care on Day of Examination:  24 minutes  Follow up:  Patient Instructions  Call if you feel like your feeling of tiredness doesn't improve after starting CPAP again  Follow up in 1 year   Medication List:   Allergies as of 04/11/2020      Reactions   Tetracyclines & Related Other (See Comments)   Pt developed gastritis while taking tetracycline   Aspirin    bruises   Norvasc [amlodipine Besylate]    Made BP drop low   Nsaids    bruising   Dexilant [dexlansoprazole] Diarrhea      Medication List       Accurate as of April 11, 2020 11:06 AM. If you have any questions, ask your nurse or doctor.        acetaminophen 500 MG tablet Commonly known as: TYLENOL Take 500 mg by mouth every 8 (eight) hours as needed.   atorvastatin 20 MG tablet Commonly known as: LIPITOR Take 20 mg by mouth at bedtime.   cholecalciferol 25 MCG (1000 UNIT) tablet Commonly known as: VITAMIN D3 Take 1,000 Units by mouth daily.   enalapril-hydrochlorothiazide 10-25 MG tablet Commonly known as: VASERETIC Take 1 tablet by mouth daily.   FLUoxetine 10 MG capsule Commonly known as: PROZAC Take 10 mg by mouth daily.   hyoscyamine 0.125 MG SL tablet Commonly known as: LEVSIN SL Place 1 tablet (0.125 mg total) under the tongue every 8 (eight) hours as needed.   levothyroxine 50 MCG tablet Commonly known as: SYNTHROID Take 50 mcg by mouth  daily before breakfast.   methylphenidate 20 MG tablet Commonly known as: RITALIN Take 20 mg by mouth 3 (three) times daily.   metoprolol succinate 50 MG 24 hr tablet Commonly known as: TOPROL-XL Take 50 mg by mouth at bedtime.   multivitamin tablet Take 1 tablet by mouth daily.   NON FORMULARY CPAP at night   ondansetron 4 MG disintegrating tablet Commonly known as: ZOFRAN-ODT Take 1 tablet (4 mg total) by mouth every 6 (six) hours as needed for nausea.   Reglan 10 MG tablet Generic drug: metoCLOPramide Take 10 mg by mouth 4 (four) times daily -  before meals and at bedtime.   traMADol 50 MG tablet Commonly known as: ULTRAM Take 1 tablet (50 mg total) by mouth every 6 (six) hours as needed (pain not relieved by tylenol or other measures).   valACYclovir 1000 MG tablet Commonly known as: VALTREX Take 1,000  mg by mouth 2 (two) times daily as needed (outbreak).       Signature:  Chesley Mires, MD [place] Pager - [phone] 04/11/2020, 11:06 AM

## 2020-04-11 NOTE — Progress Notes (Signed)

## 2020-04-26 ENCOUNTER — Other Ambulatory Visit: Payer: Self-pay

## 2020-04-26 ENCOUNTER — Encounter: Payer: Self-pay | Admitting: Gastroenterology

## 2020-04-26 ENCOUNTER — Ambulatory Visit (AMBULATORY_SURGERY_CENTER): Payer: Medicare Other | Admitting: Gastroenterology

## 2020-04-26 VITALS — BP 116/61 | HR 66 | Temp 98.2°F | Resp 12 | Ht 61.0 in | Wt 120.0 lb

## 2020-04-26 DIAGNOSIS — T18128A Food in esophagus causing other injury, initial encounter: Secondary | ICD-10-CM | POA: Diagnosis not present

## 2020-04-26 DIAGNOSIS — K222 Esophageal obstruction: Secondary | ICD-10-CM | POA: Diagnosis not present

## 2020-04-26 DIAGNOSIS — R1310 Dysphagia, unspecified: Secondary | ICD-10-CM | POA: Diagnosis not present

## 2020-04-26 DIAGNOSIS — R1319 Other dysphagia: Secondary | ICD-10-CM

## 2020-04-26 DIAGNOSIS — K2100 Gastro-esophageal reflux disease with esophagitis, without bleeding: Secondary | ICD-10-CM | POA: Diagnosis not present

## 2020-04-26 MED ORDER — SODIUM CHLORIDE 0.9 % IV SOLN: 500.0000 mL | Freq: Once | INTRAVENOUS | Status: DC

## 2020-04-26 NOTE — Progress Notes (Signed)
pt tolerated well. VSS. awake and to recovery. Report given to RN. Bite block inserted and removed without trauma. 

## 2020-04-26 NOTE — Progress Notes (Signed)
Called to room to assist during endoscopic procedure.  Patient ID and intended procedure confirmed with present staff. Received instructions for my participation in the procedure from the performing physician.  

## 2020-04-26 NOTE — Patient Instructions (Addendum)
hank you for letting us take care of your healthcare needs today. Please see post Dilation diet sheet and Esophagitis. No NSaids Ibuprofen, Aspirin or motrin. Please make a follow up appointment with Dr. Silverio Decamp as soon as possible.      YOU HAD AN ENDOSCOPIC PROCEDURE TODAY AT [facility]:   Refer to the procedure report that was given to you for any specific questions about what was found during the examination.  If the procedure report does not answer your questions, please call your gastroenterologist to clarify.  If you requested that your care partner not be given the details of your procedure findings, then the procedure report has been included in a sealed envelope for you to review at your convenience later.  YOU SHOULD EXPECT: Some feelings of bloating in the abdomen. Passage of more gas than usual.  Walking can help get rid of the air that was put into your GI tract during the procedure and reduce the bloating. If you had a lower endoscopy (such as a colonoscopy or flexible sigmoidoscopy) you may notice spotting of blood in your stool or on the toilet paper. If you underwent a bowel prep for your procedure, you may not have a normal bowel movement for a few days.  Please Note:  You might notice some irritation and congestion in your nose or some drainage.  This is from the oxygen used during your procedure.  There is no need for concern and it should clear up in a day or so.  SYMPTOMS TO REPORT IMMEDIATELY:    Following upper endoscopy (EGD)  Vomiting of blood or coffee ground material  New chest pain or pain under the shoulder blades  Painful or persistently difficult swallowing  New shortness of breath  Fever of 100F or higher  Black, tarry-looking stools  For urgent or emergent issues, a gastroenterologist can be reached at any hour by calling [phone]. Do not use MyChart messaging for urgent concerns.    DIET:  Clear liquid diet only today to  start at noon. Tomorrow you may advance to a Soft diet and then a  regular diet as tolerated.  Drink plenty of fluids but you should avoid alcoholic beverages for 24 hours.  ACTIVITY:  You should plan to take it easy for the rest of today and you should NOT DRIVE or use heavy machinery until tomorrow (because of the sedation medicines used during the test).    FOLLOW UP: Our staff will call the number listed on your records 48-72 hours following your procedure to check on you and address any questions or concerns that you may have regarding the information given to you following your procedure. If we do not reach you, we will leave a message.  We will attempt to reach you two times.  During this call, we will ask if you have developed any symptoms of COVID 19. If you develop any symptoms (ie: fever, flu-like symptoms, shortness of breath, cough etc.) before then, please call [phone].  If you test positive for Covid 19 in the 2 weeks post procedure, please call and report this information to [place].    If any biopsies were taken you will be contacted by phone or by letter within the next 1-3 weeks.  Please call us at [phone] if you have not heard about the biopsies in 3 weeks.    SIGNATURES/CONFIDENTIALITY: You and/or your care partner have signed paperwork which will be entered into your electronic medical record.  These signatures attest to the fact that that the information above on your After Visit Summary has been reviewed and is understood.  Full responsibility of the confidentiality of this discharge information lies with you and/or your care-partner.

## 2020-04-26 NOTE — Op Note (Addendum)
[place] Patient Name: Robyn Ashley Procedure Date: 04/26/2020 10:18 AM MRN: [id number] Endoscopist: Mauri Pole , MD Age: 69 Referring MD:  Date of Birth: Jul 30, 1951 Gender: Female Account #: [id number] Procedure:                Upper GI endoscopy Indications:              Dysphagia Medicines:                Monitored Anesthesia Care Procedure:                Pre-Anesthesia Assessment:                           - Prior to the procedure, a History and Physical                            was performed, and patient medications and                            allergies were reviewed. The patient's tolerance of                            previous anesthesia was also reviewed. The risks                            and benefits of the procedure and the sedation                            options and risks were discussed with the patient.                            All questions were answered, and informed consent                            was obtained. Prior Anticoagulants: The patient has                            taken no previous anticoagulant or antiplatelet                            agents. ASA Grade Assessment: III - A patient with                            severe systemic disease. After reviewing the risks                            and benefits, the patient was deemed in                            satisfactory condition to undergo the procedure.                           After obtaining informed consent, the endoscope was  passed under direct vision. Throughout the                            procedure, the patient's blood pressure, pulse, and                            oxygen saturations were monitored continuously. The                            Endoscope was introduced through the mouth, and                            advanced to the second part of duodenum. The upper                            GI endoscopy was accomplished without  difficulty.                            The patient tolerated the procedure well. Scope In: Scope Out: Findings:                 Food was found in the lower third of the esophagus.                            Removal of food was accomplished with roth net.                           The lumen of the esophagus was moderately dilated.                           LA Grade C (one or more mucosal breaks continuous                            between tops of 2 or more mucosal folds, less than                            75% circumference) esophagitis with no bleeding was                            found 34 to 36 cm from the incisors.                           A benign-appearing, intrinsic moderate stenosis was                            found at the gastroesophageal junction/cardia site                            of prior Nissen fundoplication. This was traversed.                            A TTS dilator was passed through the scope.  Dilation with an 18-19-20 mm balloon dilator was                            performed to 20 mm. The dilation site was examined                            following endoscope reinsertion and showed mild                            mucosal disruption.                           The exam was otherwise without abnormality. Complications:            No immediate complications. Estimated Blood Loss:     Estimated blood loss was minimal. Impression:               - Food in the lower third of the esophagus. Removal                            was successful.                           - Dilation in the entire esophagus.                           - LA Grade C reflux esophagitis with no bleeding.                           - Gastric stenosis was found at the                            gastroesophageal junction. Dilated.                           - The examination was otherwise normal. Recommendation:           - Patient has a contact number available for                             emergencies. The signs and symptoms of potential                            delayed complications were discussed with the                            patient. Return to normal activities tomorrow.                            Written discharge instructions were provided to the                            patient.                           - Clear liquid diet today, then advance as  tolerated to soft diet.                           - Continue present medications.                           - No aspirin, ibuprofen, naproxen, or other                            non-steroidal anti-inflammatory drugs.                           - Return to GI office at the next available                            appointment. Please call to schedule appointment. Mauri Pole, MD 04/26/2020 10:58:48 AM This report has been signed electronically.

## 2020-04-26 NOTE — Progress Notes (Signed)
Vital signs checked by:SF  The patient states no changes in medical or surgical history since pre-visit screening on 04/11/20.

## 2020-04-28 ENCOUNTER — Telehealth: Payer: Self-pay | Admitting: *Deleted

## 2020-04-28 NOTE — Telephone Encounter (Signed)
°  Follow up Call-  Call back number 04/26/2020  Post procedure Call Back phone  # [phone]  Permission to leave phone message Yes  Some recent data might be hidden     Patient questions:  Do you have a fever, pain , or abdominal swelling? No. Pain Score  0 *  Have you tolerated food without any problems? No.- pt reports she is still having difficulty swallowing foods. "It feels like it gets hung up and won't go down. I tried to eat a sandwich last night and gave up after three bites because it felt like it just wouldn't go down." Pt states she is swallowing liquids fine. Instructed pt to continue to monitor swallowing over the next few days and if it is not improved to please call Dr. Woodward Ku office to make her aware. Pt has an appointment in December to see Dr. Silverio Decamp. RN instructed pt that she would make Dr. Silverio Decamp aware of her continued difficulty swallowing as reported from f/u phone call and that our office would call if there were any further recommendations but for the pt to call the office if any other problems or concerns arise. Pt agreeable to plan of care.   Have you been able to return to your normal activities? Yes.    Do you have any questions about your discharge instructions: Diet   No. Medications  No. Follow up visit  No.  Do you have questions or concerns about your Care? No.  Actions: * If pain score is 4 or above: Physician/ provider Notified : Harl Bowie, MD   1. Have you developed a fever since your procedure? no  2.   Have you had an respiratory symptoms (SOB or cough) since your procedure? no  3.   Have you tested positive for COVID 19 since your procedure no  4.   Have you had any family members/close contacts diagnosed with the COVID 19 since your procedure?  no   If yes to any of these questions please route to Joylene John, RN and Joella Prince, Therapist, sports .

## 2020-04-28 NOTE — Telephone Encounter (Signed)
Left message on machine to call back  10/5 appt spot saved for pt.

## 2020-04-28 NOTE — Telephone Encounter (Signed)
The pt returned call and has accepted the 10/5 appt.  She verbalized understanding of the diet recommendations.

## 2020-04-28 NOTE — Telephone Encounter (Signed)
Beth, can you please check if we can bring her in for an earlier appointment to discuss other options. She will need to slowly advance diet but if is having trouble with regular diet, may need to stay on full liquid/soft diet.

## 2020-05-03 ENCOUNTER — Encounter: Payer: Self-pay | Admitting: Gastroenterology

## 2020-05-03 ENCOUNTER — Ambulatory Visit (INDEPENDENT_AMBULATORY_CARE_PROVIDER_SITE_OTHER): Payer: Medicare Other | Admitting: Gastroenterology

## 2020-05-03 ENCOUNTER — Other Ambulatory Visit (INDEPENDENT_AMBULATORY_CARE_PROVIDER_SITE_OTHER): Payer: Medicare Other

## 2020-05-03 VITALS — BP 110/60 | HR 73 | Ht 61.5 in | Wt 119.0 lb

## 2020-05-03 DIAGNOSIS — E43 Unspecified severe protein-calorie malnutrition: Secondary | ICD-10-CM | POA: Diagnosis not present

## 2020-05-03 DIAGNOSIS — Z931 Gastrostomy status: Secondary | ICD-10-CM | POA: Diagnosis not present

## 2020-05-03 DIAGNOSIS — K220 Achalasia of cardia: Secondary | ICD-10-CM

## 2020-05-03 DIAGNOSIS — R1310 Dysphagia, unspecified: Secondary | ICD-10-CM

## 2020-05-03 DIAGNOSIS — R627 Adult failure to thrive: Secondary | ICD-10-CM

## 2020-05-03 DIAGNOSIS — K9189 Other postprocedural complications and disorders of digestive system: Secondary | ICD-10-CM

## 2020-05-03 LAB — COMPREHENSIVE METABOLIC PANEL
ALT: 19 U/L (ref 0–35)
AST: 25 U/L (ref 0–37)
Albumin: 4.4 g/dL (ref 3.5–5.2)
Alkaline Phosphatase: 93 U/L (ref 39–117)
BUN: 19 mg/dL (ref 6–23)
CO2: 34 mEq/L — ABNORMAL HIGH (ref 19–32)
Calcium: 9.4 mg/dL (ref 8.4–10.5)
Chloride: 97 mEq/L (ref 96–112)
Creatinine, Ser: 0.67 mg/dL (ref 0.40–1.20)
GFR: 90.85 mL/min (ref >60.00)
Glucose, Bld: 88 mg/dL (ref 70–99)
Potassium: 3.9 mEq/L (ref 3.5–5.1)
Sodium: 137 mEq/L (ref 135–145)
Total Bilirubin: 0.4 mg/dL (ref 0.2–1.2)
Total Protein: 7.6 g/dL (ref 6.0–8.3)

## 2020-05-03 NOTE — Patient Instructions (Addendum)
If you are age 67 or older, your body mass index should be between 23-30. Your Body mass index is 22.12 kg/m. If this is out of the aforementioned range listed, please consider follow up with your Primary Care Provider.  If you are age 56 or younger, your body mass index should be between 19-25. Your Body mass index is 22.12 kg/m. If this is out of the aformentioned range listed, please consider follow up with your Primary Care Provider.   Go to the basement for labs today    You have a CT scan of your chest scheduled at [facility] on 05/12/2020 at 8:30am , Liquids only 4 hours prior to test   Follow up with Dr Windle Guard for feeding tube   I appreciate the  opportunity to care for you  Thank You   Harl Bowie , MD

## 2020-05-03 NOTE — Progress Notes (Addendum)
Robyn Ashley    [id number]    1951-06-07  Primary Care Physician:Fusco, Purcell Nails, MD  Referring Physician: Redmond School, [place],  [place] [address]   Chief complaint: Dysphagia  HPI:  69 year old very pleasant female here for follow-up visit for dysphagia  No significant improvement in swallowing after EGD with dilation.  She is having difficulty even getting liquids down, feels the fluid buildup in the back of her throat when she is trying to even drink her protein shake.  She has constant mucus in the back of her throat.  She is losing weight  Had C-spine laser procedure, unclear if her C-spine procedure is exacerbating esophageal dysmotility  EGD April 26, 2020: Retained food bezoar in the esophagus is post removal with right neck, LA grade C esophagitis, mucosal ring , hiatal hernia and long stricture at the site of previous Nissen fundoplication, dilated with TTS balloon to 20 mm with mild mucosal disruption  Barium esophagram February 29, 2020: Significant esophageal dysmotility with moderate esophageal stasis, hiatal hernia with wide patent lower esophageal mucosal ring, fairly tight long stricture in the region of previous Nissen fundoplication, barium pill would not pass through the area  EGD 09/02/19:Evidence of a Nissen fundoplication was found in the cardia. The wrap appeared intact. This was traversed. A TTS dilator was passed through the scope. Dilation with an 18-19-20 mm x 8 cm CRE balloon dilator was performed to 20 mm. The dilation site was examined following endoscope reinsertion and showed mild mucosal disruption.   A esophageal manometry catheter was advanced into the stomach. Placement was confirmed by scope visualization.  Esophageal manometry 09/02/19 Evidence of EGJ outflow obstructionwith poor bolus clearance likely secondary to tight Nissen fundoplication. Intact peristaltic pattern  Subsequently she had takedown of  Nissen fundoplication on October 01, 2019 for persistent dysphagia, failure to thrive. She had gastrostomy tube placement.     Outpatient Encounter Medications as of 05/03/2020  Medication Sig  . acetaminophen (TYLENOL) 500 MG tablet Take 500 mg by mouth every 8 (eight) hours as needed.  Marland Kitchen atorvastatin (LIPITOR) 20 MG tablet Take 20 mg by mouth at bedtime.   . cholecalciferol (VITAMIN D3) 25 MCG (1000 UT) tablet Take 1,000 Units by mouth daily.  . enalapril-hydrochlorothiazide (VASERETIC) 10-25 MG tablet Take 1 tablet by mouth daily.  Marland Kitchen FLUoxetine (PROZAC) 10 MG capsule Take 10 mg by mouth daily.   Marland Kitchen levothyroxine (SYNTHROID, LEVOTHROID) 50 MCG tablet Take 50 mcg by mouth daily before breakfast.   . methylphenidate (RITALIN) 20 MG tablet Take 20 mg by mouth 3 (three) times daily.  . metoCLOPramide (REGLAN) 10 MG tablet Take 10 mg by mouth 4 (four) times daily -  before meals and at bedtime.  . metoprolol succinate (TOPROL-XL) 50 MG 24 hr tablet Take 50 mg by mouth at bedtime.   . Multiple Vitamin (MULTIVITAMIN) tablet Take 1 tablet by mouth daily.  . NON FORMULARY CPAP at night  . valACYclovir (VALTREX) 1000 MG tablet Take 1,000 mg by mouth 2 (two) times daily as needed (outbreak).    No facility-administered encounter medications on file as of 05/03/2020.    Allergies as of 05/03/2020 - Review Complete 04/26/2020  Allergen Reaction Noted  . Tetracyclines & related Other (See Comments) 01/10/2011  . Aspirin  05/07/2018  . Norvasc [amlodipine besylate]  05/07/2018  . Nsaids  05/07/2018  . Dexilant [dexlansoprazole] Diarrhea 08/20/2018    Past Medical History:  Diagnosis  Date  . Allergy   . Anemia   . Anginal pain ([place]) 2015  . Depression   . Dysrhythmia 2015   SVT  . GERD (gastroesophageal reflux disease)   . Hiatal hernia   . History of degenerative disc disease   . History of nuclear stress test 07/27/2013   in care everywhere per cardiology note by dr Donata Clay:  test normal  .  Hyperlipidemia   . Hypertension   . Hypothyroidism   . Intermittent palpitations   . MVP (mitral valve prolapse)    per pt dx 1980s,  last echo done 2016 by dr Donata Clay [facility] cardi[facility] in [place]) and last cardiology visit 11-02-2014 in epic;   01-27-2019 per pt episodic palpitations and takes toprol, currently followed by pcp  . Narcolepsy   . OSA (obstructive sleep apnea)    Not on CPAP  . Osteoporosis    osteopenia  . Sleep apnea   . TMJ arthralgia     Past Surgical History:  Procedure Laterality Date  . ABDOMINAL HYSTERECTOMY    . BIOPSY  06/24/2018   Procedure: BIOPSY;  Surgeon: Daneil Dolin, MD;  [facility]: [facility];  Service: Endoscopy;;  (gastric) (colon)  . BREAST CYST ASPIRATION    . COLONOSCOPY N/A 06/24/2018   Procedure: COLONOSCOPY;  Surgeon: Daneil Dolin, MD;  Location: [facility];  Service: Endoscopy;  Laterality: N/A;  1:45pm  . ESOPHAGEAL DILATION  09/02/2019   Procedure: ESOPHAGEAL DILATION;  Surgeon: Mauri Pole, MD;  Location: [facility];  Service: Endoscopy;;  . ESOPHAGEAL MANOMETRY N/A 09/02/2019   Procedure: ESOPHAGEAL MANOMETRY (EM);  Surgeon: Mauri Pole, MD;  Location: [facility];  Service: Endoscopy;  Laterality: N/A;  . ESOPHAGOGASTRODUODENOSCOPY N/A 06/24/2018   Procedure: ESOPHAGOGASTRODUODENOSCOPY (EGD);  Surgeon: Daneil Dolin, MD;  [facility];  Service: Endoscopy;  Laterality: N/A;  . ESOPHAGOGASTRODUODENOSCOPY N/A 07/14/2019   Procedure: ESOPHAGOGASTRODUODENOSCOPY (EGD);  Surgeon: Daneil Dolin, MD;  [facility];  Service: Endoscopy;  Laterality: N/A;  3:00pm  . ESOPHAGOGASTRODUODENOSCOPY (EGD) WITH PROPOFOL N/A 09/02/2019   Procedure: ESOPHAGOGASTRODUODENOSCOPY (EGD) WITH PROPOFOL;  Surgeon: Mauri Pole, MD;  Location: [facility];  Service: Endoscopy;  Laterality: N/A;   mano probe placement  . EYE SURGERY Bilateral    rk  . MALONEY DILATION N/A 06/24/2018   Procedure: Venia Minks  DILATION;  Surgeon: Daneil Dolin, MD;  [facility];  Service: Endoscopy;  Laterality: N/A;  . Venia Minks DILATION N/A 07/14/2019   Procedure: Venia Minks DILATION;  Surgeon: Daneil Dolin, MD;  [facility];  Service: Endoscopy;  Laterality: N/A;  . NISSEN FUNDOPLICATION  45/8099   takedown  . POLYPECTOMY  06/24/2018   Procedure: POLYPECTOMY;  Surgeon: Daneil Dolin, MD;  [facility];  Service: Endoscopy;;  colon   . RF ablation of cervical nerves    . ROTATOR CUFF REPAIR Right 11-21-2006  dr supple @[facility]     Family History  Problem Relation Age of Onset  . Colon cancer Maternal Grandfather   . Colon cancer Paternal Grandmother   . Alzheimer's disease Mother   . Heart disease Father   . COPD Father   . Breast cancer Maternal Grandmother   . Liver cancer Brother   . Colon polyps Neg Hx   . Stomach cancer Neg Hx   . Esophageal cancer Neg Hx   . Rectal cancer Neg Hx     Social History   Socioeconomic History  .  Marital status: Married    Spouse name: Not on file  . Number of children: Not on file  . Years of education: Not on file  . Highest education level: Not on file  Occupational History  . Not on file  Tobacco Use  . Smoking status: Former Smoker    Packs/day: 1.50    Types: Cigarettes    Start date: 1970    Quit date: 07/1997    Years since quitting: 22.7  . Smokeless tobacco: Never Used  Vaping Use  . Vaping Use: Never used  Substance and Sexual Activity  . Alcohol use: Not Currently    Comment: none in many years  . Drug use: Never  . Sexual activity: Not on file  Other Topics Concern  . Not on file  Social History Narrative  . Not on file   Social Determinants of Health   Financial Resource Strain:   . Difficulty of Paying Living Expenses: Not on file  Food Insecurity:   . Worried About Charity fundraiser in the Last Year: Not on file  . Ran Out of Food in the Last Year: Not on file  Transportation Needs:   .  Lack of Transportation (Medical): Not on file  . Lack of Transportation (Non-Medical): Not on file  Physical Activity:   . Days of Exercise per Week: Not on file  . Minutes of Exercise per Session: Not on file  Stress:   . Feeling of Stress : Not on file  Social Connections:   . Frequency of Communication with Friends and Family: Not on file  . Frequency of Social Gatherings with Friends and Family: Not on file  . Attends Religious Services: Not on file  . Active Member of Clubs or Organizations: Not on file  . Attends Archivist Meetings: Not on file  . Marital Status: Not on file  Intimate Partner Violence:   . Fear of Current or Ex-Partner: Not on file  . Emotionally Abused: Not on file  . Physically Abused: Not on file  . Sexually Abused: Not on file      Review of systems: All other review of systems negative except as mentioned in the HPI.   Physical Exam: Vitals:   05/03/20 0931  BP: 110/60  Pulse: 73   Body mass index is 22.12 kg/m. Gen:      No acute distress HEENT:  sclera anicteric Neuro: alert and oriented x 3 Psych: normal mood and affect  Data Reviewed:  Reviewed labs, radiology imaging, old records and pertinent past GI work up   Assessment and Plan/Recommendations:  69 year old very pleasant female with history of hiatal hernia s/p laparoscopic Nissen fundoplication and repair of hiatal hernia in July 2620, complicated by postop dysphagia s/p takedown of the Nissen fundoplication with persistent dysphagia  She has tight stricture in the area of prior Nissen fundoplication, noted on barium swallow and also during EGD.  ? scar tissue or persistent partial wrap with recurrent hernia through postoperative hiatus causing the EG junction outflow obstruction.  She had retained food bezoar in the esophagus noted during the EGD, s/p 20 mm balloon dilation with persistent significant dysphagia.  Esophageal manometry showed intact esophageal  peristalsis with EGJ outflow obstruction  Obtain CT chest with IV contrast to better understand the anatomy  Pneumatic balloon dilation or POEM may not be beneficial in this scenario I will discuss with Dr. Windle Guard and Dr. Kipp Brood to see what the surgical options are  She is  failing to thrive with severe protein energy malnutrition Not even able to get adequate liquids to maintain her nutritional status She wants to have the feeding tube placed, will arrange for patient to follow-up with Dr. Windle Guard soon to get the feeding tube placement and restart enteral feeding  Return in 4 to 6 weeks   This visit required >40 minutes of patient care (this includes precharting, chart review, review of results, face-to-face time used for counseling as well as treatment plan and follow-up. The patient was provided an opportunity to ask questions and all were answered. The patient agreed with the plan and demonstrated an understanding of the instructions.  Damaris Hippo , MD    CC: Redmond School, MD

## 2020-05-05 ENCOUNTER — Other Ambulatory Visit (HOSPITAL_COMMUNITY): Payer: Self-pay | Admitting: Surgery

## 2020-05-05 ENCOUNTER — Ambulatory Visit (HOSPITAL_COMMUNITY)
Admission: RE | Admit: 2020-05-05 | Discharge: 2020-05-05 | Disposition: A | Discharge status: Home / Self Care | Payer: Medicare Other | Source: Ambulatory Visit | Attending: Surgery | Admitting: Surgery

## 2020-05-05 ENCOUNTER — Other Ambulatory Visit: Payer: Self-pay

## 2020-05-05 ENCOUNTER — Ambulatory Visit (HOSPITAL_COMMUNITY)
Admission: RE | Admit: 2020-05-05 | Discharge: 2020-05-05 | Disposition: A | Discharge status: Home / Self Care | Payer: Medicare Other | Source: Ambulatory Visit

## 2020-05-05 DIAGNOSIS — R1310 Dysphagia, unspecified: Secondary | ICD-10-CM | POA: Diagnosis not present

## 2020-05-05 DIAGNOSIS — R627 Adult failure to thrive: Secondary | ICD-10-CM | POA: Insufficient documentation

## 2020-05-05 DIAGNOSIS — I700 Atherosclerosis of aorta: Secondary | ICD-10-CM | POA: Diagnosis not present

## 2020-05-05 DIAGNOSIS — J439 Emphysema, unspecified: Secondary | ICD-10-CM | POA: Diagnosis not present

## 2020-05-05 DIAGNOSIS — Z931 Gastrostomy status: Secondary | ICD-10-CM | POA: Diagnosis not present

## 2020-05-05 DIAGNOSIS — K219 Gastro-esophageal reflux disease without esophagitis: Secondary | ICD-10-CM

## 2020-05-05 DIAGNOSIS — E43 Unspecified severe protein-calorie malnutrition: Secondary | ICD-10-CM

## 2020-05-05 DIAGNOSIS — K449 Diaphragmatic hernia without obstruction or gangrene: Secondary | ICD-10-CM | POA: Insufficient documentation

## 2020-05-05 DIAGNOSIS — K220 Achalasia of cardia: Secondary | ICD-10-CM

## 2020-05-05 DIAGNOSIS — K9189 Other postprocedural complications and disorders of digestive system: Secondary | ICD-10-CM | POA: Diagnosis not present

## 2020-05-05 DIAGNOSIS — R634 Abnormal weight loss: Secondary | ICD-10-CM | POA: Diagnosis not present

## 2020-05-05 MED ORDER — BARIUM SULFATE 2.1 % PO SUSP
ORAL | Status: AC
  Filled 2020-05-05: qty 2

## 2020-05-05 MED ORDER — IOHEXOL 300 MG/ML  SOLN
75.0000 mL | Freq: Once | INTRAMUSCULAR | Status: AC | PRN
  Administered 2020-05-05: 75 mL via INTRAVENOUS

## 2020-05-06 ENCOUNTER — Telehealth: Payer: Self-pay

## 2020-05-06 ENCOUNTER — Other Ambulatory Visit (HOSPITAL_COMMUNITY): Payer: Self-pay | Admitting: Gastroenterology

## 2020-05-06 ENCOUNTER — Encounter (HOSPITAL_COMMUNITY): Payer: Self-pay

## 2020-05-06 DIAGNOSIS — E43 Unspecified severe protein-calorie malnutrition: Secondary | ICD-10-CM

## 2020-05-06 NOTE — Telephone Encounter (Signed)
Tried to call patient as well as her husband to give them verbal appointment information. Left a voicemail for Robyn Ashley. Robyn Ashley voicemail has not been set up. Sent the instructions via My Chart. JM

## 2020-05-06 NOTE — Telephone Encounter (Signed)
Faxed CT Chest to Dr Conners with office note and EGD report.

## 2020-05-07 ENCOUNTER — Other Ambulatory Visit (HOSPITAL_COMMUNITY)
Admission: RE | Admit: 2020-05-07 | Discharge: 2020-05-07 | Disposition: A | Discharge status: Home / Self Care | Payer: Medicare Other | Source: Ambulatory Visit | Attending: Gastroenterology | Admitting: Gastroenterology

## 2020-05-07 DIAGNOSIS — Z01812 Encounter for preprocedural laboratory examination: Secondary | ICD-10-CM | POA: Insufficient documentation

## 2020-05-07 DIAGNOSIS — Z20822 Contact with and (suspected) exposure to covid-19: Secondary | ICD-10-CM | POA: Diagnosis not present

## 2020-05-07 LAB — SARS CORONAVIRUS 2 (TAT 6-24 HRS): SARS Coronavirus 2: NEGATIVE

## 2020-05-09 ENCOUNTER — Other Ambulatory Visit: Payer: Self-pay | Admitting: Student

## 2020-05-10 ENCOUNTER — Ambulatory Visit (HOSPITAL_COMMUNITY): Payer: Medicare Other

## 2020-05-10 ENCOUNTER — Other Ambulatory Visit: Payer: Self-pay | Admitting: Radiology

## 2020-05-10 ENCOUNTER — Ambulatory Visit (HOSPITAL_COMMUNITY)
Admission: RE | Admit: 2020-05-10 | Discharge: 2020-05-10 | Disposition: A | Discharge status: Home / Self Care | Payer: Medicare Other | Source: Ambulatory Visit | Attending: Radiology | Admitting: Radiology

## 2020-05-10 ENCOUNTER — Other Ambulatory Visit: Payer: Self-pay

## 2020-05-10 ENCOUNTER — Observation Stay (HOSPITAL_COMMUNITY)
Admission: RE | Admit: 2020-05-10 | Discharge: 2020-05-11 | Disposition: A | Discharge status: Home / Self Care | Payer: Medicare Other | Source: Ambulatory Visit | Attending: Internal Medicine | Admitting: Internal Medicine

## 2020-05-10 VITALS — BP 93/49 | HR 64 | Temp 99.4°F | Resp 18 | Ht 61.5 in | Wt 119.5 lb

## 2020-05-10 DIAGNOSIS — E039 Hypothyroidism, unspecified: Secondary | ICD-10-CM | POA: Insufficient documentation

## 2020-05-10 DIAGNOSIS — M810 Age-related osteoporosis without current pathological fracture: Secondary | ICD-10-CM | POA: Diagnosis not present

## 2020-05-10 DIAGNOSIS — Z881 Allergy status to other antibiotic agents status: Secondary | ICD-10-CM | POA: Diagnosis not present

## 2020-05-10 DIAGNOSIS — E43 Unspecified severe protein-calorie malnutrition: Secondary | ICD-10-CM | POA: Diagnosis not present

## 2020-05-10 DIAGNOSIS — I341 Nonrheumatic mitral (valve) prolapse: Secondary | ICD-10-CM | POA: Diagnosis not present

## 2020-05-10 DIAGNOSIS — R188 Other ascites: Secondary | ICD-10-CM | POA: Insufficient documentation

## 2020-05-10 DIAGNOSIS — Z886 Allergy status to analgesic agent status: Secondary | ICD-10-CM | POA: Diagnosis not present

## 2020-05-10 DIAGNOSIS — Z87891 Personal history of nicotine dependence: Secondary | ICD-10-CM | POA: Diagnosis not present

## 2020-05-10 DIAGNOSIS — Z888 Allergy status to other drugs, medicaments and biological substances status: Secondary | ICD-10-CM | POA: Insufficient documentation

## 2020-05-10 DIAGNOSIS — K219 Gastro-esophageal reflux disease without esophagitis: Secondary | ICD-10-CM | POA: Diagnosis not present

## 2020-05-10 DIAGNOSIS — Z8669 Personal history of other diseases of the nervous system and sense organs: Secondary | ICD-10-CM

## 2020-05-10 DIAGNOSIS — Z931 Gastrostomy status: Secondary | ICD-10-CM | POA: Diagnosis not present

## 2020-05-10 DIAGNOSIS — R1310 Dysphagia, unspecified: Secondary | ICD-10-CM | POA: Diagnosis not present

## 2020-05-10 DIAGNOSIS — R55 Syncope and collapse: Secondary | ICD-10-CM | POA: Diagnosis not present

## 2020-05-10 DIAGNOSIS — G47419 Narcolepsy without cataplexy: Secondary | ICD-10-CM | POA: Diagnosis present

## 2020-05-10 DIAGNOSIS — R109 Unspecified abdominal pain: Secondary | ICD-10-CM | POA: Diagnosis not present

## 2020-05-10 DIAGNOSIS — Z79890 Hormone replacement therapy (postmenopausal): Secondary | ICD-10-CM | POA: Diagnosis not present

## 2020-05-10 DIAGNOSIS — I10 Essential (primary) hypertension: Secondary | ICD-10-CM | POA: Insufficient documentation

## 2020-05-10 DIAGNOSIS — M8580 Other specified disorders of bone density and structure, unspecified site: Secondary | ICD-10-CM | POA: Diagnosis not present

## 2020-05-10 DIAGNOSIS — Z6821 Body mass index (BMI) 21.0-21.9, adult: Secondary | ICD-10-CM | POA: Insufficient documentation

## 2020-05-10 DIAGNOSIS — Z8249 Family history of ischemic heart disease and other diseases of the circulatory system: Secondary | ICD-10-CM | POA: Insufficient documentation

## 2020-05-10 DIAGNOSIS — F32A Depression, unspecified: Secondary | ICD-10-CM | POA: Insufficient documentation

## 2020-05-10 DIAGNOSIS — G4733 Obstructive sleep apnea (adult) (pediatric): Secondary | ICD-10-CM | POA: Diagnosis not present

## 2020-05-10 DIAGNOSIS — R1319 Other dysphagia: Secondary | ICD-10-CM

## 2020-05-10 DIAGNOSIS — Z90710 Acquired absence of both cervix and uterus: Secondary | ICD-10-CM | POA: Diagnosis not present

## 2020-05-10 DIAGNOSIS — Z79899 Other long term (current) drug therapy: Secondary | ICD-10-CM | POA: Diagnosis not present

## 2020-05-10 DIAGNOSIS — R627 Adult failure to thrive: Secondary | ICD-10-CM | POA: Diagnosis not present

## 2020-05-10 DIAGNOSIS — E785 Hyperlipidemia, unspecified: Secondary | ICD-10-CM | POA: Insufficient documentation

## 2020-05-10 HISTORY — DX: Essential (primary) hypertension: I10

## 2020-05-10 HISTORY — DX: Hyperlipidemia, unspecified: E78.5

## 2020-05-10 HISTORY — DX: Hypothyroidism, unspecified: E03.9

## 2020-05-10 HISTORY — PX: IR GASTROSTOMY TUBE MOD SED: IMG625

## 2020-05-10 LAB — CBC
HCT: 37.4 % (ref 36.0–46.0)
HCT: 33.5 % — ABNORMAL LOW (ref 36.0–46.0)
Hemoglobin: 12 g/dL (ref 12.0–15.0)
Hemoglobin: 10.7 g/dL — ABNORMAL LOW (ref 12.0–15.0)
MCH: 29.3 pg (ref 26.0–34.0)
MCH: 29.6 pg (ref 26.0–34.0)
MCHC: 32.1 g/dL (ref 30.0–36.0)
MCHC: 31.9 g/dL (ref 30.0–36.0)
MCV: 91.4 fL (ref 80.0–100.0)
MCV: 92.8 fL (ref 80.0–100.0)
Platelets: 345 10*3/uL (ref 150–400)
Platelets: 281 10*3/uL (ref 150–400)
RBC: 4.09 MIL/uL (ref 3.87–5.11)
RBC: 3.61 MIL/uL — ABNORMAL LOW (ref 3.87–5.11)
RDW: 12.1 % (ref 11.5–15.5)
RDW: 12 % (ref 11.5–15.5)
WBC: 6 10*3/uL (ref 4.0–10.5)
WBC: 8.8 10*3/uL (ref 4.0–10.5)
nRBC: 0 % (ref 0.0–0.2)
nRBC: 0 % (ref 0.0–0.2)

## 2020-05-10 LAB — BASIC METABOLIC PANEL
Anion gap: 9 (ref 5–15)
BUN: 16 mg/dL (ref 8–23)
CO2: 27 mmol/L (ref 22–32)
Calcium: 8.2 mg/dL — ABNORMAL LOW (ref 8.9–10.3)
Chloride: 104 mmol/L (ref 98–111)
Creatinine, Ser: 0.64 mg/dL (ref 0.44–1.00)
GFR, Estimated: >60 mL/min (ref >60)
Glucose, Bld: 97 mg/dL (ref 70–99)
Potassium: 3.3 mmol/L — ABNORMAL LOW (ref 3.5–5.1)
Sodium: 140 mmol/L (ref 135–145)

## 2020-05-10 LAB — PROTIME-INR
INR: 1.1 (ref 0.8–1.2)
Prothrombin Time: 13.8 seconds (ref 11.4–15.2)

## 2020-05-10 MED ORDER — LACTATED RINGERS IV SOLN: INTRAVENOUS | Status: DC

## 2020-05-10 MED ORDER — ATROPINE SULFATE 1 MG/ML IJ SOLN
INTRAMUSCULAR | Status: AC
  Filled 2020-05-10: qty 1

## 2020-05-10 MED ORDER — FLUOXETINE HCL 10 MG PO CAPS
10.0000 mg | ORAL_CAPSULE | Freq: Every day | ORAL | Status: DC
  Administered 2020-05-11: 10 mg via ORAL
  Filled 2020-05-10: qty 1

## 2020-05-10 MED ORDER — FENTANYL CITRATE (PF) 100 MCG/2ML IJ SOLN
INTRAMUSCULAR | Status: AC
  Filled 2020-05-10: qty 2

## 2020-05-10 MED ORDER — DOCUSATE SODIUM 100 MG PO CAPS
100.0000 mg | ORAL_CAPSULE | Freq: Two times a day (BID) | ORAL | Status: DC
  Administered 2020-05-11: 100 mg via ORAL
  Filled 2020-05-10: qty 1

## 2020-05-10 MED ORDER — HYOSCYAMINE SULFATE 0.125 MG SL SUBL
0.1250 mg | SUBLINGUAL_TABLET | SUBLINGUAL | Status: DC | PRN
  Administered 2020-05-10: 0.125 mg via SUBLINGUAL
  Filled 2020-05-10 (×2): qty 1

## 2020-05-10 MED ORDER — LIDOCAINE VISCOUS HCL 2 % MT SOLN
OROMUCOSAL | Status: AC
  Filled 2020-05-10: qty 15

## 2020-05-10 MED ORDER — LIDOCAINE HCL 1 % IJ SOLN
INTRAMUSCULAR | Status: DC | PRN
  Administered 2020-05-10: 30 mL via INTRADERMAL

## 2020-05-10 MED ORDER — LIDOCAINE HCL (PF) 1 % IJ SOLN
INTRAMUSCULAR | Status: AC
  Filled 2020-05-10: qty 30

## 2020-05-10 MED ORDER — METOCLOPRAMIDE HCL 5 MG PO TABS
10.0000 mg | ORAL_TABLET | Freq: Three times a day (TID) | ORAL | Status: DC
  Administered 2020-05-10 – 2020-05-11 (×4): 10 mg via ORAL
  Filled 2020-05-10 (×4): qty 2

## 2020-05-10 MED ORDER — SODIUM CHLORIDE 0.9 % IV SOLN
INTRAVENOUS | Status: DC | PRN
  Administered 2020-05-10: 10 mL/h via INTRAVENOUS

## 2020-05-10 MED ORDER — SODIUM CHLORIDE 0.9 % IV BOLUS
1000.0000 mL | Freq: Once | INTRAVENOUS | Status: AC
  Administered 2020-05-10: 1000 mL via INTRAVENOUS

## 2020-05-10 MED ORDER — MORPHINE SULFATE (PF) 2 MG/ML IV SOLN: 2.0000 mg | INTRAVENOUS | Status: DC | PRN

## 2020-05-10 MED ORDER — ACETAMINOPHEN 325 MG PO TABS: 650.0000 mg | ORAL_TABLET | Freq: Four times a day (QID) | ORAL | Status: DC | PRN

## 2020-05-10 MED ORDER — CEFAZOLIN SODIUM-DEXTROSE 2-4 GM/100ML-% IV SOLN: 2.0000 g | Freq: Once | INTRAVENOUS | Status: AC

## 2020-05-10 MED ORDER — ONDANSETRON HCL 4 MG/2ML IJ SOLN: 4.0000 mg | Freq: Four times a day (QID) | INTRAMUSCULAR | Status: DC | PRN

## 2020-05-10 MED ORDER — SODIUM CHLORIDE 0.9% FLUSH
3.0000 mL | Freq: Two times a day (BID) | INTRAVENOUS | Status: DC
  Administered 2020-05-11: 3 mL via INTRAVENOUS

## 2020-05-10 MED ORDER — BISACODYL 5 MG PO TBEC: 5.0000 mg | DELAYED_RELEASE_TABLET | Freq: Every day | ORAL | Status: DC | PRN

## 2020-05-10 MED ORDER — OXYCODONE HCL 5 MG PO TABS: 5.0000 mg | ORAL_TABLET | ORAL | Status: DC | PRN

## 2020-05-10 MED ORDER — SODIUM CHLORIDE 0.9 % IV SOLN: INTRAVENOUS | Status: DC

## 2020-05-10 MED ORDER — GLUCAGON HCL RDNA (DIAGNOSTIC) 1 MG IJ SOLR
INTRAMUSCULAR | Status: AC
  Filled 2020-05-10: qty 1

## 2020-05-10 MED ORDER — ONDANSETRON HCL 4 MG/2ML IJ SOLN: 4.0000 mg | INTRAMUSCULAR | Status: DC | PRN

## 2020-05-10 MED ORDER — ENOXAPARIN SODIUM 40 MG/0.4ML ~~LOC~~ SOLN
40.0000 mg | SUBCUTANEOUS | Status: DC
  Administered 2020-05-10: 40 mg via SUBCUTANEOUS
  Filled 2020-05-10: qty 0.4

## 2020-05-10 MED ORDER — ATROPINE SULFATE 1 MG/10ML IJ SOSY
PREFILLED_SYRINGE | INTRAMUSCULAR | Status: DC | PRN
  Administered 2020-05-10: 1 mg via INTRAVENOUS

## 2020-05-10 MED ORDER — CEFAZOLIN SODIUM-DEXTROSE 2-4 GM/100ML-% IV SOLN
INTRAVENOUS | Status: AC
  Administered 2020-05-10: 2 g via INTRAVENOUS
  Filled 2020-05-10: qty 100

## 2020-05-10 MED ORDER — LIDOCAINE VISCOUS HCL 2 % MT SOLN
OROMUCOSAL | Status: DC | PRN
  Administered 2020-05-10: 15 mL via OROMUCOSAL

## 2020-05-10 MED ORDER — MIDAZOLAM HCL 2 MG/2ML IJ SOLN
INTRAMUSCULAR | Status: DC | PRN
  Administered 2020-05-10: 0.5 mg via INTRAVENOUS
  Administered 2020-05-10: 1 mg via INTRAVENOUS
  Administered 2020-05-10: 0.5 mg via INTRAVENOUS

## 2020-05-10 MED ORDER — MIDAZOLAM HCL 2 MG/2ML IJ SOLN
INTRAMUSCULAR | Status: AC
  Filled 2020-05-10: qty 2

## 2020-05-10 MED ORDER — SODIUM CHLORIDE 0.9 % IV BOLUS: 250.0000 mL | Freq: Once | INTRAVENOUS | Status: DC

## 2020-05-10 MED ORDER — LEVOTHYROXINE SODIUM 50 MCG PO TABS
50.0000 ug | ORAL_TABLET | Freq: Every day | ORAL | Status: DC
  Filled 2020-05-10: qty 1

## 2020-05-10 MED ORDER — HYDROCODONE-ACETAMINOPHEN 5-325 MG PO TABS: 1.0000 | ORAL_TABLET | ORAL | Status: DC | PRN

## 2020-05-10 MED ORDER — ACETAMINOPHEN 650 MG RE SUPP
650.0000 mg | Freq: Four times a day (QID) | RECTAL | Status: DC | PRN
  Administered 2020-05-10: 650 mg via RECTAL
  Filled 2020-05-10: qty 1

## 2020-05-10 MED ORDER — FENTANYL CITRATE (PF) 100 MCG/2ML IJ SOLN
INTRAMUSCULAR | Status: DC | PRN
  Administered 2020-05-10: 50 ug via INTRAVENOUS
  Administered 2020-05-10: 25 ug via INTRAVENOUS

## 2020-05-10 MED ORDER — ONDANSETRON HCL 4 MG PO TABS: 4.0000 mg | ORAL_TABLET | Freq: Four times a day (QID) | ORAL | Status: DC | PRN

## 2020-05-10 MED ORDER — ATORVASTATIN CALCIUM 10 MG PO TABS
20.0000 mg | ORAL_TABLET | Freq: Every day | ORAL | Status: DC
  Administered 2020-05-10: 20 mg via ORAL
  Filled 2020-05-10: qty 2

## 2020-05-10 MED ORDER — IOHEXOL 300 MG/ML  SOLN
50.0000 mL | Freq: Once | INTRAMUSCULAR | Status: AC | PRN
  Administered 2020-05-10: 20 mL

## 2020-05-10 MED ORDER — IOHEXOL 300 MG/ML  SOLN
100.0000 mL | Freq: Once | INTRAMUSCULAR | Status: AC | PRN
  Administered 2020-05-10: 100 mL via INTRAVENOUS

## 2020-05-10 MED ORDER — POLYETHYLENE GLYCOL 3350 17 G PO PACK: 17.0000 g | PACK | Freq: Every day | ORAL | Status: DC | PRN

## 2020-05-10 NOTE — Sedation Documentation (Signed)
Pt bradycardiac at 19bpm. Code blue called. Md and RN at bedside

## 2020-05-10 NOTE — Sedation Documentation (Signed)
Pt drowsy but arousable

## 2020-05-10 NOTE — Sedation Documentation (Signed)
Having abdominal pain during procedure, heart rate starting to drop. BP 52 35  Dr Serafina Royals aware.Patient is pale, not diaphoretic, opens eyes to calling her name. ? Vasovagal. NS bolus started.  Atropine being pulled from Pyxis.

## 2020-05-10 NOTE — Sedation Documentation (Signed)
Taken to nurses station on monitor and fluids NS infusing wide open. Patient sleepy but awake, oriented. VS stable except sp02 88 on room air. HOB elevated 45 degrees. O2 2L established. Stomach area a little sore, 2/10. Dr Serafina Royals to call patient's daughter when he is done with his current case.

## 2020-05-10 NOTE — Procedures (Signed)
Interventional Radiology Procedure Note  Procedure: Placement of percutaneous 20F balloon retention gastrostomy tube.  Complications: Symptomatic bradycardia (HR=20s-30s) with associated hypotension.  No loss of consciousness.  Defibrillator pads applied, code team called.  1 mg atropine administered IV with appropriate response.  Vitals stable upon completion of procedure.  1 L NS administered.  Recommendations: - Bedrest for 3 hours after gastrostomy tube placement - Please discharge home after 3-4 hours  - Clear liquids for remainder of today and overnight - May advance diet as tolerated and begin using tube tomorrow morning   Ruthann Cancer, MD

## 2020-05-10 NOTE — Sedation Documentation (Signed)
Pt drowsy but arousable, answers questions appropriately

## 2020-05-10 NOTE — Sedation Documentation (Signed)
Patient complaining of abd pain. Versed 0.5mg  given

## 2020-05-10 NOTE — Plan of Care (Signed)

## 2020-05-10 NOTE — H&P (Signed)
Chief Complaint: Dysphagia with failure to thrive. Request is for gastrostomy tube placement. Previous surgically placed G tube removed in September 2021  Referring Physician(s): Mauri Pole  Supervising Physician: Arne Cleveland  Patient Status: [facility] - Out-pt  History of Present Illness: Robyn Ashley is a 70 y.o. female  outpatient. History of recurrent hiatal hernia and persistent distal esophageal stricture s/p Nissen takedown and gastrostomy tube placement by surgery on 3.02.20. Per surgical notes an 10 Fr Stamm gastrostomy tube. Per noted from Dr. Kae Heller in general surgery patient has been able to maintain her weight with supplement feedings without the use of the gastrostomy tube and the gastrotomy tube was removed. Patient was seen by GI on 10.5.21 and found to have severe protein energy malnutrition. Team is requesting a gastrotomy tube replacement for enteral feeds. Per Patient the last esophageal dilation was performed last week.    Past Medical History:  Diagnosis Date  . Allergy   . Anemia   . Anginal pain ([place]) 2015  . Depression   . Dysrhythmia 2015   SVT  . GERD (gastroesophageal reflux disease)   . Hiatal hernia   . History of degenerative disc disease   . History of nuclear stress test 07/27/2013   in care everywhere per cardiology note by dr Donata Clay:  test normal  . Hyperlipidemia   . Hypertension   . Hypothyroidism   . Intermittent palpitations   . MVP (mitral valve prolapse)    per pt dx 1980s,  last echo done 2016 by dr Donata Clay [facility] cardiology in [place]) and last cardiology visit 11-02-2014 in epic;   01-27-2019 per pt episodic palpitations and takes toprol, currently followed by pcp  . Narcolepsy   . OSA (obstructive sleep apnea)    Not on CPAP  . Osteoporosis    osteopenia  . Sleep apnea   . TMJ arthralgia     Past Surgical History:  Procedure Laterality Date  . ABDOMINAL HYSTERECTOMY    . BIOPSY  06/24/2018   Procedure: BIOPSY;   Surgeon: Daneil Dolin, MD;  [facility]: [facility];  Service: Endoscopy;;  (gastric) (colon)  . BREAST CYST ASPIRATION    . COLONOSCOPY N/A 06/24/2018   Procedure: COLONOSCOPY;  Surgeon: Daneil Dolin, MD;  Location: [facility];  Service: Endoscopy;  Laterality: N/A;  1:45pm  . ESOPHAGEAL DILATION  09/02/2019   Procedure: ESOPHAGEAL DILATION;  Surgeon: Mauri Pole, MD;  Location: [facility];  Service: Endoscopy;;  . ESOPHAGEAL MANOMETRY N/A 09/02/2019   Procedure: ESOPHAGEAL MANOMETRY (EM);  Surgeon: Mauri Pole, MD;  Location: [facility];  Service: Endoscopy;  Laterality: N/A;  . ESOPHAGOGASTRODUODENOSCOPY N/A 06/24/2018   Procedure: ESOPHAGOGASTRODUODENOSCOPY (EGD);  Surgeon: Daneil Dolin, MD;  [facility];  Service: Endoscopy;  Laterality: N/A;  . ESOPHAGOGASTRODUODENOSCOPY N/A 07/14/2019   Procedure: ESOPHAGOGASTRODUODENOSCOPY (EGD);  Surgeon: Daneil Dolin, MD;  [facility];  Service: Endoscopy;  Laterality: N/A;  3:00pm  . ESOPHAGOGASTRODUODENOSCOPY (EGD) WITH PROPOFOL N/A 09/02/2019   Procedure: ESOPHAGOGASTRODUODENOSCOPY (EGD) WITH PROPOFOL;  Surgeon: Mauri Pole, MD;  Location: [facility];  Service: Endoscopy;  Laterality: N/A;   mano probe placement  . EYE SURGERY Bilateral    rk  . MALONEY DILATION N/A 06/24/2018   Procedure: Venia Minks DILATION;  Surgeon: Daneil Dolin, MD;  [facility];  Service: Endoscopy;  Laterality: N/A;  Venia Minks DILATION N/A 07/14/2019   Procedure: Venia Minks DILATION;  Surgeon: Daneil Dolin, MD;  [facility];  Service: Endoscopy;  Laterality: N/A;  . NISSEN FUNDOPLICATION  [id number]   takedown  . POLYPECTOMY  06/24/2018   Procedure: POLYPECTOMY;  Surgeon: Daneil Dolin, MD;  [facility];  Service: Endoscopy;;  colon   . RF ablation of cervical nerves    . ROTATOR CUFF REPAIR Right 11-21-2006  dr supple @[facility]     Allergies: Tetracyclines & related,  Aspirin, Norvasc [amlodipine besylate], Nsaids, and Dexilant [dexlansoprazole]  Medications: Prior to Admission medications   Medication Sig Start Date End Date Taking? Authorizing Provider  acetaminophen (TYLENOL) 500 MG tablet Take 500 mg by mouth every 8 (eight) hours as needed.   Yes [provider]  atorvastatin (LIPITOR) 20 MG tablet Take 20 mg by mouth at bedtime.    Yes [provider]  cholecalciferol (VITAMIN D3) 25 MCG (1000 UT) tablet Take 1,000 Units by mouth daily.   Yes [provider]  enalapril-hydrochlorothiazide (VASERETIC) 10-25 MG tablet Take 1 tablet by mouth daily. 03/06/18  Yes [provider]  FLUoxetine (PROZAC) 10 MG capsule Take 10 mg by mouth daily.  04/01/18  Yes [provider]  levothyroxine (SYNTHROID, LEVOTHROID) 50 MCG tablet Take 50 mcg by mouth daily before breakfast.  04/26/18  Yes [provider]  methylphenidate (RITALIN) 20 MG tablet Take 20 mg by mouth 3 (three) times daily.   Yes [provider]  metoCLOPramide (REGLAN) 10 MG tablet Take 10 mg by mouth 4 (four) times daily -  before meals and at bedtime.   Yes [provider]  metoprolol succinate (TOPROL-XL) 50 MG 24 hr tablet Take 50 mg by mouth at bedtime.  04/03/18  Yes [provider]  Multiple Vitamin (MULTIVITAMIN) tablet Take 1 tablet by mouth daily.   Yes [provider]  NON FORMULARY CPAP at night   Yes [provider]  valACYclovir (VALTREX) 1000 MG tablet Take 1,000 mg by mouth 2 (two) times daily as needed (outbreak).     [provider]     Family History  Problem Relation Age of Onset  . Colon cancer Maternal Grandfather   . Colon cancer Paternal Grandmother   . Alzheimer's disease Mother   . Heart disease Father   . COPD Father   . Breast cancer Maternal Grandmother   . Liver cancer Brother   . Colon polyps Neg Hx   . Stomach cancer Neg Hx   . Esophageal cancer Neg Hx   .  Rectal cancer Neg Hx     Social History   Socioeconomic History  . Marital status: Married    Spouse name: Not on file  . Number of children: Not on file  . Years of education: Not on file  . Highest education level: Not on file  Occupational History  . Not on file  Tobacco Use  . Smoking status: Former Smoker    Packs/day: 1.50    Types: Cigarettes    Start date: 1970    Quit date: 07/1997    Years since quitting: 22.7  . Smokeless tobacco: Never Used  Vaping Use  . Vaping Use: Never used  Substance and Sexual Activity  . Alcohol use: Not Currently    Comment: none in many years  . Drug use: Never  . Sexual activity: Not on file  Other Topics Concern  . Not on file  Social History Narrative  . Not on file   Social Determinants of Health   Financial Resource Strain:   .  Difficulty of Paying Living Expenses: Not on file  Food Insecurity:   . Worried About Charity fundraiser in the Last Year: Not on file  . Ran Out of Food in the Last Year: Not on file  Transportation Needs:   . Lack of Transportation (Medical): Not on file  . Lack of Transportation (Non-Medical): Not on file  Physical Activity:   . Days of Exercise per Week: Not on file  . Minutes of Exercise per Session: Not on file  Stress:   . Feeling of Stress : Not on file  Social Connections:   . Frequency of Communication with Friends and Family: Not on file  . Frequency of Social Gatherings with Friends and Family: Not on file  . Attends Religious Services: Not on file  . Active Member of Clubs or Organizations: Not on file  . Attends Archivist Meetings: Not on file  . Marital Status: Not on file    Review of Systems: A 12 point ROS discussed and pertinent positives are indicated in the HPI above.  All other systems are negative.  Review of Systems  Constitutional: Negative for fatigue and fever.  HENT: Negative for congestion.   Respiratory: Negative for cough and shortness of breath.    Gastrointestinal: Negative for abdominal pain, diarrhea, nausea and vomiting.    Vital Signs: BP 113/66   Pulse (!) 53   Temp (!) 97.4 F (36.3 C) (Oral)   Resp 14   Ht 5' 1.5" (1.562 m)   Wt 115 lb (52.2 kg)   SpO2 98%   BMI 21.38 kg/m   Physical Exam Vitals and nursing note reviewed.  Constitutional:      Appearance: She is well-developed.  HENT:     Head: Normocephalic and atraumatic.  Eyes:     Conjunctiva/sclera: Conjunctivae normal.  Cardiovascular:     Rate and Rhythm: Normal rate and regular rhythm.     Heart sounds: Normal heart sounds.  Pulmonary:     Effort: Pulmonary effort is normal.  Abdominal:     Comments: Scar noted from former g tube site.    Musculoskeletal:     Cervical back: Normal range of motion.  Neurological:     Mental Status: She is alert and oriented to person, place, and time.     Imaging: CT CHEST W CONTRAST  Result Date: 05/06/2020 CLINICAL DATA:  Weight loss and dysphagia post Nissen fundoplication. EGD dilatation September 2021. EXAM: CT CHEST WITH CONTRAST TECHNIQUE: Multidetector CT imaging of the chest was performed during intravenous contrast administration. CONTRAST:  46mL OMNIPAQUE IOHEXOL 300 MG/ML  SOLN COMPARISON:  01/29/2019 and recent abdominal CT today. FINDINGS: Cardiovascular: Borderline cardiomegaly. Thoracic aorta is normal in caliber. There is mild calcified plaque over the thoracic aorta. Remaining vascular structures are unremarkable. Mediastinum/Nodes: No evidence of mediastinal or hilar adenopathy. Contrast present throughout the esophagus which may be due to dysmotility, reflux or degree of gastroesophageal junction obstruction. Mild wall thickening of the distal esophagus/gastroesophageal junction likely partially related to patient's Nissen fundoplication. Lungs/Pleura: Lungs are adequately inflated. There is mild emphysematous disease. No focal airspace process or effusion. Airways are normal. Upper Abdomen:  Calcified plaque over the abdominal aorta. No acute findings. Musculoskeletal: No focal abnormality. IMPRESSION: 1. No acute cardiopulmonary disease. 2. Contrast throughout the esophagus which may be due to dysmotility, reflux or degree of relative obstruction at the gastroesophageal junction. Mild wall thickening of the distal esophagus/gastroesophageal junction likely due in part to patient's Nissen fundoplication. 3.  Emphysema and aortic atherosclerosis. Aortic Atherosclerosis (ICD10-I70.0) and Emphysema (ICD10-J43.9). Electronically Signed   By: Marin Olp M.D.   On: 05/06/2020 12:50   CT ABDOMEN W CONTRAST  Result Date: 05/05/2020 CLINICAL DATA:  Chronic dysphagia. Possible hiatal hernia. Possible G-tube placement. Gastroesophageal reflux disease. Hypertension. Prior esophageal dilatation. Ex-smoker. Emphysema. EXAM: CT ABDOMEN WITH CONTRAST TECHNIQUE: Multidetector CT imaging of the abdomen was performed using the standard protocol following bolus administration of intravenous contrast. CONTRAST:  14mL OMNIPAQUE IOHEXOL 300 MG/ML  SOLN COMPARISON:  06/12/2019 upper GI. No comparison abdominal CT. Chest CT 01/29/2019. Esophagram of 02/29/2020 also reviewed. FINDINGS: Lower chest: Clear lung bases. Normal heart size without pericardial or pleural effusion. Mild distal esophageal wall thickening on 01/02, felt to be similar to 01/29/2019 CT. Tiny hiatal hernia. Fluid level in the distal esophagus. Hepatobiliary: Normal liver. Normal gallbladder, without biliary ductal dilatation. Pancreas: Normal, without mass or ductal dilatation. Spleen: Normal in size, without focal abnormality. Adrenals/Urinary Tract: Normal adrenal glands. Normal right kidney. Left renal sinus cysts. No hydronephrosis. Stomach/Bowel: At the level of the gastroesophageal junction, there is ill definition of fat planes, including on 08/02. This is presumably postoperative, in this patient who is status post prior fundoplication. The  more distal stomach is within normal limits. Normal abdominal large and small bowel loops. Vascular/Lymphatic: Aortic atherosclerosis. Infrarenal aortic minimal dilatation at 2.1 cm. No retroperitoneal or retrocrural adenopathy. Other: No ascites. Musculoskeletal: Degenerative disc disease at L4-5 and less so L5-S1. Mild convex left lumbar spine curvature. IMPRESSION: 1. Small hiatal hernia with distal esophageal wall thickening. Contrast level in the distal esophagus suggests dysmotility or gastroesophageal reflux. 2. Ill definition of fat planes at the region of the gastroesophageal junction, at the site of prior [employer] fundoplication. Likely postoperative. 3.  No acute abdominal process. 4.  Aortic Atherosclerosis (ICD10-I70.0). Electronically Signed   By: Abigail Miyamoto M.D.   On: 05/05/2020 15:34    Labs:  CBC: Recent Labs    09/24/19 1429 09/30/19 0416 10/01/19 0404  WBC 7.9 8.0 8.4  HGB 12.1 10.3* 9.7*  HCT 37.3 32.0* 30.3*  PLT 375 297 302    COAGS: No results for input(s): INR, APTT in the last 8760 hours.  BMP: Recent Labs    09/24/19 1429 09/30/19 0416 10/01/19 0404 05/03/20 1033  NA 140 139 142 137  K 4.0 3.6 3.3* 3.9  CL 101 105 104 97  CO2 30 27 29  34*  GLUCOSE 95 109* 94 88  BUN 14 13 13 19   CALCIUM 9.1 8.2* 8.5* 9.4  CREATININE 0.71 0.70 0.56 0.67  GFRNONAA >60 >60 >60  --   GFRAA >60 >60 >60  --     LIVER FUNCTION TESTS: Recent Labs    09/24/19 1429 05/03/20 1033  BILITOT 0.7 0.4  AST 25 25  ALT 15 19  ALKPHOS 110 93  PROT 7.3 7.6  ALBUMIN 4.2 4.4     Assessment and Plan:  69 y.o. female outpatient. History of recurrent hiatal hernia and persistent distal esophageal stricture s/p Nissen takedown and  a 18 Fr Stamm gastrostomy tube wplaced bydby surgery on 3.2.21. Per note from Dr. Kae Heller in general surgery patient has been able to maintain her weight with supplement feedings without the use of the gastrostomy tube and the gastrotomy tube was  removed in August in 2021. On 10.5.21 the Patient was seen by GI and found to have severe protein energy malnutrition. Team is requesting a gastrotomy tube replacement for enteral feeds.  Per Patient the last esophageal dilation was performed last week. .All labs and medications are within acceptable parameters. No pertinent allergies. Risks and benefits image guided gastrostomy tube placement was discussed with the patient including, but not limited to the need for a barium enema during the procedure, bleeding, infection, peritonitis and/or damage to adjacent structures.  All of the patient's questions were answered, patient is agreeable to proceed.  Consent signed and in chart.   Thank you for this interesting consult.  I greatly enjoyed meeting TAWONNA ESQUER and look forward to participating in their care.  A copy of this report was sent to the requesting provider on this date.  Electronically Signed: Jacqualine Mau, NP 05/10/2020, 8:13 AM   I spent a total of  30 Minutes   in face to face in clinical consultation, greater than 50% of which was counseling/coordinating care for gastrostomy tube placemetnnt

## 2020-05-10 NOTE — Progress Notes (Signed)
   05/10/20 1002  Clinical Encounter Type  Visited With Patient not available (Chaplain respond. Patient patient with medical team. )  Visit Type ED  Referral From Nurse  Consult/Referral To Chaplain  Chaplain responded. Spoke with nurse, state spiritual care contacted as precaution. No family present.This note was prepared by Jeanine Luz, M.Div..  For questions please contact by phone [phone].

## 2020-05-10 NOTE — Progress Notes (Signed)
Dr Lorin Mercy in to speak with pt

## 2020-05-10 NOTE — Sedation Documentation (Signed)
Code team arrived

## 2020-05-10 NOTE — H&P (Signed)
History and Physical    LILAC HOFF ITG:[id number] DOB: 01-09-1951 DOA: 05/10/2020  PCP: Redmond School, MD Consultants:  Kae Heller - surgery; Nandigam - GI Patient coming from:  Home - lives with husband and granddaughter; NOK:  Husband, [phone]   Chief Complaint: G-tube placement, vagal episodes  HPI: Robyn Ashley is a 69 y.o. female with medical history significant of OSA not on CPAP; narcolepsy; hypothyroidism; HTN; HLD; and depression  presenting for G-tube placement.  She had a hiatal hernia repair in 01/2019 and she was never ble to advance her diet.  She had numerous surgery and it was wrapped too tight.  They undid the Nissen in July but still unable to eat/drink formula at times. She had an esophageal dilation a week ago with no improvement.  She had a feeding tube from March through August and had been doing well so they removed it.  However, she is back to losing weight and they were planning to replace it.  They are planning to look at what's causing her esophageal dysmotility.  They went to place the tube and she developed severe abdominal pain and she got very dizzy and her pressure dropped.  "and that's where we are now."  She is still having abdominal pain - which is unusual for her.  She is not currently feeling dizzt or lightheaded.   ED Course:  Per IR:  recurrent hiatal hernia and persistent distal esophageal stricture s/p Nissen takedown and gastrostomy tube placement by surgery on 3.02.20. Per surgical notes an 65 Fr Stamm gastrostomy tube. Per noted from Dr. Kae Heller in general surgery patient has been able to maintain her weight with supplement feedings without the use of the gastrostomy tube and the gastrotomy tube was removed. Patient was seen by GI on 10.5.21 and found to have severe protein energy malnutrition.   G-tube replacement, h/o stricture with serial dilations.  Had a vagal response, HR 20 and pressures in 40s.  Responded quickly and plan was to observe  in IR.  Just had recurrent vagal response with BP 95/51, HR 65 with pressure bag.  G-tube is placed and can be used tomorrow.  Getting 1L and drawing labs now.  Review of Systems: As per HPI; otherwise review of systems reviewed and negative.   Ambulatory Status:  Ambulates with a cane  COVID Vaccine Status:  Complete  Past Medical History:  Diagnosis Date  . Allergy   . Anemia   . Anginal pain ([place]) 2015  . Depression   . Dyslipidemia 05/10/2020  . Dysrhythmia 2015   SVT  . Essential hypertension 05/10/2020  . GERD (gastroesophageal reflux disease)   . Hiatal hernia   . History of degenerative disc disease   . History of nuclear stress test 07/27/2013   in care everywhere per cardiology note by dr Donata Clay:  test normal  . Hyperlipidemia   . Hypertension   . Hypothyroidism   . Hypothyroidism (acquired) 05/10/2020  . Intermittent palpitations   . MVP (mitral valve prolapse)    per pt dx 1980s,  last echo done 2016 by dr Donata Clay [facility] cardiology in [place]) and last cardiology visit 11-02-2014 in epic;   01-27-2019 per pt episodic palpitations and takes toprol, currently followed by pcp  . Narcolepsy   . OSA (obstructive sleep apnea)    Not on CPAP  . Osteoporosis    osteopenia  . TMJ arthralgia     Past Surgical History:  Procedure Laterality Date  . ABDOMINAL HYSTERECTOMY    .  BIOPSY  06/24/2018   Procedure: BIOPSY;  Surgeon: Daneil Dolin, MD;  [facility];  Service: Endoscopy;;  (gastric) (colon)  . BREAST CYST ASPIRATION    . COLONOSCOPY N/A 06/24/2018   Procedure: COLONOSCOPY;  Surgeon: Daneil Dolin, MD;  [facility];  Service: Endoscopy;  Laterality: N/A;  1:45pm  . ESOPHAGEAL DILATION  09/02/2019   Procedure: ESOPHAGEAL DILATION;  Surgeon: Mauri Pole, MD;  [facility];  Service: Endoscopy;;  . ESOPHAGEAL MANOMETRY N/A 09/02/2019   Procedure: ESOPHAGEAL MANOMETRY (EM);  Surgeon: Mauri Pole, MD;  [facility];  Service: Endoscopy;  Laterality: N/A;  . ESOPHAGOGASTRODUODENOSCOPY N/A 06/24/2018   Procedure: ESOPHAGOGASTRODUODENOSCOPY (EGD);  Surgeon: Daneil Dolin, MD;  [facility];  Service: Endoscopy;  Laterality: N/A;  . ESOPHAGOGASTRODUODENOSCOPY N/A 07/14/2019   Procedure: ESOPHAGOGASTRODUODENOSCOPY (EGD);  Surgeon: Daneil Dolin, MD;  [facility];  Service: Endoscopy;  Laterality: N/A;  3:00pm  . ESOPHAGOGASTRODUODENOSCOPY (EGD) WITH PROPOFOL N/A 09/02/2019   Procedure: ESOPHAGOGASTRODUODENOSCOPY (EGD) WITH PROPOFOL;  Surgeon: Mauri Pole, MD;  [facility];  Service: Endoscopy;  Laterality: N/A;   mano probe placement  . EYE SURGERY Bilateral    rk  . IR GASTROSTOMY TUBE MOD SED  05/10/2020  . MALONEY DILATION N/A 06/24/2018   Procedure: Venia Minks DILATION;  Surgeon: Daneil Dolin, MD;  [facility];  Service: Endoscopy;  Laterality: N/A;  . Venia Minks DILATION N/A 07/14/2019   Procedure: Venia Minks DILATION;  Surgeon: Daneil Dolin, MD;  [facility];  Service: Endoscopy;  Laterality: N/A;  . NISSEN FUNDOPLICATION  [id number]   takedown  . POLYPECTOMY  06/24/2018   Procedure: POLYPECTOMY;  Surgeon: Daneil Dolin, MD;  [facility];  Service: Endoscopy;;  colon   . RF ablation of cervical nerves    . ROTATOR CUFF REPAIR Right 11-21-2006  dr supple @[facility]     Social History   Socioeconomic History  . Marital status: Married    Spouse name: Not on file  . Number of children: Not on file  . Years of education: Not on file  . Highest education level: Not on file  Occupational History  . Occupation: retired  Tobacco Use  . Smoking status: Former Smoker    Packs/day: 1.50    Types: Cigarettes    Start date: 1970    Quit date: 07/1997    Years since quitting: 22.7  . Smokeless tobacco: Never Used  Vaping Use  . Vaping Use: Never used  Substance and Sexual Activity  . Alcohol use: Not Currently     Comment: none in many years  . Drug use: Never  . Sexual activity: Not on file  Other Topics Concern  . Not on file  Social History Narrative  . Not on file   Social Determinants of Health   Financial Resource Strain:   . Difficulty of Paying Living Expenses: Not on file  Food Insecurity:   . Worried About Charity fundraiser in the Last Year: Not on file  . Ran Out of Food in the Last Year: Not on file  Transportation Needs:   . Lack of Transportation (Medical): Not on file  . Lack of Transportation (Non-Medical): Not on file  Physical Activity:   . Days of Exercise per Week: Not on file  . Minutes of Exercise per Session: Not on file  Stress:   . Feeling of Stress : Not on  file  Social Connections:   . Frequency of Communication with Friends and Family: Not on file  . Frequency of Social Gatherings with Friends and Family: Not on file  . Attends Religious Services: Not on file  . Active Member of Clubs or Organizations: Not on file  . Attends Archivist Meetings: Not on file  . Marital Status: Not on file  Intimate Partner Violence:   . Fear of Current or Ex-Partner: Not on file  . Emotionally Abused: Not on file  . Physically Abused: Not on file  . Sexually Abused: Not on file    Allergies  Allergen Reactions  . Tetracyclines & Related Other (See Comments)    Pt developed gastritis while taking tetracycline  . Aspirin     bruises  . Norvasc [Amlodipine Besylate]     Made BP drop low  . Nsaids     bruising  . Dexilant [Dexlansoprazole] Diarrhea    Family History  Problem Relation Age of Onset  . Colon cancer Maternal Grandfather   . Colon cancer Paternal Grandmother   . Alzheimer's disease Mother   . Heart disease Father   . COPD Father   . Breast cancer Maternal Grandmother   . Liver cancer Brother   . Colon polyps Neg Hx   . Stomach cancer Neg Hx   . Esophageal cancer Neg Hx   . Rectal cancer Neg Hx     Prior to Admission medications     Medication Sig Start Date End Date Taking? Authorizing Provider  acetaminophen (TYLENOL) 500 MG tablet Take 500 mg by mouth every 8 (eight) hours as needed.   Yes [provider]  atorvastatin (LIPITOR) 20 MG tablet Take 20 mg by mouth at bedtime.    Yes [provider]  cholecalciferol (VITAMIN D3) 25 MCG (1000 UT) tablet Take 1,000 Units by mouth daily.   Yes [provider]  enalapril-hydrochlorothiazide (VASERETIC) 10-25 MG tablet Take 1 tablet by mouth daily. 03/06/18  Yes [provider]  FLUoxetine (PROZAC) 10 MG capsule Take 10 mg by mouth daily.  04/01/18  Yes [provider]  levothyroxine (SYNTHROID, LEVOTHROID) 50 MCG tablet Take 50 mcg by mouth daily before breakfast.  04/26/18  Yes [provider]  methylphenidate (RITALIN) 20 MG tablet Take 20 mg by mouth 3 (three) times daily.   Yes [provider]  metoCLOPramide (REGLAN) 10 MG tablet Take 10 mg by mouth 4 (four) times daily -  before meals and at bedtime.   Yes [provider]  metoprolol succinate (TOPROL-XL) 50 MG 24 hr tablet Take 50 mg by mouth at bedtime.  04/03/18  Yes [provider]  Multiple Vitamin (MULTIVITAMIN) tablet Take 1 tablet by mouth daily.   Yes [provider]  NON FORMULARY CPAP at night   Yes [provider]  valACYclovir (VALTREX) 1000 MG tablet Take 1,000 mg by mouth 2 (two) times daily as needed (outbreak).     [provider]    Physical Exam: Vitals:   05/10/20 1630 05/10/20 1745 05/10/20 1845 05/10/20 1927  BP: (!) 100/54  (!) 95/48 (!) 95/48  Pulse: 65  74 74  Resp: 20  20 17   Temp:  98.6 F (37 C)  98.4 F (36.9 C)  TempSrc:  Oral  Oral  SpO2: 100% 100% 91% 92%  Weight:      Height:         . General:  Appears calm and comfortable and is NAD; appears  weak and somewhat fatigued; periodic spasmotic abdominal pain with obvious discomfort . Eyes:  PERRL, EOMI, normal lids, iris . ENT:   grossly normal hearing, lips & tongue, mmm; appropriate dentition . Neck:  no LAD, masses or thyromegaly . Cardiovascular:  RRR, no m/r/g. No LE edema.  Marland Kitchen Respiratory:   CTA bilaterally with no wheezes/rales/rhonchi.  Normal respiratory effort. . Abdomen:  soft, generalized TTP with G-tube in place . Skin:  no rash or induration seen on limited exam . Musculoskeletal:  grossly normal tone BUE/BLE, good ROM, no bony abnormality . Psychiatric: blunted/fatigued mood and affect, speech fluent and appropriate, AOx3 . Neurologic:  CN 2-12 grossly intact, moves all extremities in coordinated fashion    Radiological Exams on Admission: CT ABDOMEN PELVIS W CONTRAST  Result Date: 05/10/2020 CLINICAL DATA:  Pain after gastrostomy tube placement. EXAM: CT ABDOMEN AND PELVIS WITH CONTRAST TECHNIQUE: Multidetector CT imaging of the abdomen and pelvis was performed using the standard protocol following bolus administration of intravenous contrast. CONTRAST:  135mL OMNIPAQUE IOHEXOL 300 MG/ML  SOLN COMPARISON:  CT abdomen 05/05/2020 FINDINGS: Lower chest: Bibasilar atelectasis. No significant pleural fluid. Again noted is a mild dilatation of the distal esophagus. Wall thickening near the GE junction and history of Nissen fundoplication takedown. Hepatobiliary: Normal appearance of the liver and gallbladder. Main portal veins are patent. No gallbladder distension. Pancreas: Unremarkable. No pancreatic ductal dilatation or surrounding inflammatory changes. Spleen: Normal in size.  New trace perisplenic fluid or ascites. Adrenals/Urinary Tract: Normal appearance of the adrenal glands. Mild fullness of the right renal pelvis is unchanged. Mild fullness in left renal pelvis without hydronephrosis. Evidence for left parapelvic renal cysts. Left ureter is mildly dilated. Fluid in the urinary bladder. Symmetric densities along the lateral inferior aspect of the bladder may be vascular in etiology and best seen on sequence  3, images 77 and 78. Stomach/Bowel: Stable wall thickening near the GE junction. Interval placement of a percutaneous gastrostomy tube with balloon retention. There is contrast within this retention balloon. Metallic structures in the stomach are compatible with T-fasteners from a gastropexy. There is contrast in the stomach related to the recent tube injection during tube placement. However, there is a space between the retention balloon and the anterior abdominal wall measuring roughly 1.6 cm. Normal appearance of the small bowel. No evidence for bowel obstruction. Gas and stool in the transverse colon. Vascular/Lymphatic: Atherosclerotic disease involving the abdominal aorta without aneurysm. Main visceral arteries are patent. Atherosclerotic disease in the bilateral iliac arteries. IVC and renal veins are patent. Iliac veins are patent. No significant abdominal or pelvic lymphadenopathy. Reproductive: Status post hysterectomy. No adnexal masses. Other: Moderate amount of free air in the anterior upper abdomen compatible with recent gastrostomy tube placement. New trace perisplenic ascites. Trace pelvic free fluid. Musculoskeletal: No acute bone abnormality. Disc space narrowing at L4-L5 and L5-S1. Degenerative facet arthropathy in the lower lumbar spine. IMPRESSION: 1. Expected changes following a percutaneous gastrostomy tube. Moderate amount of free air in the upper abdomen. Gastrostomy tube is positioned in the stomach but the retention balloon and stomach are not up against the abdominal wall. 2. Trace ascites in the pelvis and left upper quadrant. This may be related to recent gastrostomy. 3. Persistent wall thickening at the GE junction which is similar to the recent comparison examination. History of fundoplication and surgical takedown. 4. Mild fullness of the renal pelvis in both kidneys without hydronephrosis. Relatively symmetric densities along the lateral aspect of the bladder wall and suspect  this  is vascular in etiology based on the symmetry. Electronically Signed   By: Markus Daft M.D.   On: 05/10/2020 16:19   IR GASTROSTOMY TUBE MOD SED  Result Date: 05/10/2020 INDICATION: 70 year old female with history of dysphagia secondary to hiatal hernia status post Nissen fundoplication and surgical take-down of Nissen. She presents today for percutaneous enteric access. EXAM: PERC PLACEMENT GASTROSTOMY MEDICATIONS: Ancef 2 gm IV; Antibiotics were administered within 1 hour of the procedure. 1 mg atropine, 160 mcg neoepinephrine ANESTHESIA/SEDATION: The patient was continuously monitored during the procedure by the interventional radiology nurse under my direct supervision. Versed 2 mg IV; Fentanyl 75 mcg IV Moderate Sedation Time:  44 minutes CONTRAST:  28mL OMNIPAQUE IOHEXOL 300 MG/ML SOLN - administered into the gastric lumen. FLUOROSCOPY TIME:  Fluoroscopy Time: 1 minutes 42 seconds (3 mGy). COMPLICATIONS: SIR LEVEL B - Normal therapy, includes overnight admission for observation. During introduction of the gastrostomy tract dilator balloon, the patient experience pain an a vasovagal reaction. The patient had symptomatic sinus bradycardia with heart rate in the 20s to 30s and accompanying hypertension with systolic blood pressures is low as in the 50s. The patient was placed on defibrillator pads in the code team was called. Due to persistence of bradycardia confirmed by the defibrillator EKG, 1 mg of atropine was given intravenously. The patient's heart rate improved to the 80s. The code team administered a bolus of neoepinephrine and a bolus of 1 L normal saline was administered. Hypotension was resolved shortly thereafter. The patient tolerated the remainder of the procedure well. The patient was discharged home the same day. PROCEDURE: Informed written consent was obtained from the patient after a thorough discussion of the procedural risks, benefits and alternatives. All questions were addressed.  Maximal Sterile Barrier Technique was utilized including caps, mask, sterile gowns, sterile gloves, sterile drape, hand hygiene and skin antiseptic. A timeout was performed prior to the initiation of the procedure. The patient was placed on the procedure table in the supine position. Pre-procedure abdominal film confirmed visualization of the transverse colon. An angled 5-French catheter was passed through the nares into the stomach. The patient was prepped and draped in usual sterile fashion. The stomach was insufflated with air via the indwelling nasogastric tube. . Under fluoroscopy, a puncture site was selected and local analgesia achieved with 1% lidocaine infiltrated subcutaneously. Under fluoroscopic guidance, a gastropexy needle was passed into the stomach and the T-bar suture was released. Entry into the stomach was confirmed with fluoroscopy, aspiration of air, and injection of contrast material. This was repeated with an additional gastropexy suture (for a total of 2 fasteners). At the center of these gastropexy sutures, a dermatotomy was performed. An 18 gauge needle was passed into the stomach at the site of this dermatotomy, and position within the gastric lumen again confirmed under fluoroscopy using aspiration of air and contrast injection. An Amplatz guidewire was passed through this needle and intraluminal placement within the stomach was confirmed by fluoroscopy. The needle was removed. Over the guidewire, the percutaneous tract was dilated using a 10 mm non-compliant balloon. The balloon was deflated, then pushed into the gastric lumen followed in concert by the 20 Fr gastrostomy tube. The retention balloon of the percutaneous gastrostomy tube was inflated with 9 mL of normal saline admixed with 1 mL contrast. The tube was withdrawn until the retention balloon was at the edge of the gastric lumen. The external bumper was brought to the abdominal wall. Contrast was injected through the gastrostomy  tube, confirming intraluminal positioning. The patient tolerated the procedure well without any immediate post-procedural complications. IMPRESSION: Technically successful placement of 20 Fr gastrostomy tube. Ruthann Cancer, MD [facility] Electronically Signed   By: Ruthann Cancer MD   On: 05/10/2020 11:50    EKG: not done   Labs on Admission: I have personally reviewed the available labs and imaging studies at the time of the admission.  Pertinent labs:   K+ 3.3 WBC 8.8 Hgb 10.7; 12.0 this AM   Assessment/Plan Principal Problem:   Vasovagal response Active Problems:   Dysphagia   Hx of sleep apnea   Essential hypertension   Dyslipidemia   Narcolepsy   Depression   Hypothyroidism (acquired)   Vasovagal response -Patient with G-tube placement followed by marked repeated vagal response with HR as low at 19 and BP severely low -She stabilized but given the severity of her episodes, IR requested overnight observation in Progressive Care -She was bolused and appears to be hemodynamically stable at this time -Will continue gentle IVF hydration overnight  Dysphagia -She has had unfortunate complications since hiatal hernia repair -She improved transiently and G-tube was removed -Unfortunately, dysphagia and severe malnutrition recurred and so G-tube needed to be placed again -G-tube is now in place and can be used tomorrow at 10AM per IR -Nutrition consult requested  OSA -Continue CPAP  HLD -Continue Lipitor  HTN -Hold anti-hypertensives (Enalapril-HCTZ, Toprol XL) for now given severity of vagal responses   Depression/Narcolepsy -Continue Prozac -Hold Ritalin - she will not be performing tasks that require attention and focus as an inpatient  Hypothyroidism -Continue Synthroid at current dose for now    Note: This patient has been tested and is negative for the novel coronavirus COVID-19. The patient has  been fully vaccinated against COVID-19.    DVT prophylaxis:  Lovenox  Code Status:  DNR - confirmed with patient Family Communication: None present Disposition Plan:  The patient is from: home  Anticipated d/c is to: home without [facility] services   Anticipated d/c date will depend on clinical response to treatment, but possibly as early as tomorrow if she has excellent response to treatment  Patient is currently: acutely ill Consults called: IR Admission status:  It is my clinical opinion that referral for OBSERVATION is reasonable and necessary in this patient based on the above information provided. The aforementioned taken together are felt to place the patient at high risk for further clinical deterioration. However it is anticipated that the patient may be medically stable for discharge from the hospital within 24 to 48 hours.    Karmen Bongo MD [facility]   How to contact the [facility] Attending or Consulting provider [place] or covering provider during after hours [place], for this patient?  1. Check the care team in [facility] and look for a) attending/consulting [facility] provider listed and b) the [facility] team listed 2. Log into [email or web address] and use [facility]'s universal password to access. If you do not have the password, please contact the hospital operator. 3. Locate the [facility] provider you are looking for under [facility] and page to a number that you can be directly reached. 4. If you still have difficulty reaching the provider, please page the [facility] (Director on Call) for the Hospitalists listed on amion for assistance.   05/10/2020, 8:00 PM

## 2020-05-10 NOTE — Progress Notes (Signed)
Pt remains stable at this time. No complaints. Call to bed control to see about an assignment, they are working on getting a bed for her at this time and they will call me back as soon as there is one available.

## 2020-05-10 NOTE — Sedation Documentation (Signed)
Neosynephrine given by Claiborne Billings CRNA on [employer], total of 160 mcg.

## 2020-05-10 NOTE — Sedation Documentation (Signed)
Report given to [facility] RN

## 2020-05-10 NOTE — Sedation Documentation (Signed)
Dr Serafina Royals spoke to Daughter, Anderson Malta about events in IR. She came into [facility] briefly to see her mom. She will step out of the hospital and then come back to the Radiology waiting room to take her mom home.

## 2020-05-10 NOTE — Sedation Documentation (Signed)
Fluids (NS) open, free flow to pt

## 2020-05-10 NOTE — Sedation Documentation (Signed)
At 1335, just after I pulled the defib/pacer pads off the patient, heart rate went down to 45 and BP 56/33. Feels dizzy, is pale but not diaphoretic, awake, sp02 89. IV fluids wide open, bed flattened, 02 2L  applied..Then placed T[place]. Dr Serafina Royals notified. See orders. Anderson Malta PA in to see patient and Dr Serafina Royals also in to see patient. Labs drawn and sent. VS improved (see flowsheets) and patient feeling better and color coming back. Dr Serafina Royals spoke to patient and she is aware she will be staying overnight. I informed the daughter of what happened.

## 2020-05-10 NOTE — Progress Notes (Signed)
Assisted pt with transport to ct for stat scan. Pt denies pain at this time. She tolerated the ct scan well, vitals remain stable.

## 2020-05-10 NOTE — Sedation Documentation (Signed)
Given sips of water, tolerated well.

## 2020-05-10 NOTE — Sedation Documentation (Signed)
Report given to [place]

## 2020-05-10 NOTE — Sedation Documentation (Signed)
Pain in mid abdomen suddenly shot up to 8/10, pressure like. Patient distressed and moaning. Abdomen very tender but soft to palpation,. Dressing dry and intact. Anderson Malta PA aware. Patient can have oxycodone. Pain now 4/10, feels better and wonders if a spasm. Does not want pain meds now. Anderson Malta PA will order CT scan of abdomen.

## 2020-05-10 NOTE — Discharge Instructions (Addendum)
Moderate Conscious Sedation, Adult, Care After These instructions provide you with information about caring for yourself after your procedure. Your health care provider may also give you more specific instructions. Your treatment has been planned according to current medical practices, but problems sometimes occur. Call your health care provider if you have any problems or questions after your procedure. What can I expect after the procedure? After your procedure, it is common:  To feel sleepy for several hours.  To feel clumsy and have poor balance for several hours.  To have poor judgment for several hours.  To vomit if you eat too soon. Follow these instructions at home: For at least 24 hours after the procedure:   Do not: ? Participate in activities where you could fall or become injured. ? Drive. ? Use heavy machinery. ? Drink alcohol. ? Take sleeping pills or medicines that cause drowsiness. ? Make important decisions or sign legal documents. ? Take care of children on your own.  Rest. Eating and drinking  Follow the diet recommended by your health care provider.  If you vomit: ? Drink water, juice, or soup when you can drink without vomiting. ? Make sure you have little or no nausea before eating solid foods. General instructions  Have a responsible adult stay with you until you are awake and alert.  Take over-the-counter and prescription medicines only as told by your health care provider.  If you smoke, do not smoke without supervision.  Keep all follow-up visits as told by your health care provider. This is important. Contact a health care provider if:  You keep feeling nauseous or you keep vomiting.  You feel light-headed.  You develop a rash.  You have a fever. Get help right away if:  You have trouble breathing. This information is not intended to replace advice given to you by your health care provider. Make sure you discuss any questions you have  with your health care provider. Document Revised: 06/28/2017 Document Reviewed: 11/05/2015 Elsevier Patient Education  [place]. Gastrostomy Tube Replacement, Care After This sheet gives you information about how to care for yourself after your procedure. Your health care provider may also give you more specific instructions. If you have problems or questions, contact your health care provider. What can I expect after the procedure? After the procedure, it is common to have:  Mild pain in your abdomen.  A small amount of blood-tinged fluid leaking from the replacement site. Follow these instructions at home:   You may resume your normal level of activity.  You may resume your normal feedings.  Wash your hands before and after caring for your gastrostomy tube.  Check the skin around the tube site insertion for redness, a rash, swelling, drainage, or extra tissue growth. If you notice any of these, call your health care provider.  Care for your gastrostomy tube as you did before, or as directed by your health care provider. Contact a health care provider if:  You have a fever or chills.  You have redness or irritation near the insertion site.  You continue to have pain in the abdomen or leaking around your gastrostomy tube. Get help right away if:  You develop bleeding or a lot of discharge around the tube.  You have severe pain in the abdomen.  Your new tube is not working properly.  You are unable to get feedings into the tube.  Your tube comes out for any reason. Summary  Follow specific instructions as told  by your health care provider. If you have problems or questions, contact your health care provider.  After the procedure you may resume your normal level of activity and normal feedings.  Care for your gastrostomy tube as you did before, or as directed by your health care provider. This information is not intended to replace advice given to you by your  health care provider. Make sure you discuss any questions you have with your health care provider. Document Revised: 08/21/2017 Document Reviewed: 08/21/2017 Elsevier Patient Education  2020 [employer].

## 2020-05-10 NOTE — Sedation Documentation (Signed)
Pt vitals stable at this time. Pt still arousable on calling, pt never lost consciousness during event.

## 2020-05-11 DIAGNOSIS — I10 Essential (primary) hypertension: Secondary | ICD-10-CM | POA: Diagnosis not present

## 2020-05-11 DIAGNOSIS — E43 Unspecified severe protein-calorie malnutrition: Secondary | ICD-10-CM | POA: Diagnosis not present

## 2020-05-11 DIAGNOSIS — R1310 Dysphagia, unspecified: Secondary | ICD-10-CM | POA: Diagnosis not present

## 2020-05-11 DIAGNOSIS — R55 Syncope and collapse: Secondary | ICD-10-CM | POA: Diagnosis not present

## 2020-05-11 DIAGNOSIS — Z9889 Other specified postprocedural states: Secondary | ICD-10-CM | POA: Diagnosis not present

## 2020-05-11 DIAGNOSIS — R188 Other ascites: Secondary | ICD-10-CM | POA: Diagnosis not present

## 2020-05-11 DIAGNOSIS — Z6821 Body mass index (BMI) 21.0-21.9, adult: Secondary | ICD-10-CM | POA: Diagnosis not present

## 2020-05-11 DIAGNOSIS — Z931 Gastrostomy status: Secondary | ICD-10-CM | POA: Diagnosis not present

## 2020-05-11 DIAGNOSIS — R109 Unspecified abdominal pain: Secondary | ICD-10-CM | POA: Diagnosis not present

## 2020-05-11 LAB — CBC
HCT: 32.5 % — ABNORMAL LOW (ref 36.0–46.0)
Hemoglobin: 10.5 g/dL — ABNORMAL LOW (ref 12.0–15.0)
MCH: 29.3 pg (ref 26.0–34.0)
MCHC: 32.3 g/dL (ref 30.0–36.0)
MCV: 90.8 fL (ref 80.0–100.0)
Platelets: 277 10*3/uL (ref 150–400)
RBC: 3.58 MIL/uL — ABNORMAL LOW (ref 3.87–5.11)
RDW: 12 % (ref 11.5–15.5)
WBC: 11.1 10*3/uL — ABNORMAL HIGH (ref 4.0–10.5)
nRBC: 0 % (ref 0.0–0.2)

## 2020-05-11 LAB — GLUCOSE, CAPILLARY: Glucose-Capillary: 209 mg/dL — ABNORMAL HIGH (ref 70–99)

## 2020-05-11 LAB — BASIC METABOLIC PANEL
Anion gap: 9 (ref 5–15)
BUN: 13 mg/dL (ref 8–23)
CO2: 25 mmol/L (ref 22–32)
Calcium: 8.4 mg/dL — ABNORMAL LOW (ref 8.9–10.3)
Chloride: 104 mmol/L (ref 98–111)
Creatinine, Ser: 0.65 mg/dL (ref 0.44–1.00)
GFR, Estimated: >60 mL/min (ref >60)
Glucose, Bld: 93 mg/dL (ref 70–99)
Potassium: 3 mmol/L — ABNORMAL LOW (ref 3.5–5.1)
Sodium: 138 mmol/L (ref 135–145)

## 2020-05-11 LAB — HIV ANTIBODY (ROUTINE TESTING W REFLEX): HIV Screen 4th Generation wRfx: NONREACTIVE

## 2020-05-11 LAB — MAGNESIUM: Magnesium: 1.5 mg/dL — ABNORMAL LOW (ref 1.7–2.4)

## 2020-05-11 MED ORDER — OSMOLITE 1.2 CAL PO LIQD
1000.0000 mL | ORAL | Status: DC
  Filled 2020-05-11: qty 1000

## 2020-05-11 MED ORDER — POTASSIUM CHLORIDE 20 MEQ/15ML (10%) PO SOLN: 40.0000 meq | Freq: Once | ORAL | Status: DC

## 2020-05-11 MED ORDER — LEVOTHYROXINE SODIUM 50 MCG PO TABS
50.0000 ug | ORAL_TABLET | Freq: Every day | ORAL | Status: DC
  Administered 2020-05-11: 50 ug via ORAL

## 2020-05-11 MED ORDER — ADULT MULTIVITAMIN W/MINERALS CH
1.0000 | ORAL_TABLET | Freq: Every day | ORAL | Status: DC
  Administered 2020-05-11: 1
  Filled 2020-05-11: qty 1

## 2020-05-11 MED ORDER — POTASSIUM CHLORIDE 10 MEQ/100ML IV SOLN
10.0000 meq | INTRAVENOUS | Status: AC
  Administered 2020-05-11 (×6): 10 meq via INTRAVENOUS
  Filled 2020-05-11 (×4): qty 100

## 2020-05-11 MED ORDER — FREE WATER
110.0000 mL | Freq: Every day | Status: DC
  Administered 2020-05-11: 110 mL

## 2020-05-11 MED ORDER — OSMOLITE 1.2 CAL PO LIQD
237.0000 mL | Freq: Every day | ORAL | Status: DC
  Administered 2020-05-11: 237 mL
  Filled 2020-05-11 (×5): qty 237

## 2020-05-11 MED ORDER — DOCUSATE SODIUM 100 MG PO CAPS
100.0000 mg | ORAL_CAPSULE | Freq: Two times a day (BID) | ORAL | 0 refills | Status: DC
Start: 2020-05-11 — End: 2023-11-25

## 2020-05-11 MED ORDER — OSMOLITE 1.2 CAL PO LIQD: 237.0000 mL | Freq: Every day | ORAL | 0 refills | Status: DC

## 2020-05-11 MED ORDER — ONE-DAILY MULTI VITAMINS PO TABS: 1.0000 | ORAL_TABLET | Freq: Every day | ORAL | 0 refills | Status: DC

## 2020-05-11 MED ORDER — POTASSIUM CHLORIDE CRYS ER 20 MEQ PO TBCR: 40.0000 meq | EXTENDED_RELEASE_TABLET | ORAL | Status: DC

## 2020-05-11 MED ORDER — FREE WATER: 110.0000 mL | Freq: Every day | 0 refills | Status: DC

## 2020-05-11 NOTE — Progress Notes (Signed)
Pt is alert an oriented x4 at time of discharge. No needs or concerns at this time. Pt stable.VSS. Pt states they understand all instructions. Daughter at the bedside.

## 2020-05-11 NOTE — Progress Notes (Signed)
Referring Physician(s): Mauri Pole  Supervising Physician: Aletta Edouard  Patient Status:  [facility] In-pt  Chief Complaint: Abdominal pain   Subjective: Pt feeling better this am; she does have expected soreness at G tube site but denies fever,HA,CP, worsening dyspnea,N/V or bleeding; BP still a little soft   Allergies: Tetracyclines & related, Aspirin, Norvasc [amlodipine besylate], Nsaids, and Dexilant [dexlansoprazole]  Medications: Prior to Admission medications   Medication Sig Start Date End Date Taking? Authorizing Provider  acetaminophen (TYLENOL) 500 MG tablet Take 500 mg by mouth every 8 (eight) hours as needed.   Yes [provider]  atorvastatin (LIPITOR) 20 MG tablet Take 20 mg by mouth at bedtime.    Yes [provider]  cholecalciferol (VITAMIN D3) 25 MCG (1000 UT) tablet Take 1,000 Units by mouth daily.   Yes [provider]  enalapril-hydrochlorothiazide (VASERETIC) 10-25 MG tablet Take 1 tablet by mouth daily. 03/06/18  Yes [provider]  FLUoxetine (PROZAC) 10 MG capsule Take 10 mg by mouth daily.  04/01/18  Yes [provider]  hyoscyamine (LEVSIN SL) 0.125 MG SL tablet Place 0.125 mg under the tongue every 8 (eight) hours as needed for cramping.    Yes [provider]  levothyroxine (SYNTHROID, LEVOTHROID) 50 MCG tablet Take 50 mcg by mouth daily before breakfast.  04/26/18  Yes [provider]  methylphenidate (RITALIN) 20 MG tablet Take 20 mg by mouth 3 (three) times daily.   Yes [provider]  metoCLOPramide (REGLAN) 10 MG tablet Take 10 mg by mouth 4 (four) times daily -  before meals and at bedtime.   Yes [provider]  metoprolol succinate (TOPROL-XL) 50 MG 24 hr tablet Take 50 mg by mouth at bedtime.  04/03/18  Yes [provider]  Multiple Vitamin (MULTIVITAMIN) tablet Take 1 tablet by mouth daily.   Yes [provider]  NON FORMULARY CPAP at  night   Yes [provider]  valACYclovir (VALTREX) 1000 MG tablet Take 1,000 mg by mouth 2 (two) times daily as needed (for outbreak).    Yes [provider]     Vital Signs: BP (!) 93/49   Pulse 64   Temp 98.9 F (37.2 C) (Oral)   Resp 18   Ht 5' 1.5" (1.562 m)   Wt 119 lb 7.8 oz (54.2 kg)   SpO2 94%   BMI 22.21 kg/m   Physical Exam awake/alert; G tube intact and disc taut to skin, site mild-mod tender, no leaking noted, abd ND  Imaging: CT ABDOMEN PELVIS W CONTRAST  Result Date: 05/10/2020 CLINICAL DATA:  Pain after gastrostomy tube placement. EXAM: CT ABDOMEN AND PELVIS WITH CONTRAST TECHNIQUE: Multidetector CT imaging of the abdomen and pelvis was performed using the standard protocol following bolus administration of intravenous contrast. CONTRAST:  132mL OMNIPAQUE IOHEXOL 300 MG/ML  SOLN COMPARISON:  CT abdomen 05/05/2020 FINDINGS: Lower chest: Bibasilar atelectasis. No significant pleural fluid. Again noted is a mild dilatation of the distal esophagus. Wall thickening near the GE junction and history of Nissen fundoplication takedown. Hepatobiliary: Normal appearance of the liver and gallbladder. Main portal veins are patent. No gallbladder distension. Pancreas: Unremarkable. No pancreatic ductal dilatation or surrounding inflammatory changes. Spleen: Normal in size.  New trace perisplenic fluid or ascites. Adrenals/Urinary Tract: Normal appearance of the adrenal glands. Mild fullness of the right renal pelvis is unchanged. Mild fullness in left renal pelvis without hydronephrosis. Evidence for left parapelvic renal cysts. Left ureter is mildly dilated. Fluid  in the urinary bladder. Symmetric densities along the lateral inferior aspect of the bladder may be vascular in etiology and best seen on sequence 3, images 77 and 78. Stomach/Bowel: Stable wall thickening near the GE junction. Interval placement of a percutaneous gastrostomy tube with balloon retention. There is  contrast within this retention balloon. Metallic structures in the stomach are compatible with T-fasteners from a gastropexy. There is contrast in the stomach related to the recent tube injection during tube placement. However, there is a space between the retention balloon and the anterior abdominal wall measuring roughly 1.6 cm. Normal appearance of the small bowel. No evidence for bowel obstruction. Gas and stool in the transverse colon. Vascular/Lymphatic: Atherosclerotic disease involving the abdominal aorta without aneurysm. Main visceral arteries are patent. Atherosclerotic disease in the bilateral iliac arteries. IVC and renal veins are patent. Iliac veins are patent. No significant abdominal or pelvic lymphadenopathy. Reproductive: Status post hysterectomy. No adnexal masses. Other: Moderate amount of free air in the anterior upper abdomen compatible with recent gastrostomy tube placement. New trace perisplenic ascites. Trace pelvic free fluid. Musculoskeletal: No acute bone abnormality. Disc space narrowing at L4-L5 and L5-S1. Degenerative facet arthropathy in the lower lumbar spine. IMPRESSION: 1. Expected changes following a percutaneous gastrostomy tube. Moderate amount of free air in the upper abdomen. Gastrostomy tube is positioned in the stomach but the retention balloon and stomach are not up against the abdominal wall. 2. Trace ascites in the pelvis and left upper quadrant. This may be related to recent gastrostomy. 3. Persistent wall thickening at the GE junction which is similar to the recent comparison examination. History of fundoplication and surgical takedown. 4. Mild fullness of the renal pelvis in both kidneys without hydronephrosis. Relatively symmetric densities along the lateral aspect of the bladder wall and suspect this is vascular in etiology based on the symmetry. Electronically Signed   By: Robyn Ashley M.D.   On: 05/10/2020 16:19   IR GASTROSTOMY TUBE MOD SED  Result Date:  05/10/2020 INDICATION: 69 year old female with history of dysphagia secondary to hiatal hernia status post Nissen fundoplication and surgical take-down of Nissen. She presents today for percutaneous enteric access. EXAM: PERC PLACEMENT GASTROSTOMY MEDICATIONS: Ancef 2 gm IV; Antibiotics were administered within 1 hour of the procedure. 1 mg atropine, 160 mcg neoepinephrine ANESTHESIA/SEDATION: The patient was continuously monitored during the procedure by the interventional radiology nurse under my direct supervision. Versed 2 mg IV; Fentanyl 75 mcg IV Moderate Sedation Time:  44 minutes CONTRAST:  68mL OMNIPAQUE IOHEXOL 300 MG/ML SOLN - administered into the gastric lumen. FLUOROSCOPY TIME:  Fluoroscopy Time: 1 minutes 42 seconds (3 mGy). COMPLICATIONS: SIR LEVEL B - Normal therapy, includes overnight admission for observation. During introduction of the gastrostomy tract dilator balloon, the patient experience pain an a vasovagal reaction. The patient had symptomatic sinus bradycardia with heart rate in the 20s to 30s and accompanying hypertension with systolic blood pressures is low as in the 50s. The patient was placed on defibrillator pads in the code team was called. Due to persistence of bradycardia confirmed by the defibrillator EKG, 1 mg of atropine was given intravenously. The patient's heart rate improved to the 80s. The code team administered a bolus of neoepinephrine and a bolus of 1 L normal saline was administered. Hypotension was resolved shortly thereafter. The patient tolerated the remainder of the procedure well. The patient was discharged home the same day. PROCEDURE: Informed written consent was obtained from the patient after a thorough discussion of the  procedural risks, benefits and alternatives. All questions were addressed. Maximal Sterile Barrier Technique was utilized including caps, mask, sterile gowns, sterile gloves, sterile drape, hand hygiene and skin antiseptic. A timeout was  performed prior to the initiation of the procedure. The patient was placed on the procedure table in the supine position. Pre-procedure abdominal film confirmed visualization of the transverse colon. An angled 5-French catheter was passed through the nares into the stomach. The patient was prepped and draped in usual sterile fashion. The stomach was insufflated with air via the indwelling nasogastric tube. . Under fluoroscopy, a puncture site was selected and local analgesia achieved with 1% lidocaine infiltrated subcutaneously. Under fluoroscopic guidance, a gastropexy needle was passed into the stomach and the T-bar suture was released. Entry into the stomach was confirmed with fluoroscopy, aspiration of air, and injection of contrast material. This was repeated with an additional gastropexy suture (for a total of 2 fasteners). At the center of these gastropexy sutures, a dermatotomy was performed. An 18 gauge needle was passed into the stomach at the site of this dermatotomy, and position within the gastric lumen again confirmed under fluoroscopy using aspiration of air and contrast injection. An Amplatz guidewire was passed through this needle and intraluminal placement within the stomach was confirmed by fluoroscopy. The needle was removed. Over the guidewire, the percutaneous tract was dilated using a 10 mm non-compliant balloon. The balloon was deflated, then pushed into the gastric lumen followed in concert by the 20 Fr gastrostomy tube. The retention balloon of the percutaneous gastrostomy tube was inflated with 9 mL of normal saline admixed with 1 mL contrast. The tube was withdrawn until the retention balloon was at the edge of the gastric lumen. The external bumper was brought to the abdominal wall. Contrast was injected through the gastrostomy tube, confirming intraluminal positioning. The patient tolerated the procedure well without any immediate post-procedural complications. IMPRESSION: Technically  successful placement of 20 Fr gastrostomy tube. Robyn Cancer, MD [facility] Electronically Signed   By: Robyn Cancer MD   On: 05/10/2020 11:50    Labs:  CBC: Recent Labs    10/01/19 0404 05/10/20 0746 05/10/20 1349 05/11/20 0149  WBC 8.4 6.0 8.8 11.1*  HGB 9.7* 12.0 10.7* 10.5*  HCT 30.3* 37.4 33.5* 32.5*  PLT 302 345 281 277    COAGS: Recent Labs    05/10/20 0746  INR 1.1    BMP: Recent Labs    09/24/19 1429 09/24/19 1429 09/30/19 0416 09/30/19 0416 10/01/19 0404 05/03/20 1033 05/10/20 1349 05/11/20 0149  NA 140   < > 139   < > 142 137 140 138  K 4.0   < > 3.6   < > 3.3* 3.9 3.3* 3.0*  CL 101   < > 105   < > 104 97 104 104  CO2 30   < > 27   < > 29 34* 27 25  GLUCOSE 95   < > 109*   < > 94 88 97 93  BUN 14   < > 13   < > 13 19 16 13   CALCIUM 9.1   < > 8.2*   < > 8.5* 9.4 8.2* 8.4*  CREATININE 0.71   < > 0.70   < > 0.56 0.67 0.64 0.65  GFRNONAA >60   < > >60  --  >60  --  >60 >60  GFRAA >60  --  >60  --  >60  --   --   --    < > =  values in this interval not displayed.    LIVER FUNCTION TESTS: Recent Labs    09/24/19 1429 05/03/20 1033  BILITOT 0.7 0.4  AST 25 25  ALT 15 19  ALKPHOS 110 93  PROT 7.3 7.6  ALBUMIN 4.2 4.4    Assessment and Plan: 69 year old female with history of dysphagia secondary to hiatal hernia status post Nissen fundoplication and surgical take-down of Nissen; s/p G tube placement 10/12 with subsequent vasovagal episode, tx with atropine neo epi, NS bolus; afebrile; BP 93/49; WBC 11.1(8.8), hgb 10.5(10.7), creat nl; recent CT was reviewed by Dr. Serafina Royals; ok to use G tube for feeds; pt will need retention sutures removed in 2 weeks- will schedule visit; appreciate [facility] medical management/admission of pt- other plans as per their service   Electronically Signed: D. Rowe Robert, PA-C 05/11/2020, 10:39 AM   I spent a total of 15 minutes at the the patient's bedside AND  on the patient's hospital floor or unit, greater than 50% of which was counseling/coordinating care for percutaneous gastrostomy tube    Patient ID: Robyn Ashley, female   DOB: 10-07-1950, 69 y.o.   MRN: [id number]

## 2020-05-11 NOTE — Progress Notes (Signed)
SATURATION QUALIFICATIONS: (This note is used to comply with regulatory documentation for home oxygen)  Patient Saturations on Room Air at Rest = 99%  Patient Saturations on Room Air while Ambulating = 95%  Patient Saturations on 0 Liters of oxygen while Ambulating =99%  Please briefly explain why patient needs home oxygen:

## 2020-05-11 NOTE — Progress Notes (Signed)
Initial Nutrition Assessment  DOCUMENTATION CODES:   Not applicable  INTERVENTION:   Initiate bolus feedings:  1 can (237 ml) Osmolite 1.2 via PEG 5 times daily  55 ml free water flush before and after each feeding administration  Tube feeding regimen provides 1425 kcal (98% of needs), 66 grams of protein, and 975 ml of H2O. Total free water: 1525 ml/ day  -MVI with minerals daily  NUTRITION DIAGNOSIS:   Inadequate oral intake related to dysphagia as evidenced by per patient/family report.  GOAL:   Patient will meet greater than or equal to 90% of their needs  MONITOR:   PO intake, Diet advancement, Labs, Weight trends, TF tolerance, Skin, I & O's  REASON FOR ASSESSMENT:   Consult Enteral/tube feeding initiation and management  ASSESSMENT:   Robyn Ashley is a 69 y.o. female with medical history significant of OSA not on CPAP; narcolepsy; hypothyroidism; HTN; HLD; and depression  presenting for G-tube placement.  She had a hiatal hernia repair in 01/2019 and she was never ble to advance her diet.  She had numerous surgery and it was wrapped too tight.  They undid the Nissen in July but still unable to eat/drink formula at times. She had an esophageal dilation a week ago with no improvement.  She had a feeding tube from March through August and had been doing well so they removed it.  However, she is back to losing weight and they were planning to replace it.  They are planning to look at what's causing her esophageal dysmotility.  They went to place the tube and she developed severe abdominal pain and she got very dizzy and her pressure dropped.  "and that's where we are now."  She is still having abdominal pain - which is unusual for her.  She is not currently feeling dizzt or lightheaded.  Pt admitted with vasovagal response and dysphagia.   10/12- s/p PEG  Reviewed I/O's: -424 ml x 24 hours  UOP: 550 ml x 24 hours  Spoke with pt and daughter at bedside. Pt reports a  general decline in health over the past 3 months after hiatal hernia repair. Pt reports that she had a PEG placed previously secondary to dysphagia, however, this was removed on 03/03/20 secondary to improved intake. Pt shares that she was consuming 5 cans of Osmolite 1.5 formula and 2-3 Boost Breeze supplements daily after PEG was removed. However, pt with increased difficulty swallowing. She shares foods and liquids would "come right back up" after attempting to swallow.   Pt shares that when she previously had PEG she had a regimen of 5 cans of Osmolite 1.5 5 times daily with 100 ml free water flush during each administration. She shared that these feedings were time consuming for her, but is willing to return to bolus feedings at home. RD offered to increase volume of feedings to reduce frequency of feeds, however, pt complained of being full after previous regimen and would prefer to stick with it. Pt was also taking a prenatal vitamin PTA, sharing that her hair was thinning and falling out, however, this improved after taking it regularly. She is interesting in having a MVI crushed via PEG, as she is unsure if she can swallow pills at this time- RD reviewed MVI with minerals and liquid MVI on formulary; pt would prefer MVI due to more complete vitamin content.   Per pt, she estimates she has lost about 30 pounds over the past 6 months, however, this is  not consistent with wt hx. Noted wt has been stable over the past 6 months.  Pt and daughter very familiar with PEG care and feeding administration. They had no further questions for RD at this time.   Medications reviewed and include colace and reglan.   Labs reviewed: K: 3.0. Mg: 1.5.   NUTRITION - FOCUSED PHYSICAL EXAM:    Most Recent Value  Orbital Region No depletion  Upper Arm Region No depletion  Thoracic and Lumbar Region No depletion  Buccal Region No depletion  Temple Region No depletion  Clavicle Bone Region No depletion  Clavicle  and Acromion Bone Region No depletion  Scapular Bone Region No depletion  Dorsal Hand No depletion  Patellar Region No depletion  Anterior Thigh Region No depletion  Posterior Calf Region No depletion  Edema (RD Assessment) None  Hair Reviewed  Eyes Reviewed  Mouth Reviewed  Skin Reviewed  Nails Reviewed       Diet Order:   Diet Order            Diet clear liquid Room service appropriate? Yes; Fluid consistency: Thin  Diet effective now                 EDUCATION NEEDS:   Education needs have been addressed  Skin:  Skin Assessment: Skin Integrity Issues: Skin Integrity Issues:: Other (Comment) Other: s/p PEG placement  Last BM:  05/09/20  Height:   Ht Readings from Last 1 Encounters:  05/10/20 5' 1.5" (1.562 m)    Weight:   Wt Readings from Last 1 Encounters:  05/11/20 54.2 kg    Ideal Body Weight:  48.9 kg  BMI:  Body mass index is 22.21 kg/m.  Estimated Nutritional Needs:   Kcal:  1450-1650  Protein:  70-85 grams  Fluid:  > 1.5 L    Loistine Chance, RD, LDN, [place] Registered Dietitian II Certified Diabetes Care and Education Specialist Please refer to [facility] for RD and/or RD on-call/weekend/after hours pager

## 2020-05-11 NOTE — Discharge Summary (Signed)
Physician Discharge Summary  Robyn Ashley DJS:[id number] DOB: 1950-12-15 DOA: 05/10/2020  PCP: Redmond School, MD  Admit date: 05/10/2020 Discharge date: 05/11/2020  Recommendations for Outpatient Follow-up:  1. Discharge to home on tube feeds. 1 can Osmolite 1.2 cal 237 cc 5 x  daily. Flush with 110 cc water 5 x daily. 2. Follow up with PCP in 7-10 days.  3. Follow up with GI as directed. 4. Follow up with Interventional Radiology in 2 weeks as directed for  removal of sutures.   Discharge Diagnoses: Principal diagnosis is #1 1. Vasovagal response to G-tube placement 2. Dysphagia: Due to complications of hiatal hernia repair/dysmotility/requires G tube placement 3. OSA - on CPAP 4. Hyperlipidemia 5. Hypertension 6. Depression 7. Narcolepsy 8. Hypothyroidism  Discharge Condition: Fair  Disposition: Home  Diet recommendation: Osmolite 1.2 cal 237 cc 5 x daily with 110 cc water flushes 5 x daily  Filed Weights   05/10/20 0728 05/11/20 0300  Weight: 52.2 kg 54.2 kg    History of present illness:  Robyn Ashley is a 69 y.o. female with medical history significant of OSA not on CPAP; narcolepsy; hypothyroidism; HTN; HLD; and depression  presenting for G-tube placement.  She had a hiatal hernia repair in 01/2019 and she was never ble to advance her diet.  She had numerous surgery and it was wrapped too tight.  They undid the Nissen in July but still unable to eat/drink formula at times. She had an esophageal dilation a week ago with no improvement.  She had a feeding tube from March through August and had been doing well so they removed it.  However, she is back to losing weight and they were planning to replace it.  They are planning to look at what's causing her esophageal dysmotility.  They went to place the tube and she developed severe abdominal pain and she got very dizzy and her pressure dropped.  "and that's where we are now."  She is still having abdominal pain - which is  unusual for her.  She is not currently feeling dizzt or lightheaded.   ED Course:  Per IR:  recurrent hiatal hernia and persistent distal esophageal stricture s/p Nissen takedown and gastrostomy tube placement by surgery on 3.02.20. Per surgical notes an 14 Fr Stamm gastrostomy tube. Per noted from Dr. Kae Heller in general surgery patient has been able to maintain her weight with supplement feedings without the use of the gastrostomy tube andthe gastrotomy tube was removed. Patient was seen by GI on 10.5.21 and found to have severe protein energy malnutrition.   G-tube replacement, h/o stricture with serial dilations.  Had a vagal response, HR 20 and pressures in 40s.  Responded quickly and plan was to observe in IR.  Just had recurrent vagal response with BP 95/51, HR 65 with pressure bag.  G-tube is placed and can be used tomorrow.  Getting 1L and drawing labs now.  Hospital Course:  [facility] were consulted to admit the patient for overnight observation. The patient was given IV fluids. She has been ambulated in the halls today without difficulty. She will be discharged to home to continue tube feeds as directed.  Today's assessment: S: The patient is resting comfortably. No new complaints. O: Vitals:  Vitals:   05/11/20 1124 05/11/20 1607  BP:    Pulse:    Resp:    Temp: 99.5 F (37.5 C) 99.4 F (37.4 C)  SpO2:     Exam:  Constitutional:  . The  patient is awake, alert, and oriented x 3. No acute distress. Eyes:  . pupils and irises appear normal . Normal lids and conjunctivae ENMT:  . grossly normal hearing  . Lips appear normal . external ears, nose appear normal . Oropharynx: mucosa, tongue,posterior pharynx appear normal Neck:  . neck appears normal, no masses, normal ROM, supple . no thyromegaly Respiratory:  . No increased work of breathing. . No wheezes, rales, or rhonchi . No tactile fremitus Cardiovascular:  . Regular rate and rhythm . No murmurs,  ectopy, or gallups. . No lateral PMI. No thrills. Abdomen:  . Abdomen is soft, non-tender, non-distended . No hernias, masses, or organomegaly . Normoactive bowel sounds.  Musculoskeletal:  . No cyanosis, clubbing, or edema Skin:  . No rashes, lesions, ulcers . palpation of skin: no induration or nodules Neurologic:  . CN 2-12 intact . Sensation all 4 extremities intact Psychiatric:  . Mental status o Mood, affect appropriate o Orientation to person, place, time  . judgment and insight appear intact  Discharge Instructions  Discharge Instructions    Activity as tolerated - No restrictions   Complete by: As directed    Call MD for:  difficulty breathing, headache or visual disturbances   Complete by: As directed    Call MD for:  redness, tenderness, or signs of infection (pain, swelling, redness, odor or green/yellow discharge around incision site)   Complete by: As directed    Call MD for:  severe uncontrolled pain   Complete by: As directed    Call MD for:  temperature >100.4   Complete by: As directed    Diet - low sodium heart healthy   Complete by: As directed    Discharge instructions   Complete by: As directed    Discharge to home on tube feeds. 1 can Osmolite 1.2 cal 237 cc 5 x daily. Flush with 110 cc water 5 x daily. Follow up with PCP in 7-10 days.  Follow up with GI as directed. Follow up with Interventional Radiology in 2 weeks as directed for removal of sutures.   Increase activity slowly   Complete by: As directed    No wound care   Complete by: As directed      Allergies as of 05/11/2020      Reactions   Tetracyclines & Related Other (See Comments)   Pt developed gastritis while taking tetracycline   Aspirin    bruises   Norvasc [amlodipine Besylate]    Made BP drop low   Nsaids    bruising   Dexilant [dexlansoprazole] Diarrhea      Medication List    TAKE these medications   acetaminophen 500 MG tablet Commonly known as: TYLENOL Take  500 mg by mouth every 8 (eight) hours as needed.   atorvastatin 20 MG tablet Commonly known as: LIPITOR Take 20 mg by mouth at bedtime.   cholecalciferol 25 MCG (1000 UNIT) tablet Commonly known as: VITAMIN D3 Take 1,000 Units by mouth daily.   docusate sodium 100 MG capsule Commonly known as: COLACE Take 1 capsule (100 mg total) by mouth 2 (two) times daily.   enalapril-hydrochlorothiazide 10-25 MG tablet Commonly known as: VASERETIC Take 1 tablet by mouth daily.   feeding supplement (OSMOLITE 1.2 CAL) Liqd Place 237 mLs into feeding tube 5 (five) times daily.   FLUoxetine 10 MG capsule Commonly known as: PROZAC Take 10 mg by mouth daily.   free water Soln Place 110 mLs into feeding tube 5 (five) times  daily.   hyoscyamine 0.125 MG SL tablet Commonly known as: LEVSIN SL Place 0.125 mg under the tongue every 8 (eight) hours as needed for cramping.   levothyroxine 50 MCG tablet Commonly known as: SYNTHROID Take 50 mcg by mouth daily before breakfast.   methylphenidate 20 MG tablet Commonly known as: RITALIN Take 20 mg by mouth 3 (three) times daily.   metoprolol succinate 50 MG 24 hr tablet Commonly known as: TOPROL-XL Take 50 mg by mouth at bedtime.   multivitamin tablet Place 1 tablet into feeding tube daily. What changed: how to take this   NON FORMULARY CPAP at night   Reglan 10 MG tablet Generic drug: metoCLOPramide Take 10 mg by mouth 4 (four) times daily -  before meals and at bedtime.   valACYclovir 1000 MG tablet Commonly known as: VALTREX Take 1,000 mg by mouth 2 (two) times daily as needed (for outbreak).      Allergies  Allergen Reactions  . Tetracyclines & Related Other (See Comments)    Pt developed gastritis while taking tetracycline  . Aspirin     bruises  . Norvasc [Amlodipine Besylate]     Made BP drop low  . Nsaids     bruising  . Dexilant [Dexlansoprazole] Diarrhea    The results of significant diagnostics from this  hospitalization (including imaging, microbiology, ancillary and laboratory) are listed below for reference.    Significant Diagnostic Studies: CT CHEST W CONTRAST  Result Date: 05/06/2020 CLINICAL DATA:  Weight loss and dysphagia post Nissen fundoplication. EGD dilatation September 2021. EXAM: CT CHEST WITH CONTRAST TECHNIQUE: Multidetector CT imaging of the chest was performed during intravenous contrast administration. CONTRAST:  71mL OMNIPAQUE IOHEXOL 300 MG/ML  SOLN COMPARISON:  01/29/2019 and recent abdominal CT today. FINDINGS: Cardiovascular: Borderline cardiomegaly. Thoracic aorta is normal in caliber. There is mild calcified plaque over the thoracic aorta. Remaining vascular structures are unremarkable. Mediastinum/Nodes: No evidence of mediastinal or hilar adenopathy. Contrast present throughout the esophagus which may be due to dysmotility, reflux or degree of gastroesophageal junction obstruction. Mild wall thickening of the distal esophagus/gastroesophageal junction likely partially related to patient's Nissen fundoplication. Lungs/Pleura: Lungs are adequately inflated. There is mild emphysematous disease. No focal airspace process or effusion. Airways are normal. Upper Abdomen: Calcified plaque over the abdominal aorta. No acute findings. Musculoskeletal: No focal abnormality. IMPRESSION: 1. No acute cardiopulmonary disease. 2. Contrast throughout the esophagus which may be due to dysmotility, reflux or degree of relative obstruction at the gastroesophageal junction. Mild wall thickening of the distal esophagus/gastroesophageal junction likely due in part to patient's Nissen fundoplication. 3. Emphysema and aortic atherosclerosis. Aortic Atherosclerosis (ICD10-I70.0) and Emphysema (ICD10-J43.9). Electronically Signed   By: Marin Olp M.D.   On: 05/06/2020 12:50   CT ABDOMEN W CONTRAST  Result Date: 05/05/2020 CLINICAL DATA:  Chronic dysphagia. Possible hiatal hernia. Possible G-tube  placement. Gastroesophageal reflux disease. Hypertension. Prior esophageal dilatation. Ex-smoker. Emphysema. EXAM: CT ABDOMEN WITH CONTRAST TECHNIQUE: Multidetector CT imaging of the abdomen was performed using the standard protocol following bolus administration of intravenous contrast. CONTRAST:  17mL OMNIPAQUE IOHEXOL 300 MG/ML  SOLN COMPARISON:  06/12/2019 upper GI. No comparison abdominal CT. Chest CT 01/29/2019. Esophagram of 02/29/2020 also reviewed. FINDINGS: Lower chest: Clear lung bases. Normal heart size without pericardial or pleural effusion. Mild distal esophageal wall thickening on 01/02, felt to be similar to 01/29/2019 CT. Tiny hiatal hernia. Fluid level in the distal esophagus. Hepatobiliary: Normal liver. Normal gallbladder, without biliary ductal dilatation. Pancreas: Normal, without mass  or ductal dilatation. Spleen: Normal in size, without focal abnormality. Adrenals/Urinary Tract: Normal adrenal glands. Normal right kidney. Left renal sinus cysts. No hydronephrosis. Stomach/Bowel: At the level of the gastroesophageal junction, there is ill definition of fat planes, including on 08/02. This is presumably postoperative, in this patient who is status post prior fundoplication. The more distal stomach is within normal limits. Normal abdominal large and small bowel loops. Vascular/Lymphatic: Aortic atherosclerosis. Infrarenal aortic minimal dilatation at 2.1 cm. No retroperitoneal or retrocrural adenopathy. Other: No ascites. Musculoskeletal: Degenerative disc disease at L4-5 and less so L5-S1. Mild convex left lumbar spine curvature. IMPRESSION: 1. Small hiatal hernia with distal esophageal wall thickening. Contrast level in the distal esophagus suggests dysmotility or gastroesophageal reflux. 2. Ill definition of fat planes at the region of the gastroesophageal junction, at the site of prior Nissan fundoplication. Likely postoperative. 3.  No acute abdominal process. 4.  Aortic Atherosclerosis  (ICD10-I70.0). Electronically Signed   By: Abigail Miyamoto M.D.   On: 05/05/2020 15:34   CT ABDOMEN PELVIS W CONTRAST  Result Date: 05/10/2020 CLINICAL DATA:  Pain after gastrostomy tube placement. EXAM: CT ABDOMEN AND PELVIS WITH CONTRAST TECHNIQUE: Multidetector CT imaging of the abdomen and pelvis was performed using the standard protocol following bolus administration of intravenous contrast. CONTRAST:  162mL OMNIPAQUE IOHEXOL 300 MG/ML  SOLN COMPARISON:  CT abdomen 05/05/2020 FINDINGS: Lower chest: Bibasilar atelectasis. No significant pleural fluid. Again noted is a mild dilatation of the distal esophagus. Wall thickening near the GE junction and history of Nissen fundoplication takedown. Hepatobiliary: Normal appearance of the liver and gallbladder. Main portal veins are patent. No gallbladder distension. Pancreas: Unremarkable. No pancreatic ductal dilatation or surrounding inflammatory changes. Spleen: Normal in size.  New trace perisplenic fluid or ascites. Adrenals/Urinary Tract: Normal appearance of the adrenal glands. Mild fullness of the right renal pelvis is unchanged. Mild fullness in left renal pelvis without hydronephrosis. Evidence for left parapelvic renal cysts. Left ureter is mildly dilated. Fluid in the urinary bladder. Symmetric densities along the lateral inferior aspect of the bladder may be vascular in etiology and best seen on sequence 3, images 77 and 78. Stomach/Bowel: Stable wall thickening near the GE junction. Interval placement of a percutaneous gastrostomy tube with balloon retention. There is contrast within this retention balloon. Metallic structures in the stomach are compatible with T-fasteners from a gastropexy. There is contrast in the stomach related to the recent tube injection during tube placement. However, there is a space between the retention balloon and the anterior abdominal wall measuring roughly 1.6 cm. Normal appearance of the small bowel. No evidence for bowel  obstruction. Gas and stool in the transverse colon. Vascular/Lymphatic: Atherosclerotic disease involving the abdominal aorta without aneurysm. Main visceral arteries are patent. Atherosclerotic disease in the bilateral iliac arteries. IVC and renal veins are patent. Iliac veins are patent. No significant abdominal or pelvic lymphadenopathy. Reproductive: Status post hysterectomy. No adnexal masses. Other: Moderate amount of free air in the anterior upper abdomen compatible with recent gastrostomy tube placement. New trace perisplenic ascites. Trace pelvic free fluid. Musculoskeletal: No acute bone abnormality. Disc space narrowing at L4-L5 and L5-S1. Degenerative facet arthropathy in the lower lumbar spine. IMPRESSION: 1. Expected changes following a percutaneous gastrostomy tube. Moderate amount of free air in the upper abdomen. Gastrostomy tube is positioned in the stomach but the retention balloon and stomach are not up against the abdominal wall. 2. Trace ascites in the pelvis and left upper quadrant. This may be related to recent  gastrostomy. 3. Persistent wall thickening at the GE junction which is similar to the recent comparison examination. History of fundoplication and surgical takedown. 4. Mild fullness of the renal pelvis in both kidneys without hydronephrosis. Relatively symmetric densities along the lateral aspect of the bladder wall and suspect this is vascular in etiology based on the symmetry. Electronically Signed   By: Markus Daft M.D.   On: 05/10/2020 16:19   IR GASTROSTOMY TUBE MOD SED  Result Date: 05/10/2020 INDICATION: 69 year old female with history of dysphagia secondary to hiatal hernia status post Nissen fundoplication and surgical take-down of Nissen. She presents today for percutaneous enteric access. EXAM: PERC PLACEMENT GASTROSTOMY MEDICATIONS: Ancef 2 gm IV; Antibiotics were administered within 1 hour of the procedure. 1 mg atropine, 160 mcg neoepinephrine ANESTHESIA/SEDATION:  The patient was continuously monitored during the procedure by the interventional radiology nurse under my direct supervision. Versed 2 mg IV; Fentanyl 75 mcg IV Moderate Sedation Time:  44 minutes CONTRAST:  63mL OMNIPAQUE IOHEXOL 300 MG/ML SOLN - administered into the gastric lumen. FLUOROSCOPY TIME:  Fluoroscopy Time: 1 minutes 42 seconds (3 mGy). COMPLICATIONS: SIR LEVEL B - Normal therapy, includes overnight admission for observation. During introduction of the gastrostomy tract dilator balloon, the patient experience pain an a vasovagal reaction. The patient had symptomatic sinus bradycardia with heart rate in the 20s to 30s and accompanying hypertension with systolic blood pressures is low as in the 50s. The patient was placed on defibrillator pads in the code team was called. Due to persistence of bradycardia confirmed by the defibrillator EKG, 1 mg of atropine was given intravenously. The patient's heart rate improved to the 80s. The code team administered a bolus of neoepinephrine and a bolus of 1 L normal saline was administered. Hypotension was resolved shortly thereafter. The patient tolerated the remainder of the procedure well. The patient was discharged home the same day. PROCEDURE: Informed written consent was obtained from the patient after a thorough discussion of the procedural risks, benefits and alternatives. All questions were addressed. Maximal Sterile Barrier Technique was utilized including caps, mask, sterile gowns, sterile gloves, sterile drape, hand hygiene and skin antiseptic. A timeout was performed prior to the initiation of the procedure. The patient was placed on the procedure table in the supine position. Pre-procedure abdominal film confirmed visualization of the transverse colon. An angled 5-French catheter was passed through the nares into the stomach. The patient was prepped and draped in usual sterile fashion. The stomach was insufflated with air via the indwelling nasogastric  tube. . Under fluoroscopy, a puncture site was selected and local analgesia achieved with 1% lidocaine infiltrated subcutaneously. Under fluoroscopic guidance, a gastropexy needle was passed into the stomach and the T-bar suture was released. Entry into the stomach was confirmed with fluoroscopy, aspiration of air, and injection of contrast material. This was repeated with an additional gastropexy suture (for a total of 2 fasteners). At the center of these gastropexy sutures, a dermatotomy was performed. An 18 gauge needle was passed into the stomach at the site of this dermatotomy, and position within the gastric lumen again confirmed under fluoroscopy using aspiration of air and contrast injection. An Amplatz guidewire was passed through this needle and intraluminal placement within the stomach was confirmed by fluoroscopy. The needle was removed. Over the guidewire, the percutaneous tract was dilated using a 10 mm non-compliant balloon. The balloon was deflated, then pushed into the gastric lumen followed in concert by the 20 Fr gastrostomy tube. The retention balloon of  the percutaneous gastrostomy tube was inflated with 9 mL of normal saline admixed with 1 mL contrast. The tube was withdrawn until the retention balloon was at the edge of the gastric lumen. The external bumper was brought to the abdominal wall. Contrast was injected through the gastrostomy tube, confirming intraluminal positioning. The patient tolerated the procedure well without any immediate post-procedural complications. IMPRESSION: Technically successful placement of 20 Fr gastrostomy tube. Ruthann Cancer, MD [facility] Electronically Signed   By: Ruthann Cancer MD   On: 05/10/2020 11:50    Microbiology: Recent Results (from the past 240 hour(s))  SARS CORONAVIRUS 2 (TAT 6-24 HRS) Nasopharyngeal Nasopharyngeal Swab     Status: None   Collection Time: 05/07/20  2:11 PM    Specimen: Nasopharyngeal Swab  Result Value Ref Range Status   SARS Coronavirus 2 NEGATIVE NEGATIVE Final    Comment: (NOTE) SARS-CoV-2 target nucleic acids are NOT DETECTED.  The SARS-CoV-2 RNA is generally detectable in upper and lower respiratory specimens during the acute phase of infection. Negative results do not preclude SARS-CoV-2 infection, do not rule out co-infections with other pathogens, and should not be used as the sole basis for treatment or other patient management decisions. Negative results must be combined with clinical observations, patient history, and epidemiological information. The expected result is Negative.  Fact Sheet for Patients: SugarRoll.be  Fact Sheet for Healthcare Providers: https://www.woods-mathews.com/  This test is not yet approved or cleared by the [place] FDA and  has been authorized for detection and/or diagnosis of SARS-CoV-2 by FDA under an Emergency Use Authorization (EUA). This EUA will remain  in effect (meaning this test can be used) for the duration of the COVID-19 declaration under Se ction 564(b)(1) of the Act, 21 U.S.C. section 360bbb-3(b)(1), unless the authorization is terminated or revoked sooner.  Performed at [facility], [place] [address], [place], [place] [address]      Labs: Basic Metabolic Panel: Recent Labs  Lab 05/10/20 1349 05/11/20 0149  NA 140 138  K 3.3* 3.0*  CL 104 104  CO2 27 25  GLUCOSE 97 93  BUN 16 13  CREATININE 0.64 0.65  CALCIUM 8.2* 8.4*  MG  --  1.5*   Liver Function Tests: No results for input(s): AST, ALT, ALKPHOS, BILITOT, PROT, ALBUMIN in the last 168 hours. No results for input(s): LIPASE, AMYLASE in the last 168 hours. No results for input(s): AMMONIA in the last 168 hours. CBC: Recent Labs  Lab 05/10/20 0746 05/10/20 1349 05/11/20 0149  WBC 6.0 8.8 11.1*  HGB 12.0 10.7* 10.5*  HCT 37.4 33.5* 32.5*  MCV 91.4 92.8 90.8    PLT 345 281 277   Cardiac Enzymes: No results for input(s): CKTOTAL, CKMB, CKMBINDEX, TROPONINI in the last 168 hours. BNP: BNP (last 3 results) No results for input(s): BNP in the last 8760 hours.  ProBNP (last 3 results) No results for input(s): PROBNP in the last 8760 hours.  CBG: Recent Labs  Lab 05/11/20 1609  GLUCAP 209*    Principal Problem:   Vasovagal response Active Problems:   Dysphagia   Hx of sleep apnea   Essential hypertension   Dyslipidemia   Narcolepsy   Depression   Hypothyroidism (acquired)   Time coordinating discharge: 38 minutes  Signed:        Leonda Cristo, DO [facility]  05/11/2020, 5:01 PM

## 2020-05-12 ENCOUNTER — Ambulatory Visit (HOSPITAL_COMMUNITY): Payer: Medicare Other

## 2020-05-17 DIAGNOSIS — E440 Moderate protein-calorie malnutrition: Secondary | ICD-10-CM | POA: Diagnosis not present

## 2020-05-17 DIAGNOSIS — E7849 Other hyperlipidemia: Secondary | ICD-10-CM | POA: Diagnosis not present

## 2020-05-17 DIAGNOSIS — R55 Syncope and collapse: Secondary | ICD-10-CM | POA: Diagnosis not present

## 2020-05-17 DIAGNOSIS — E063 Autoimmune thyroiditis: Secondary | ICD-10-CM | POA: Diagnosis not present

## 2020-05-17 DIAGNOSIS — Z23 Encounter for immunization: Secondary | ICD-10-CM | POA: Diagnosis not present

## 2020-05-17 DIAGNOSIS — Z6821 Body mass index (BMI) 21.0-21.9, adult: Secondary | ICD-10-CM | POA: Diagnosis not present

## 2020-05-17 DIAGNOSIS — Z931 Gastrostomy status: Secondary | ICD-10-CM | POA: Diagnosis not present

## 2020-05-17 DIAGNOSIS — E876 Hypokalemia: Secondary | ICD-10-CM | POA: Diagnosis not present

## 2020-05-17 DIAGNOSIS — K224 Dyskinesia of esophagus: Secondary | ICD-10-CM | POA: Diagnosis not present

## 2020-05-18 DIAGNOSIS — E063 Autoimmune thyroiditis: Secondary | ICD-10-CM | POA: Diagnosis not present

## 2020-05-18 DIAGNOSIS — E40 Kwashiorkor: Secondary | ICD-10-CM | POA: Diagnosis not present

## 2020-05-18 DIAGNOSIS — E878 Other disorders of electrolyte and fluid balance, not elsewhere classified: Secondary | ICD-10-CM | POA: Diagnosis not present

## 2020-05-18 DIAGNOSIS — E440 Moderate protein-calorie malnutrition: Secondary | ICD-10-CM | POA: Diagnosis not present

## 2020-05-18 DIAGNOSIS — M8580 Other specified disorders of bone density and structure, unspecified site: Secondary | ICD-10-CM | POA: Diagnosis not present

## 2020-05-18 DIAGNOSIS — E559 Vitamin D deficiency, unspecified: Secondary | ICD-10-CM | POA: Diagnosis not present

## 2020-05-19 DIAGNOSIS — Z931 Gastrostomy status: Secondary | ICD-10-CM | POA: Diagnosis not present

## 2020-05-19 DIAGNOSIS — R1310 Dysphagia, unspecified: Secondary | ICD-10-CM | POA: Diagnosis not present

## 2020-05-20 ENCOUNTER — Other Ambulatory Visit (HOSPITAL_COMMUNITY): Payer: Self-pay | Admitting: Surgery

## 2020-05-20 DIAGNOSIS — Z931 Gastrostomy status: Secondary | ICD-10-CM

## 2020-05-26 ENCOUNTER — Ambulatory Visit
Admission: RE | Admit: 2020-05-26 | Discharge: 2020-05-26 | Disposition: A | Discharge status: Home / Self Care | Payer: Medicare Other | Source: Ambulatory Visit | Attending: Radiology | Admitting: Radiology

## 2020-05-26 ENCOUNTER — Encounter: Payer: Self-pay | Admitting: *Deleted

## 2020-05-26 DIAGNOSIS — E43 Unspecified severe protein-calorie malnutrition: Secondary | ICD-10-CM

## 2020-05-26 DIAGNOSIS — Z4802 Encounter for removal of sutures: Secondary | ICD-10-CM | POA: Diagnosis not present

## 2020-05-26 HISTORY — PX: IR RADIOLOGIST EVAL & MGMT: IMG5224

## 2020-05-26 NOTE — Progress Notes (Signed)
Interventional Radiology Progress Note  69 yo female SP percutaneous gastrostomy 05/10/20.  She presents for removal of the retention sutures at the site of T-tack.   She denies any problems.   Skin in good repair.  No sign of infection.  The balloon at the g-tube is snug.   The t-tack sutures were cut and removed.   Signed,  Dulcy Fanny. Earleen Newport, DO

## 2020-05-27 DIAGNOSIS — Z23 Encounter for immunization: Secondary | ICD-10-CM | POA: Diagnosis not present

## 2020-05-28 DIAGNOSIS — F329 Major depressive disorder, single episode, unspecified: Secondary | ICD-10-CM | POA: Diagnosis not present

## 2020-05-28 DIAGNOSIS — E063 Autoimmune thyroiditis: Secondary | ICD-10-CM | POA: Diagnosis not present

## 2020-05-28 DIAGNOSIS — I10 Essential (primary) hypertension: Secondary | ICD-10-CM | POA: Diagnosis not present

## 2020-05-28 DIAGNOSIS — E7849 Other hyperlipidemia: Secondary | ICD-10-CM | POA: Diagnosis not present

## 2020-06-28 DIAGNOSIS — E063 Autoimmune thyroiditis: Secondary | ICD-10-CM | POA: Diagnosis not present

## 2020-06-28 DIAGNOSIS — F329 Major depressive disorder, single episode, unspecified: Secondary | ICD-10-CM | POA: Diagnosis not present

## 2020-06-28 DIAGNOSIS — I10 Essential (primary) hypertension: Secondary | ICD-10-CM | POA: Diagnosis not present

## 2020-06-28 DIAGNOSIS — E7849 Other hyperlipidemia: Secondary | ICD-10-CM | POA: Diagnosis not present

## 2020-06-29 ENCOUNTER — Ambulatory Visit: Payer: Medicare Other | Admitting: Gastroenterology

## 2020-06-29 DIAGNOSIS — R1310 Dysphagia, unspecified: Secondary | ICD-10-CM | POA: Diagnosis not present

## 2020-07-29 DIAGNOSIS — I10 Essential (primary) hypertension: Secondary | ICD-10-CM | POA: Diagnosis not present

## 2020-07-29 DIAGNOSIS — E063 Autoimmune thyroiditis: Secondary | ICD-10-CM | POA: Diagnosis not present

## 2020-07-29 DIAGNOSIS — E7849 Other hyperlipidemia: Secondary | ICD-10-CM | POA: Diagnosis not present

## 2020-07-29 DIAGNOSIS — F329 Major depressive disorder, single episode, unspecified: Secondary | ICD-10-CM | POA: Diagnosis not present

## 2020-08-29 DIAGNOSIS — F329 Major depressive disorder, single episode, unspecified: Secondary | ICD-10-CM | POA: Diagnosis not present

## 2020-08-29 DIAGNOSIS — I10 Essential (primary) hypertension: Secondary | ICD-10-CM | POA: Diagnosis not present

## 2020-08-29 DIAGNOSIS — E063 Autoimmune thyroiditis: Secondary | ICD-10-CM | POA: Diagnosis not present

## 2020-08-29 DIAGNOSIS — E7849 Other hyperlipidemia: Secondary | ICD-10-CM | POA: Diagnosis not present

## 2020-09-26 DIAGNOSIS — Z1331 Encounter for screening for depression: Secondary | ICD-10-CM | POA: Diagnosis not present

## 2020-09-26 DIAGNOSIS — K219 Gastro-esophageal reflux disease without esophagitis: Secondary | ICD-10-CM | POA: Diagnosis not present

## 2020-09-26 DIAGNOSIS — Z1389 Encounter for screening for other disorder: Secondary | ICD-10-CM | POA: Diagnosis not present

## 2020-09-26 DIAGNOSIS — Z7984 Long term (current) use of oral hypoglycemic drugs: Secondary | ICD-10-CM | POA: Diagnosis not present

## 2020-09-26 DIAGNOSIS — I700 Atherosclerosis of aorta: Secondary | ICD-10-CM | POA: Diagnosis not present

## 2020-09-26 DIAGNOSIS — Z6822 Body mass index (BMI) 22.0-22.9, adult: Secondary | ICD-10-CM | POA: Diagnosis not present

## 2020-09-26 DIAGNOSIS — R6889 Other general symptoms and signs: Secondary | ICD-10-CM | POA: Diagnosis not present

## 2020-09-26 DIAGNOSIS — K220 Achalasia of cardia: Secondary | ICD-10-CM | POA: Diagnosis not present

## 2020-09-26 DIAGNOSIS — F329 Major depressive disorder, single episode, unspecified: Secondary | ICD-10-CM | POA: Diagnosis not present

## 2020-09-26 DIAGNOSIS — I10 Essential (primary) hypertension: Secondary | ICD-10-CM | POA: Diagnosis not present

## 2020-09-26 DIAGNOSIS — F909 Attention-deficit hyperactivity disorder, unspecified type: Secondary | ICD-10-CM | POA: Diagnosis not present

## 2020-09-26 DIAGNOSIS — E7849 Other hyperlipidemia: Secondary | ICD-10-CM | POA: Diagnosis not present

## 2020-09-26 DIAGNOSIS — E063 Autoimmune thyroiditis: Secondary | ICD-10-CM | POA: Diagnosis not present

## 2020-11-02 DIAGNOSIS — Z931 Gastrostomy status: Secondary | ICD-10-CM | POA: Diagnosis not present

## 2020-11-02 DIAGNOSIS — R1310 Dysphagia, unspecified: Secondary | ICD-10-CM | POA: Diagnosis not present

## 2020-11-26 DIAGNOSIS — E063 Autoimmune thyroiditis: Secondary | ICD-10-CM | POA: Diagnosis not present

## 2020-11-26 DIAGNOSIS — I10 Essential (primary) hypertension: Secondary | ICD-10-CM | POA: Diagnosis not present

## 2020-11-26 DIAGNOSIS — E7849 Other hyperlipidemia: Secondary | ICD-10-CM | POA: Diagnosis not present

## 2020-11-26 DIAGNOSIS — F329 Major depressive disorder, single episode, unspecified: Secondary | ICD-10-CM | POA: Diagnosis not present

## 2020-12-27 DIAGNOSIS — F329 Major depressive disorder, single episode, unspecified: Secondary | ICD-10-CM | POA: Diagnosis not present

## 2020-12-27 DIAGNOSIS — E063 Autoimmune thyroiditis: Secondary | ICD-10-CM | POA: Diagnosis not present

## 2020-12-27 DIAGNOSIS — I10 Essential (primary) hypertension: Secondary | ICD-10-CM | POA: Diagnosis not present

## 2020-12-27 DIAGNOSIS — E7849 Other hyperlipidemia: Secondary | ICD-10-CM | POA: Diagnosis not present

## 2020-12-28 DIAGNOSIS — R1310 Dysphagia, unspecified: Secondary | ICD-10-CM | POA: Diagnosis not present

## 2021-01-05 ENCOUNTER — Telehealth: Payer: Self-pay | Admitting: Gastroenterology

## 2021-01-05 DIAGNOSIS — R1310 Dysphagia, unspecified: Secondary | ICD-10-CM

## 2021-01-05 NOTE — Telephone Encounter (Signed)
Referral received via proficient for patient to have esophageal manometry with Dr. Silverio Decamp.  Please advise.

## 2021-01-09 NOTE — Telephone Encounter (Signed)
Ok to schedule esophageal manometry for dysphagia. Thanks

## 2021-01-10 ENCOUNTER — Other Ambulatory Visit: Payer: Self-pay

## 2021-01-10 NOTE — Telephone Encounter (Signed)
Referral for esophageal manometry received. Provider Dr Jens Som, MD Practice name [facility] [phone] Fax [phone] Patient contacted and instructed for procedure that is scheduled for 02/06/21 at 10:30. Written instructions provided.

## 2021-01-26 DIAGNOSIS — E063 Autoimmune thyroiditis: Secondary | ICD-10-CM | POA: Diagnosis not present

## 2021-01-26 DIAGNOSIS — E782 Mixed hyperlipidemia: Secondary | ICD-10-CM | POA: Diagnosis not present

## 2021-01-26 DIAGNOSIS — F329 Major depressive disorder, single episode, unspecified: Secondary | ICD-10-CM | POA: Diagnosis not present

## 2021-01-26 DIAGNOSIS — I10 Essential (primary) hypertension: Secondary | ICD-10-CM | POA: Diagnosis not present

## 2021-02-03 DIAGNOSIS — F331 Major depressive disorder, recurrent, moderate: Secondary | ICD-10-CM | POA: Diagnosis not present

## 2021-02-03 DIAGNOSIS — Z6823 Body mass index (BMI) 23.0-23.9, adult: Secondary | ICD-10-CM | POA: Diagnosis not present

## 2021-02-03 DIAGNOSIS — I10 Essential (primary) hypertension: Secondary | ICD-10-CM | POA: Diagnosis not present

## 2021-02-03 DIAGNOSIS — F909 Attention-deficit hyperactivity disorder, unspecified type: Secondary | ICD-10-CM | POA: Diagnosis not present

## 2021-02-03 DIAGNOSIS — E063 Autoimmune thyroiditis: Secondary | ICD-10-CM | POA: Diagnosis not present

## 2021-02-03 DIAGNOSIS — G47419 Narcolepsy without cataplexy: Secondary | ICD-10-CM | POA: Diagnosis not present

## 2021-02-03 DIAGNOSIS — I700 Atherosclerosis of aorta: Secondary | ICD-10-CM | POA: Diagnosis not present

## 2021-02-03 DIAGNOSIS — Z0001 Encounter for general adult medical examination with abnormal findings: Secondary | ICD-10-CM | POA: Diagnosis not present

## 2021-02-06 ENCOUNTER — Encounter (HOSPITAL_COMMUNITY): Payer: Self-pay | Admitting: Gastroenterology

## 2021-02-06 ENCOUNTER — Encounter (HOSPITAL_COMMUNITY)
Admission: RE | Disposition: A | Discharge status: Home / Self Care | Payer: Self-pay | Source: Home / Self Care | Attending: Gastroenterology

## 2021-02-06 ENCOUNTER — Ambulatory Visit (HOSPITAL_COMMUNITY)
Admission: RE | Admit: 2021-02-06 | Discharge: 2021-02-06 | Disposition: A | Discharge status: Home / Self Care | Payer: Medicare Other | Attending: Gastroenterology | Admitting: Gastroenterology

## 2021-02-06 DIAGNOSIS — R1310 Dysphagia, unspecified: Secondary | ICD-10-CM | POA: Diagnosis not present

## 2021-02-06 DIAGNOSIS — Z538 Procedure and treatment not carried out for other reasons: Secondary | ICD-10-CM | POA: Diagnosis not present

## 2021-02-06 HISTORY — PX: ESOPHAGEAL MANOMETRY: SHX5429

## 2021-02-06 SURGERY — MANOMETRY, ESOPHAGUS
Anesthesia: Choice

## 2021-02-06 MED ORDER — LIDOCAINE VISCOUS HCL 2 % MT SOLN
OROMUCOSAL | Status: AC
  Filled 2021-02-06: qty 15

## 2021-02-06 SURGICAL SUPPLY — 2 items
FACESHIELD LNG OPTICON STERILE (SAFETY) IMPLANT
GLOVE BIO SURGEON STRL SZ8 (GLOVE) ×6 IMPLANT

## 2021-02-06 NOTE — Progress Notes (Signed)
Esophageal Manometry attempted.  Was able to pass probe into Left nare down to stomach without complication.  Patient unable to tolerated probe in esophagus, gag reflex "too strong" per patient.  Patient requested probe to be removed.  Removed without complication.  Patient tolerated removal of probe well.  Patient did not want to proceed with procedure.  Patient has had probe placed under anesthesia with Esophagogastroduodenscopy in the past.  Patient would like to discuss this option with MD.  Message sent to Dr. Silverio Decamp.

## 2021-02-10 DIAGNOSIS — Z20822 Contact with and (suspected) exposure to covid-19: Secondary | ICD-10-CM | POA: Diagnosis not present

## 2021-02-26 DIAGNOSIS — F329 Major depressive disorder, single episode, unspecified: Secondary | ICD-10-CM | POA: Diagnosis not present

## 2021-02-26 DIAGNOSIS — E063 Autoimmune thyroiditis: Secondary | ICD-10-CM | POA: Diagnosis not present

## 2021-02-26 DIAGNOSIS — I10 Essential (primary) hypertension: Secondary | ICD-10-CM | POA: Diagnosis not present

## 2021-02-26 DIAGNOSIS — E782 Mixed hyperlipidemia: Secondary | ICD-10-CM | POA: Diagnosis not present

## 2021-03-20 DIAGNOSIS — F909 Attention-deficit hyperactivity disorder, unspecified type: Secondary | ICD-10-CM | POA: Diagnosis not present

## 2021-03-29 DIAGNOSIS — E7849 Other hyperlipidemia: Secondary | ICD-10-CM | POA: Diagnosis not present

## 2021-03-29 DIAGNOSIS — F329 Major depressive disorder, single episode, unspecified: Secondary | ICD-10-CM | POA: Diagnosis not present

## 2021-03-29 DIAGNOSIS — I10 Essential (primary) hypertension: Secondary | ICD-10-CM | POA: Diagnosis not present

## 2021-05-04 ENCOUNTER — Other Ambulatory Visit: Payer: Self-pay | Admitting: Internal Medicine

## 2021-05-04 DIAGNOSIS — Z1231 Encounter for screening mammogram for malignant neoplasm of breast: Secondary | ICD-10-CM

## 2021-05-04 DIAGNOSIS — Z23 Encounter for immunization: Secondary | ICD-10-CM | POA: Diagnosis not present

## 2021-05-15 ENCOUNTER — Ambulatory Visit: Payer: Medicare Other

## 2021-05-25 ENCOUNTER — Telehealth: Payer: Self-pay

## 2021-05-25 NOTE — Telephone Encounter (Signed)
Atc patient. LVM for patent requesting that she bring her SD card from her CPAP machine to her OV appt tomorrow. Advised her to call us back if she had questions.

## 2021-05-26 ENCOUNTER — Ambulatory Visit: Payer: Medicare Other | Admitting: Pulmonary Disease

## 2021-07-05 DIAGNOSIS — R1319 Other dysphagia: Secondary | ICD-10-CM | POA: Diagnosis not present

## 2021-07-06 DIAGNOSIS — Z20822 Contact with and (suspected) exposure to covid-19: Secondary | ICD-10-CM | POA: Diagnosis not present

## 2021-08-03 DIAGNOSIS — R1319 Other dysphagia: Secondary | ICD-10-CM | POA: Diagnosis not present

## 2021-08-03 DIAGNOSIS — K209 Esophagitis, unspecified without bleeding: Secondary | ICD-10-CM | POA: Diagnosis not present

## 2021-08-28 DIAGNOSIS — E063 Autoimmune thyroiditis: Secondary | ICD-10-CM | POA: Diagnosis not present

## 2021-08-28 DIAGNOSIS — F909 Attention-deficit hyperactivity disorder, unspecified type: Secondary | ICD-10-CM | POA: Diagnosis not present

## 2021-08-28 DIAGNOSIS — I10 Essential (primary) hypertension: Secondary | ICD-10-CM | POA: Diagnosis not present

## 2021-08-28 DIAGNOSIS — G47419 Narcolepsy without cataplexy: Secondary | ICD-10-CM | POA: Diagnosis not present

## 2021-08-30 DIAGNOSIS — R634 Abnormal weight loss: Secondary | ICD-10-CM | POA: Diagnosis not present

## 2021-08-30 DIAGNOSIS — Z9889 Other specified postprocedural states: Secondary | ICD-10-CM | POA: Diagnosis not present

## 2021-08-30 DIAGNOSIS — R1310 Dysphagia, unspecified: Secondary | ICD-10-CM | POA: Diagnosis not present

## 2021-08-30 DIAGNOSIS — K220 Achalasia of cardia: Secondary | ICD-10-CM | POA: Diagnosis not present

## 2021-09-09 DIAGNOSIS — Z20822 Contact with and (suspected) exposure to covid-19: Secondary | ICD-10-CM | POA: Diagnosis not present

## 2021-09-15 DIAGNOSIS — Z20822 Contact with and (suspected) exposure to covid-19: Secondary | ICD-10-CM | POA: Diagnosis not present

## 2021-09-19 DIAGNOSIS — Z79890 Hormone replacement therapy (postmenopausal): Secondary | ICD-10-CM | POA: Diagnosis not present

## 2021-09-19 DIAGNOSIS — E785 Hyperlipidemia, unspecified: Secondary | ICD-10-CM | POA: Diagnosis present

## 2021-09-19 DIAGNOSIS — K660 Peritoneal adhesions (postprocedural) (postinfection): Secondary | ICD-10-CM | POA: Diagnosis not present

## 2021-09-19 DIAGNOSIS — K220 Achalasia of cardia: Secondary | ICD-10-CM | POA: Diagnosis present

## 2021-09-19 DIAGNOSIS — Z90710 Acquired absence of both cervix and uterus: Secondary | ICD-10-CM | POA: Diagnosis not present

## 2021-09-19 DIAGNOSIS — Z79899 Other long term (current) drug therapy: Secondary | ICD-10-CM | POA: Diagnosis not present

## 2021-09-19 DIAGNOSIS — K222 Esophageal obstruction: Secondary | ICD-10-CM | POA: Diagnosis present

## 2021-09-19 DIAGNOSIS — E079 Disorder of thyroid, unspecified: Secondary | ICD-10-CM | POA: Diagnosis present

## 2021-09-20 DIAGNOSIS — E785 Hyperlipidemia, unspecified: Secondary | ICD-10-CM | POA: Diagnosis present

## 2021-09-20 DIAGNOSIS — Z90710 Acquired absence of both cervix and uterus: Secondary | ICD-10-CM | POA: Diagnosis not present

## 2021-09-20 DIAGNOSIS — E079 Disorder of thyroid, unspecified: Secondary | ICD-10-CM | POA: Diagnosis present

## 2021-09-20 DIAGNOSIS — K220 Achalasia of cardia: Secondary | ICD-10-CM | POA: Diagnosis present

## 2021-09-20 DIAGNOSIS — Z79890 Hormone replacement therapy (postmenopausal): Secondary | ICD-10-CM | POA: Diagnosis not present

## 2021-09-20 DIAGNOSIS — Z79899 Other long term (current) drug therapy: Secondary | ICD-10-CM | POA: Diagnosis not present

## 2021-09-20 DIAGNOSIS — K222 Esophageal obstruction: Secondary | ICD-10-CM | POA: Diagnosis present

## 2021-09-29 DIAGNOSIS — Z48815 Encounter for surgical aftercare following surgery on the digestive system: Secondary | ICD-10-CM | POA: Diagnosis not present

## 2021-09-29 DIAGNOSIS — Z888 Allergy status to other drugs, medicaments and biological substances status: Secondary | ICD-10-CM | POA: Diagnosis not present

## 2021-09-29 DIAGNOSIS — Z880 Allergy status to penicillin: Secondary | ICD-10-CM | POA: Diagnosis not present

## 2021-09-29 DIAGNOSIS — Z886 Allergy status to analgesic agent status: Secondary | ICD-10-CM | POA: Diagnosis not present

## 2021-09-29 DIAGNOSIS — Z9889 Other specified postprocedural states: Secondary | ICD-10-CM | POA: Diagnosis not present

## 2021-10-04 DIAGNOSIS — Z20822 Contact with and (suspected) exposure to covid-19: Secondary | ICD-10-CM | POA: Diagnosis not present

## 2021-10-14 DIAGNOSIS — Z20822 Contact with and (suspected) exposure to covid-19: Secondary | ICD-10-CM | POA: Diagnosis not present

## 2021-11-01 DIAGNOSIS — Z431 Encounter for attention to gastrostomy: Secondary | ICD-10-CM | POA: Diagnosis not present

## 2021-11-13 DIAGNOSIS — Z20822 Contact with and (suspected) exposure to covid-19: Secondary | ICD-10-CM | POA: Diagnosis not present

## 2021-11-16 DIAGNOSIS — Z20822 Contact with and (suspected) exposure to covid-19: Secondary | ICD-10-CM | POA: Diagnosis not present

## 2021-11-22 DIAGNOSIS — Z20822 Contact with and (suspected) exposure to covid-19: Secondary | ICD-10-CM | POA: Diagnosis not present

## 2021-12-02 DIAGNOSIS — Z20822 Contact with and (suspected) exposure to covid-19: Secondary | ICD-10-CM | POA: Diagnosis not present

## 2021-12-03 DIAGNOSIS — Z20822 Contact with and (suspected) exposure to covid-19: Secondary | ICD-10-CM | POA: Diagnosis not present

## 2021-12-04 DIAGNOSIS — Z20822 Contact with and (suspected) exposure to covid-19: Secondary | ICD-10-CM | POA: Diagnosis not present

## 2021-12-06 DIAGNOSIS — Z20828 Contact with and (suspected) exposure to other viral communicable diseases: Secondary | ICD-10-CM | POA: Diagnosis not present

## 2021-12-06 DIAGNOSIS — Z1152 Encounter for screening for COVID-19: Secondary | ICD-10-CM | POA: Diagnosis not present

## 2022-02-23 DIAGNOSIS — I700 Atherosclerosis of aorta: Secondary | ICD-10-CM | POA: Diagnosis not present

## 2022-02-23 DIAGNOSIS — Z6825 Body mass index (BMI) 25.0-25.9, adult: Secondary | ICD-10-CM | POA: Diagnosis not present

## 2022-02-23 DIAGNOSIS — G47419 Narcolepsy without cataplexy: Secondary | ICD-10-CM | POA: Diagnosis not present

## 2022-02-23 DIAGNOSIS — F909 Attention-deficit hyperactivity disorder, unspecified type: Secondary | ICD-10-CM | POA: Diagnosis not present

## 2022-02-23 DIAGNOSIS — I10 Essential (primary) hypertension: Secondary | ICD-10-CM | POA: Diagnosis not present

## 2022-03-22 DIAGNOSIS — I10 Essential (primary) hypertension: Secondary | ICD-10-CM | POA: Diagnosis not present

## 2022-03-22 DIAGNOSIS — F909 Attention-deficit hyperactivity disorder, unspecified type: Secondary | ICD-10-CM | POA: Diagnosis not present

## 2022-03-22 DIAGNOSIS — T50905A Adverse effect of unspecified drugs, medicaments and biological substances, initial encounter: Secondary | ICD-10-CM | POA: Diagnosis not present

## 2022-03-22 DIAGNOSIS — Z6825 Body mass index (BMI) 25.0-25.9, adult: Secondary | ICD-10-CM | POA: Diagnosis not present

## 2022-03-22 DIAGNOSIS — E663 Overweight: Secondary | ICD-10-CM | POA: Diagnosis not present

## 2022-04-20 DIAGNOSIS — Z23 Encounter for immunization: Secondary | ICD-10-CM | POA: Diagnosis not present

## 2022-04-20 DIAGNOSIS — D518 Other vitamin B12 deficiency anemias: Secondary | ICD-10-CM | POA: Diagnosis not present

## 2022-04-20 DIAGNOSIS — G2589 Other specified extrapyramidal and movement disorders: Secondary | ICD-10-CM | POA: Diagnosis not present

## 2022-04-20 DIAGNOSIS — I700 Atherosclerosis of aorta: Secondary | ICD-10-CM | POA: Diagnosis not present

## 2022-04-20 DIAGNOSIS — E039 Hypothyroidism, unspecified: Secondary | ICD-10-CM | POA: Diagnosis not present

## 2022-04-20 DIAGNOSIS — T50905A Adverse effect of unspecified drugs, medicaments and biological substances, initial encounter: Secondary | ICD-10-CM | POA: Diagnosis not present

## 2022-04-20 DIAGNOSIS — I10 Essential (primary) hypertension: Secondary | ICD-10-CM | POA: Diagnosis not present

## 2022-04-20 DIAGNOSIS — G47429 Narcolepsy in conditions classified elsewhere without cataplexy: Secondary | ICD-10-CM | POA: Diagnosis not present

## 2022-04-20 DIAGNOSIS — Z0001 Encounter for general adult medical examination with abnormal findings: Secondary | ICD-10-CM | POA: Diagnosis not present

## 2022-04-20 DIAGNOSIS — Z1331 Encounter for screening for depression: Secondary | ICD-10-CM | POA: Diagnosis not present

## 2022-04-20 DIAGNOSIS — Z6825 Body mass index (BMI) 25.0-25.9, adult: Secondary | ICD-10-CM | POA: Diagnosis not present

## 2022-04-20 DIAGNOSIS — E063 Autoimmune thyroiditis: Secondary | ICD-10-CM | POA: Diagnosis not present

## 2022-04-25 DIAGNOSIS — E559 Vitamin D deficiency, unspecified: Secondary | ICD-10-CM | POA: Diagnosis not present

## 2022-04-25 DIAGNOSIS — I10 Essential (primary) hypertension: Secondary | ICD-10-CM | POA: Diagnosis not present

## 2022-04-25 DIAGNOSIS — E7849 Other hyperlipidemia: Secondary | ICD-10-CM | POA: Diagnosis not present

## 2022-04-25 DIAGNOSIS — Z0001 Encounter for general adult medical examination with abnormal findings: Secondary | ICD-10-CM | POA: Diagnosis not present

## 2022-06-08 ENCOUNTER — Other Ambulatory Visit: Payer: Self-pay | Admitting: Internal Medicine

## 2022-06-08 DIAGNOSIS — Z1231 Encounter for screening mammogram for malignant neoplasm of breast: Secondary | ICD-10-CM

## 2022-06-13 DIAGNOSIS — F909 Attention-deficit hyperactivity disorder, unspecified type: Secondary | ICD-10-CM | POA: Diagnosis not present

## 2022-06-13 DIAGNOSIS — G47429 Narcolepsy in conditions classified elsewhere without cataplexy: Secondary | ICD-10-CM | POA: Diagnosis not present

## 2022-06-13 DIAGNOSIS — K219 Gastro-esophageal reflux disease without esophagitis: Secondary | ICD-10-CM | POA: Diagnosis not present

## 2022-06-13 DIAGNOSIS — G2571 Drug induced akathisia: Secondary | ICD-10-CM | POA: Diagnosis not present

## 2022-06-15 ENCOUNTER — Ambulatory Visit
Admission: RE | Admit: 2022-06-15 | Discharge: 2022-06-15 | Disposition: A | Discharge status: Home / Self Care | Payer: Medicare Other | Source: Ambulatory Visit | Attending: Internal Medicine | Admitting: Internal Medicine

## 2022-06-15 DIAGNOSIS — Z1231 Encounter for screening mammogram for malignant neoplasm of breast: Secondary | ICD-10-CM

## 2022-06-25 ENCOUNTER — Encounter: Payer: Self-pay | Admitting: Internal Medicine

## 2022-07-04 DIAGNOSIS — Z8719 Personal history of other diseases of the digestive system: Secondary | ICD-10-CM | POA: Diagnosis not present

## 2022-07-04 DIAGNOSIS — R1310 Dysphagia, unspecified: Secondary | ICD-10-CM | POA: Diagnosis not present

## 2022-07-04 DIAGNOSIS — R1319 Other dysphagia: Secondary | ICD-10-CM | POA: Diagnosis not present

## 2022-07-04 DIAGNOSIS — Z9889 Other specified postprocedural states: Secondary | ICD-10-CM | POA: Diagnosis not present

## 2022-07-18 DIAGNOSIS — R1319 Other dysphagia: Secondary | ICD-10-CM | POA: Diagnosis not present

## 2022-08-07 ENCOUNTER — Ambulatory Visit: Payer: Medicare Other | Admitting: Gastroenterology

## 2022-08-28 DIAGNOSIS — Z9889 Other specified postprocedural states: Secondary | ICD-10-CM | POA: Diagnosis not present

## 2022-08-28 DIAGNOSIS — R1310 Dysphagia, unspecified: Secondary | ICD-10-CM | POA: Diagnosis not present

## 2022-08-28 DIAGNOSIS — R1319 Other dysphagia: Secondary | ICD-10-CM | POA: Diagnosis not present

## 2022-09-07 DIAGNOSIS — M810 Age-related osteoporosis without current pathological fracture: Secondary | ICD-10-CM | POA: Diagnosis not present

## 2022-09-07 DIAGNOSIS — R2689 Other abnormalities of gait and mobility: Secondary | ICD-10-CM | POA: Diagnosis not present

## 2022-09-07 DIAGNOSIS — E639 Nutritional deficiency, unspecified: Secondary | ICD-10-CM | POA: Diagnosis not present

## 2022-09-07 DIAGNOSIS — R413 Other amnesia: Secondary | ICD-10-CM | POA: Diagnosis not present

## 2022-09-07 DIAGNOSIS — R634 Abnormal weight loss: Secondary | ICD-10-CM | POA: Diagnosis not present

## 2022-09-13 DIAGNOSIS — Z6823 Body mass index (BMI) 23.0-23.9, adult: Secondary | ICD-10-CM | POA: Diagnosis not present

## 2022-09-13 DIAGNOSIS — F909 Attention-deficit hyperactivity disorder, unspecified type: Secondary | ICD-10-CM | POA: Diagnosis not present

## 2022-09-13 DIAGNOSIS — T50905A Adverse effect of unspecified drugs, medicaments and biological substances, initial encounter: Secondary | ICD-10-CM | POA: Diagnosis not present

## 2022-09-13 DIAGNOSIS — G47429 Narcolepsy in conditions classified elsewhere without cataplexy: Secondary | ICD-10-CM | POA: Diagnosis not present

## 2022-09-13 DIAGNOSIS — G2571 Drug induced akathisia: Secondary | ICD-10-CM | POA: Diagnosis not present

## 2022-09-14 ENCOUNTER — Encounter (HOSPITAL_COMMUNITY): Payer: Self-pay | Admitting: Emergency Medicine

## 2022-09-14 ENCOUNTER — Emergency Department (HOSPITAL_COMMUNITY): Payer: Medicare Other

## 2022-09-14 ENCOUNTER — Observation Stay (HOSPITAL_COMMUNITY)
Admission: EM | Admit: 2022-09-14 | Discharge: 2022-09-15 | Disposition: A | Discharge status: Home Health | Payer: Medicare Other | Attending: Family Medicine | Admitting: Family Medicine

## 2022-09-14 ENCOUNTER — Other Ambulatory Visit: Payer: Self-pay

## 2022-09-14 DIAGNOSIS — G47419 Narcolepsy without cataplexy: Secondary | ICD-10-CM | POA: Diagnosis present

## 2022-09-14 DIAGNOSIS — I10 Essential (primary) hypertension: Secondary | ICD-10-CM | POA: Diagnosis not present

## 2022-09-14 DIAGNOSIS — Z79899 Other long term (current) drug therapy: Secondary | ICD-10-CM | POA: Insufficient documentation

## 2022-09-14 DIAGNOSIS — F0390 Unspecified dementia without behavioral disturbance: Secondary | ICD-10-CM | POA: Insufficient documentation

## 2022-09-14 DIAGNOSIS — E785 Hyperlipidemia, unspecified: Secondary | ICD-10-CM | POA: Diagnosis present

## 2022-09-14 DIAGNOSIS — T887XXA Unspecified adverse effect of drug or medicament, initial encounter: Secondary | ICD-10-CM | POA: Diagnosis not present

## 2022-09-14 DIAGNOSIS — E039 Hypothyroidism, unspecified: Secondary | ICD-10-CM | POA: Diagnosis not present

## 2022-09-14 DIAGNOSIS — F32A Depression, unspecified: Secondary | ICD-10-CM | POA: Diagnosis not present

## 2022-09-14 DIAGNOSIS — T464X1A Poisoning by angiotensin-converting-enzyme inhibitors, accidental (unintentional), initial encounter: Secondary | ICD-10-CM | POA: Diagnosis not present

## 2022-09-14 DIAGNOSIS — T50901A Poisoning by unspecified drugs, medicaments and biological substances, accidental (unintentional), initial encounter: Secondary | ICD-10-CM

## 2022-09-14 DIAGNOSIS — Z87891 Personal history of nicotine dependence: Secondary | ICD-10-CM | POA: Insufficient documentation

## 2022-09-14 DIAGNOSIS — T43211A Poisoning by selective serotonin and norepinephrine reuptake inhibitors, accidental (unintentional), initial encounter: Secondary | ICD-10-CM | POA: Diagnosis not present

## 2022-09-14 DIAGNOSIS — R0902 Hypoxemia: Secondary | ICD-10-CM | POA: Diagnosis not present

## 2022-09-14 DIAGNOSIS — K219 Gastro-esophageal reflux disease without esophagitis: Secondary | ICD-10-CM

## 2022-09-14 DIAGNOSIS — Z8669 Personal history of other diseases of the nervous system and sense organs: Secondary | ICD-10-CM | POA: Diagnosis not present

## 2022-09-14 DIAGNOSIS — J3489 Other specified disorders of nose and nasal sinuses: Secondary | ICD-10-CM | POA: Diagnosis not present

## 2022-09-14 DIAGNOSIS — J90 Pleural effusion, not elsewhere classified: Secondary | ICD-10-CM | POA: Diagnosis not present

## 2022-09-14 DIAGNOSIS — R4182 Altered mental status, unspecified: Secondary | ICD-10-CM | POA: Diagnosis not present

## 2022-09-14 DIAGNOSIS — K449 Diaphragmatic hernia without obstruction or gangrene: Secondary | ICD-10-CM

## 2022-09-14 DIAGNOSIS — R400 Somnolence: Secondary | ICD-10-CM | POA: Diagnosis not present

## 2022-09-14 DIAGNOSIS — T50904A Poisoning by unspecified drugs, medicaments and biological substances, undetermined, initial encounter: Secondary | ICD-10-CM | POA: Diagnosis not present

## 2022-09-14 LAB — CBC WITH DIFFERENTIAL/PLATELET
Abs Immature Granulocytes: 0.1 10*3/uL — ABNORMAL HIGH (ref 0.00–0.07)
Basophils Absolute: 0.1 10*3/uL (ref 0.0–0.1)
Basophils Relative: 0 %
Eosinophils Absolute: 0 10*3/uL (ref 0.0–0.5)
Eosinophils Relative: 0 %
HCT: 36.9 % (ref 36.0–46.0)
Hemoglobin: 11.4 g/dL — ABNORMAL LOW (ref 12.0–15.0)
Immature Granulocytes: 1 %
Lymphocytes Relative: 4 %
Lymphs Abs: 0.7 10*3/uL (ref 0.7–4.0)
MCH: 27 pg (ref 26.0–34.0)
MCHC: 30.9 g/dL (ref 30.0–36.0)
MCV: 87.2 fL (ref 80.0–100.0)
Monocytes Absolute: 1.1 10*3/uL — ABNORMAL HIGH (ref 0.1–1.0)
Monocytes Relative: 6 %
Neutro Abs: 16 10*3/uL — ABNORMAL HIGH (ref 1.7–7.7)
Neutrophils Relative %: 89 %
Platelets: 416 10*3/uL — ABNORMAL HIGH (ref 150–400)
RBC: 4.23 MIL/uL (ref 3.87–5.11)
RDW: 14 % (ref 11.5–15.5)
WBC: 17.9 10*3/uL — ABNORMAL HIGH (ref 4.0–10.5)
nRBC: 0 % (ref 0.0–0.2)

## 2022-09-14 LAB — COMPREHENSIVE METABOLIC PANEL
ALT: 10 U/L (ref 0–44)
AST: 22 U/L (ref 15–41)
Albumin: 3.9 g/dL (ref 3.5–5.0)
Alkaline Phosphatase: 100 U/L (ref 38–126)
Anion gap: 9 (ref 5–15)
BUN: 17 mg/dL (ref 8–23)
CO2: 29 mmol/L (ref 22–32)
Calcium: 9.1 mg/dL (ref 8.9–10.3)
Chloride: 103 mmol/L (ref 98–111)
Creatinine, Ser: 0.91 mg/dL (ref 0.44–1.00)
GFR, Estimated: >60 mL/min (ref >60)
Glucose, Bld: 129 mg/dL — ABNORMAL HIGH (ref 70–99)
Potassium: 3.9 mmol/L (ref 3.5–5.1)
Sodium: 141 mmol/L (ref 135–145)
Total Bilirubin: 0.8 mg/dL (ref 0.3–1.2)
Total Protein: 7.5 g/dL (ref 6.5–8.1)

## 2022-09-14 LAB — URINALYSIS, ROUTINE W REFLEX MICROSCOPIC
Bilirubin Urine: NEGATIVE
Glucose, UA: NEGATIVE mg/dL
Hgb urine dipstick: NEGATIVE
Ketones, ur: NEGATIVE mg/dL
Nitrite: NEGATIVE
Protein, ur: NEGATIVE mg/dL
Specific Gravity, Urine: 1.014 (ref 1.005–1.030)
pH: 7 (ref 5.0–8.0)

## 2022-09-14 LAB — ACETAMINOPHEN LEVEL: Acetaminophen (Tylenol), Serum: <10 ug/mL — ABNORMAL LOW (ref 10–30)

## 2022-09-14 LAB — RAPID URINE DRUG SCREEN, HOSP PERFORMED
Amphetamines: NOT DETECTED
Barbiturates: NOT DETECTED
Benzodiazepines: NOT DETECTED
Cocaine: NOT DETECTED
Opiates: NOT DETECTED
Tetrahydrocannabinol: NOT DETECTED

## 2022-09-14 LAB — SALICYLATE LEVEL: Salicylate Lvl: <7 mg/dL — ABNORMAL LOW (ref 7.0–30.0)

## 2022-09-14 MED ORDER — DEXTROSE IN LACTATED RINGERS 5 % IV SOLN: INTRAVENOUS | Status: DC

## 2022-09-14 MED ORDER — NALOXONE HCL 2 MG/2ML IJ SOSY
1.0000 mg | PREFILLED_SYRINGE | Freq: Once | INTRAMUSCULAR | Status: AC
  Administered 2022-09-14: 1 mg via INTRAVENOUS
  Filled 2022-09-14: qty 2

## 2022-09-14 MED ORDER — ORAL CARE MOUTH RINSE: 15.0000 mL | OROMUCOSAL | Status: DC | PRN

## 2022-09-14 MED ORDER — ENOXAPARIN SODIUM 40 MG/0.4ML IJ SOSY
40.0000 mg | PREFILLED_SYRINGE | INTRAMUSCULAR | Status: DC
  Administered 2022-09-14: 40 mg via SUBCUTANEOUS
  Filled 2022-09-14: qty 0.4

## 2022-09-14 MED ORDER — ORAL CARE MOUTH RINSE
15.0000 mL | OROMUCOSAL | Status: DC
  Administered 2022-09-14 – 2022-09-15 (×3): 15 mL via OROMUCOSAL

## 2022-09-14 MED ORDER — ONDANSETRON HCL 4 MG/2ML IJ SOLN
4.0000 mg | Freq: Four times a day (QID) | INTRAMUSCULAR | Status: DC | PRN
  Administered 2022-09-14: 4 mg via INTRAVENOUS
  Filled 2022-09-14: qty 2

## 2022-09-14 MED ORDER — CYANOCOBALAMIN 1000 MCG/ML IJ SOLN
1000.0000 ug | Freq: Every day | INTRAMUSCULAR | Status: DC
  Administered 2022-09-14 – 2022-09-15 (×2): 1000 ug via SUBCUTANEOUS
  Filled 2022-09-14 (×2): qty 1

## 2022-09-14 MED ORDER — HYDRALAZINE HCL 20 MG/ML IJ SOLN: 5.0000 mg | INTRAMUSCULAR | Status: DC | PRN

## 2022-09-14 MED ORDER — NALOXONE HCL 0.4 MG/ML IJ SOLN: 0.4000 mg | Freq: Once | INTRAMUSCULAR | Status: DC

## 2022-09-14 MED ORDER — CHLORHEXIDINE GLUCONATE CLOTH 2 % EX PADS
6.0000 | MEDICATED_PAD | Freq: Every day | CUTANEOUS | Status: DC
  Administered 2022-09-15: 6 via TOPICAL

## 2022-09-14 MED ORDER — GADOBUTROL 1 MMOL/ML IV SOLN
5.5000 mL | Freq: Once | INTRAVENOUS | Status: AC | PRN
  Administered 2022-09-14: 5.5 mL via INTRAVENOUS

## 2022-09-14 MED ORDER — SODIUM CHLORIDE 0.9 % IV BOLUS
500.0000 mL | Freq: Once | INTRAVENOUS | Status: AC
  Administered 2022-09-14: 500 mL via INTRAVENOUS

## 2022-09-14 NOTE — H&P (Signed)
TRH H&P   Patient Demographics:    Robyn Ashley, is a 72 y.o. female  MRN: [id number]   DOB - [birth year]  Admit Date - 09/14/2022  Outpatient Primary MD for the patient is Redmond School, MD  Referring MD/NP/PA: Dr Roderic Palau  Patient coming from: home  Chief Complaint  Patient presents with   Drug Overdose      HPI:    Robyn Ashley  is a 72 y.o. female, past medical history of hyperlipidemia, hypertension, hypothyroidism, [place] to ED for increased somnolence from unintentional drug overdose, patient has been actively worked up at [facility] urology for early dementia and cognition impairment, workup was still pending MRI of the brain, she lives with her husband, and she accidentally took her husband's medication yesterday instead of her own, which includes 20 mg of trazodone, 200 mg of BuSpar(not verified) dose is accurate or not), 5 mg ropinirole, 60 mg duloxetine, 150 mg of Lyrica, 500 mg Keppra, 5 mg lisinopril, 100 mg celecoxib , as well there are multiple opioid prescriptions nearby, they are unsure if she took them last night or not, family were unable to wake her up this morning, they had Narcan at home and they administered 8, patient had positive response to Narcan, she arrived quite somnolent to ED and following asleep again, she received another Narcan dose in ED with minimal response -ED labs were significant for leukocytosis of 17.9, UA and urine drug screen is still pending, chest x-ray with no acute findings, MRI of the brain as well with no acute findings, [facility]  hospitalist consulted to admit.  Review of systems:    He is obtunded, unable to provide any review of system   With Past History of the following :    Past Medical History:  Diagnosis Date   Allergy    Anemia    Anginal pain ([place]) 2015   Depression    Dyslipidemia 05/10/2020   Dysrhythmia 2015   SVT    Essential hypertension 05/10/2020   GERD (gastroesophageal reflux disease)    Hiatal hernia    History of degenerative disc disease    History of nuclear stress test 07/27/2013   in care everywhere per cardiology note by dr Donata Clay:  test normal   Hyperlipidemia    Hypertension    Hypothyroidism    Hypothyroidism (acquired) 05/10/2020   Intermittent palpitations    MVP (mitral valve prolapse)    per pt dx 1980s,  last echo done 2016 by dr Donata Clay [facility] cardiology in [place]) and last cardiology visit 11-02-2014 in epic;   01-27-2019 per pt episodic palpitations and takes toprol, currently followed by pcp   Narcolepsy    OSA (obstructive sleep apnea)    Not on CPAP   Osteoporosis    osteopenia   TMJ arthralgia       Past Surgical History:  Procedure Laterality Date   ABDOMINAL HYSTERECTOMY  BIOPSY  06/24/2018   Procedure: BIOPSY;  Surgeon: Daneil Dolin, MD;  [facility];  Service: Endoscopy;;  (gastric) (colon)   BREAST CYST ASPIRATION     COLONOSCOPY N/A 06/24/2018   Procedure: COLONOSCOPY;  Surgeon: Daneil Dolin, MD;  Location: [facility];  Service: Endoscopy;  Laterality: N/A;  1:45pm   ESOPHAGEAL DILATION  09/02/2019   Procedure: ESOPHAGEAL DILATION;  Surgeon: Mauri Pole, MD;  Location: [facility];  Service: Endoscopy;;   ESOPHAGEAL MANOMETRY N/A 09/02/2019   Procedure: ESOPHAGEAL MANOMETRY (EM);  Surgeon: Mauri Pole, MD;  Location: [facility];  Service: Endoscopy;  Laterality: N/A;   ESOPHAGEAL MANOMETRY N/A 02/06/2021   Procedure: ESOPHAGEAL MANOMETRY (EM);  Surgeon: Mauri Pole, MD;  Location: [facility];  Service: Endoscopy;  Laterality: N/A;   ESOPHAGOGASTRODUODENOSCOPY N/A 06/24/2018   Procedure: ESOPHAGOGASTRODUODENOSCOPY (EGD);  Surgeon: Daneil Dolin, MD;  [facility];  Service: Endoscopy;  Laterality: N/A;   ESOPHAGOGASTRODUODENOSCOPY N/A 07/14/2019   Procedure: ESOPHAGOGASTRODUODENOSCOPY (EGD);  Surgeon:  Daneil Dolin, MD;  [facility];  Service: Endoscopy;  Laterality: N/A;  3:00pm   ESOPHAGOGASTRODUODENOSCOPY (EGD) WITH PROPOFOL N/A 09/02/2019   Procedure: ESOPHAGOGASTRODUODENOSCOPY (EGD) WITH PROPOFOL;  Surgeon: Mauri Pole, MD;  Location: [facility];  Service: Endoscopy;  Laterality: N/A;   mano probe placement   EYE SURGERY Bilateral    rk   IR GASTROSTOMY TUBE MOD SED  05/10/2020   IR RADIOLOGIST EVAL & MGMT  05/26/2020   MALONEY DILATION N/A 06/24/2018   Procedure: Venia Minks DILATION;  Surgeon: Daneil Dolin, MD;  [facility];  Service: Endoscopy;  Laterality: N/A;   MALONEY DILATION N/A 07/14/2019   Procedure: Venia Minks DILATION;  Surgeon: Daneil Dolin, MD;  [facility];  Service: Endoscopy;  Laterality: N/A;   NISSEN FUNDOPLICATION  [id number]   takedown   POLYPECTOMY  06/24/2018   Procedure: POLYPECTOMY;  Surgeon: Daneil Dolin, MD;  [facility];  Service: Endoscopy;;  colon    RF ablation of cervical nerves     ROTATOR CUFF REPAIR Right 11-21-2006  dr supple @[facility]$       Social History:     Social History   Tobacco Use   Smoking status: Former    Packs/day: 1.50    Types: Cigarettes    Start date: 1970    Quit date: 07/1997    Years since quitting: 25.1   Smokeless tobacco: Never  Substance Use Topics   Alcohol use: Not Currently    Comment: none in many years       Family History :     Family History  Problem Relation Age of Onset   Colon cancer Maternal Grandfather    Colon cancer Paternal Grandmother    Alzheimer's disease Mother    Heart disease Father    COPD Father    Breast cancer Maternal Grandmother    Liver cancer Brother    Colon polyps Neg Hx    Stomach cancer Neg Hx    Esophageal cancer Neg Hx    Rectal cancer Neg Hx       Home Medications:   Prior to Admission medications   Medication Sig Start Date End Date Taking? Authorizing Provider  acetaminophen (TYLENOL) 500 MG  tablet Take 500 mg by mouth every 8 (eight) hours as needed.   Yes [provider]  atorvastatin (LIPITOR) 20 MG tablet Take 20 mg by mouth at bedtime.  Yes [provider]  benztropine (COGENTIN) 1 MG tablet Take 1 mg by mouth 2 (two) times daily.   Yes [provider]  calcium carbonate (OSCAL) 1500 (600 Ca) MG TABS tablet Take 600 mg by mouth with breakfast.   Yes [provider]  cholecalciferol (VITAMIN D3) 25 MCG (1000 UT) tablet Take 1,000 Units by mouth daily.   Yes [provider]  docusate sodium (COLACE) 100 MG capsule Take 1 capsule (100 mg total) by mouth 2 (two) times daily. 05/11/20  Yes Swayze, Ava, DO  FLUoxetine (PROZAC) 10 MG capsule Take 10 mg by mouth daily.  04/01/18  Yes [provider]  levothyroxine (SYNTHROID, LEVOTHROID) 50 MCG tablet Take 50 mcg by mouth daily before breakfast.  04/26/18  Yes [provider]  magnesium oxide (MAG-OX) 400 MG tablet Take by mouth.   Yes [provider]  methylphenidate (RITALIN) 20 MG tablet Take 20 mg by mouth daily.   Yes [provider]  metoprolol succinate (TOPROL-XL) 50 MG 24 hr tablet Take 50 mg by mouth at bedtime.  04/03/18  Yes [provider]  mirtazapine (REMERON) 30 MG tablet Take 30 mg by mouth daily.   Yes [provider]  NON FORMULARY CPAP at night   Yes [provider]  Nutritional Supplements (FEEDING SUPPLEMENT, OSMOLITE 1.2 CAL,) LIQD Place 237 mLs into feeding tube 5 (five) times daily. 05/11/20  Yes Swayze, Ava, DO  omeprazole (PRILOSEC) 20 MG capsule Take by mouth. 07/18/22  Yes [provider]  senna-docusate (SENOKOT-S) 8.6-50 MG tablet Take 1 tablet by mouth 2 (two) times daily. 09/20/21  Yes [provider]  valACYclovir (VALTREX) 1000 MG tablet Take 1,000 mg by mouth 2 (two) times daily as needed (for outbreak).    Yes [provider]  fluconazole (DIFLUCAN) 10 MG/ML suspension Take  by mouth. Patient not taking: Reported on 09/14/2022 07/06/22   [provider]     Allergies:     Allergies  Allergen Reactions   Tetracyclines & Related Other (See Comments)    Pt developed gastritis while taking tetracycline   Aspirin     bruises   Norvasc [Amlodipine Besylate]     Made BP drop low   Nsaids     bruising   Dexilant [Dexlansoprazole] Diarrhea     Physical Exam:   Vitals  Blood pressure (!) 152/103, pulse 76, temperature 98.3 F (36.8 C), temperature source Oral, resp. rate 19, height 5' 1"$  (1.549 m), weight 54 kg, SpO2 97 %.   1. General frail, thin appearing female, laying in bed, no apparent distress  2.  Patient was obtundent, grimacing to painful stimuli, mumbling some words occasionally and then go back to sleep right away  3. No F.N deficits, ALL C.Nerves Intact, she is an cooperative with physical/neurological exam, but appears to be moving all extremities without significant deficits  4. Ears and Eyes appear Normal, Conjunctivae clear,Moist Oral Mucosa.  5. Supple Neck, No JVD, No cervical lymphadenopathy appriciated, No Carotid Bruits.  6. Symmetrical Chest wall movement, Good air movement bilaterally, CTAB.  7. RRR, No Gallops, Rubs or Murmurs, No Parasternal Heave.  8. Positive Bowel Sounds, Abdomen Soft, No tenderness, No organomegaly appriciated,No rebound -guarding or rigidity.  9.  No Cyanosis, Normal Skin Turgor, No Skin Rash or Bruise.  10. Good muscle tone,  joints appear normal , no effusions, Normal ROM.   Data Review:    CBC Recent Labs  Lab 09/14/22 1352  WBC 17.9*  HGB 11.4*  HCT 36.9  PLT 416*  MCV 87.2  MCH 27.0  MCHC 30.9  RDW 14.0  LYMPHSABS 0.7  MONOABS 1.1*  EOSABS 0.0  BASOSABS 0.1   ------------------------------------------------------------------------------------------------------------------  Chemistries  Recent Labs  Lab 09/14/22 1352  NA 141  K 3.9  CL 103  CO2 29  GLUCOSE 129*   BUN 17  CREATININE 0.91  CALCIUM 9.1  AST 22  ALT 10  ALKPHOS 100  BILITOT 0.8   ------------------------------------------------------------------------------------------------------------------ estimated creatinine clearance is 44 mL/min (by C-G formula based on SCr of 0.91 mg/dL). ------------------------------------------------------------------------------------------------------------------ No results for input(s): "TSH", "T4TOTAL", "T3FREE", "THYROIDAB" in the last 72 hours.  Invalid input(s): "FREET3"  Coagulation profile No results for input(s): "INR", "PROTIME" in the last 168 hours. ------------------------------------------------------------------------------------------------------------------- No results for input(s): "DDIMER" in the last 72 hours. -------------------------------------------------------------------------------------------------------------------  Cardiac Enzymes No results for input(s): "CKMB", "TROPONINI", "MYOGLOBIN" in the last 168 hours.  Invalid input(s): "CK" ------------------------------------------------------------------------------------------------------------------ No results found for: "BNP"   ---------------------------------------------------------------------------------------------------------------  Urinalysis No results found for: "COLORURINE", "APPEARANCEUR", "LABSPEC", "PHURINE", "GLUCOSEU", "HGBUR", "BILIRUBINUR", "KETONESUR", "PROTEINUR", "UROBILINOGEN", "NITRITE", "LEUKOCYTESUR"  ----------------------------------------------------------------------------------------------------------------   Imaging Results:    DG Chest Portable 1 View  Result Date: 09/14/2022 CLINICAL DATA:  Altered mental status.  Possible aspiration. EXAM: PORTABLE CHEST 1 VIEW COMPARISON:  CT 05/05/2020.  Radiographs 01/31/2019 and 01/29/2019. FINDINGS: 1559 hours. The heart size and mediastinal contours are stable with aortic atherosclerosis. The  pleural effusions noted on prior radiographs have resolved. The lungs appear clear. There is no pneumothorax. No acute osseous findings are identified. Mild degenerative changes are present in the spine. IMPRESSION: No evidence of acute cardiopulmonary process. Interval resolution of bilateral pleural effusions. Electronically Signed   By: Richardean Sale M.D.   On: 09/14/2022 16:17   MR Brain W and Wo Contrast  Result Date: 09/14/2022 CLINICAL DATA:  Delirium dementia work up EXAM: MRI HEAD WITHOUT AND WITH CONTRAST TECHNIQUE: Multiplanar, multiecho pulse sequences of the brain and surrounding structures were obtained without and with intravenous contrast. CONTRAST:  5.81m GADAVIST GADOBUTROL 1 MMOL/ML IV SOLN COMPARISON:  None Available. FINDINGS: Brain: No acute infarction, hemorrhage, hydrocephalus, extra-axial collection or mass lesion. Mild for age T2/FLAIR hyperintensity in the white matter, nonspecific but compatible with chronic microvascular ischemic disease. No pathologic enhancement. Partially empty sella. Vascular: Normal flow voids. Skull and upper cervical spine: Major arterial flow voids are maintained skull base. Sinuses/Orbits: Moderate left paranasal sinus mucosal thickening. No acute oral findings. Other: No mastoid effusions. IMPRESSION: No evidence of acute intracranial abnormality. Electronically Signed   By: FMargaretha SheffieldM.D.   On: 09/14/2022 15:57    EKG:  Vent. rate 72 BPM PR interval 177 ms QRS duration 103 ms QT/QTcB 399/437 ms P-R-T axes 65 17 38 Sinus rhythm Low voltage, precordial leads  Assessment & Plan:    Principal Problem:   Overdose Active Problems:   GERD (gastroesophageal reflux disease)   Hiatal hernia with GERD without esophagitis   Hx of sleep apnea   Essential hypertension   Dyslipidemia   Narcolepsy   Depression   Hypothyroidism (acquired)   Acute toxic encephalopathy Intentional overdose -[facility] she took her husband medications by mistake inccluding20 mg of trazodone, 200 mg of BuSpar !! , 5 mg ropinirole, 60 mg duloxetine, 150 mg of Lyrica, 500 mg Keppra, 5 mg lisinopril, 100 mg celecoxib. -MRI brain with no acute findings. -No significant labs abnormalities. -No acute EKG changes. -Salicylate  level, acetaminophen level, reassuring. -She will be admitted for monitoring to stepdown unit, will keep on neurocheck every 4 hours, and will repeat EKG in a.m., continue with IV fluids, and will keep n.p.o. until she is more awake.  Cognition/memory impairment -Workup as an outpatient for early dementia, she is following at [facility] neurology, workup significant for borderline B12 level and low vitamin D level, will start on IM B12 supplement, and Vitamin E 1 she is awake and safe to swallow.  Hypothyroidism -Resume home Synthroid when more awake and safe to take oral  OSA -Resume back on CPAP when she is more awake  Hyperlipidemia -Resume statin when more awake and safe to take oral  Hypertension -Blood pressure is elevated, unable to take oral currently, will keep on as needed hydralazine  Depression/narcolepsy -Resume her home meds when more awake and able to take oral   DVT Prophylaxis  Lovenox   AM Labs Ordered, also please review Full Orders  Family Communication: Admission, patients condition and plan of care including tests being ordered have been discussed with the daughters at bedside  who indicate understanding and agree with the plan and Code Status.  Code Status Full code as D/w daughter at bedside  Likely DC to  home  Condition GUARDED    Consults called: none    Admission status: observation    Time spent in minutes : 70 minutes   Phillips Climes M.D on 09/14/2022 at 5:28 PM   [facility] - Office  [phone]

## 2022-09-14 NOTE — ED Notes (Signed)
Called to give report. Was left on hold. Called back again about 15 minutes later and no one answered.

## 2022-09-14 NOTE — ED Triage Notes (Signed)
Pt reports she accidentally took her husbands medication last night and then took all of her own medications this morning. There is a list of medications but not a certain known dosage or medications taken.

## 2022-09-14 NOTE — ED Provider Notes (Signed)
[facility] Provider Note  CSN: [id number] Arrival date & time: 09/14/22 1313  Chief Complaint(s) Drug Overdose  HPI Robyn Ashley is a 72 y.o. female with PMH HLD, HTN, hypothyroidism who presents emergency department for evaluation of somnolence and unintentional drug overdose.  Patient is currently being worked up for dementia and is pending a brain MRI.  She reportedly accidentally took her husband's medications yesterday instead of her own which include 20 mg of trazodone, 200 mg of BuSpar, 5 mg ropinirole, 60 mg duloxetine, 150 mg of Lyrica, 500 mg Keppra, 5 mg lisinopril, 100 mg celecoxib.  There are also multiple opioid prescriptions nearby and family is unsure if she took these last night.  Family unable to wake the patient this morning and one of the family members had Narcan at home and administered it.  Patient had positive response to Narcan but arrives quite somnolent and is falling asleep on my exam.  He arrives hemodynamically stable but is unable to provide additional history.  Of note, patient arrives with what appears to be dried vomit around the mouth.  Family unsure if the patient had an episode of emesis.   Past Medical History Past Medical History:  Diagnosis Date   Allergy    Anemia    Anginal pain ([place]) 2015   Depression    Dyslipidemia 05/10/2020   Dysrhythmia 2015   SVT   Essential hypertension 05/10/2020   GERD (gastroesophageal reflux disease)    Hiatal hernia    History of degenerative disc disease    History of nuclear stress test 07/27/2013   in care everywhere per cardiology note by dr Donata Clay:  test normal   Hyperlipidemia    Hypertension    Hypothyroidism    Hypothyroidism (acquired) 05/10/2020   Intermittent palpitations    MVP (mitral valve prolapse)    per pt dx 1980s,  last echo done 2016 by dr Donata Clay [facility] cardiology in [place]) and last cardiology visit 11-02-2014 in epic;   01-27-2019 per pt  episodic palpitations and takes toprol, currently followed by pcp   Narcolepsy    OSA (obstructive sleep apnea)    Not on CPAP   Osteoporosis    osteopenia   TMJ arthralgia    Patient Active Problem List   Diagnosis Date Noted   Vasovagal response 05/10/2020   Essential hypertension 05/10/2020   Dyslipidemia 05/10/2020   Narcolepsy 05/10/2020   Depression 05/10/2020   Hypothyroidism (acquired) 05/10/2020   Protein-calorie malnutrition, severe 09/30/2019   Hx of sleep apnea 02/06/2019   Other emphysema ([place]) 02/06/2019   Acute respiratory failure with hypoxia ([place]) 02/06/2019   Hiatal hernia with GERD without esophagitis 01/28/2019   GERD (gastroesophageal reflux disease) 05/07/2018   Dysphagia 05/07/2018   History of IBS 05/07/2018   Home Medication(s) Prior to Admission medications   Medication Sig Start Date End Date Taking? Authorizing Provider  acetaminophen (TYLENOL) 500 MG tablet Take 500 mg by mouth every 8 (eight) hours as needed.   Yes [provider]  atorvastatin (LIPITOR) 20 MG tablet Take 20 mg by mouth at bedtime.    Yes [provider]  benztropine (COGENTIN) 1 MG tablet Take 1 mg by mouth 2 (two) times daily.   Yes [provider]  calcium carbonate (OSCAL) 1500 (600 Ca) MG TABS tablet Take 600 mg by mouth with breakfast.   Yes [provider]  cholecalciferol (VITAMIN D3) 25 MCG (1000 UT) tablet Take 1,000 Units by mouth  daily.   Yes [provider]  docusate sodium (COLACE) 100 MG capsule Take 1 capsule (100 mg total) by mouth 2 (two) times daily. 05/11/20  Yes Swayze, Ava, DO  FLUoxetine (PROZAC) 10 MG capsule Take 10 mg by mouth daily.  04/01/18  Yes [provider]  levothyroxine (SYNTHROID, LEVOTHROID) 50 MCG tablet Take 50 mcg by mouth daily before breakfast.  04/26/18  Yes [provider]  magnesium oxide (MAG-OX) 400 MG tablet Take by mouth.   Yes [provider]  methylphenidate  (RITALIN) 20 MG tablet Take 20 mg by mouth daily.   Yes [provider]  metoprolol succinate (TOPROL-XL) 50 MG 24 hr tablet Take 50 mg by mouth at bedtime.  04/03/18  Yes [provider]  mirtazapine (REMERON) 30 MG tablet Take 30 mg by mouth daily.   Yes [provider]  NON FORMULARY CPAP at night   Yes [provider]  Nutritional Supplements (FEEDING SUPPLEMENT, OSMOLITE 1.2 CAL,) LIQD Place 237 mLs into feeding tube 5 (five) times daily. 05/11/20  Yes Swayze, Ava, DO  omeprazole (PRILOSEC) 20 MG capsule Take by mouth. 07/18/22  Yes [provider]  senna-docusate (SENOKOT-S) 8.6-50 MG tablet Take 1 tablet by mouth 2 (two) times daily. 09/20/21  Yes [provider]  valACYclovir (VALTREX) 1000 MG tablet Take 1,000 mg by mouth 2 (two) times daily as needed (for outbreak).    Yes [provider]  fluconazole (DIFLUCAN) 10 MG/ML suspension Take by mouth. Patient not taking: Reported on 09/14/2022 07/06/22   [provider]                                                                                                                                    Past Surgical History Past Surgical History:  Procedure Laterality Date   ABDOMINAL HYSTERECTOMY     BIOPSY  06/24/2018   Procedure: BIOPSY;  Surgeon: Daneil Dolin, MD;  [facility];  Service: Endoscopy;;  (gastric) (colon)   BREAST CYST ASPIRATION     COLONOSCOPY N/A 06/24/2018   Procedure: COLONOSCOPY;  Surgeon: Daneil Dolin, MD;  [facility];  Service: Endoscopy;  Laterality: N/A;  1:45pm   ESOPHAGEAL DILATION  09/02/2019   Procedure: ESOPHAGEAL DILATION;  Surgeon: Mauri Pole, MD;  [facility];  Service: Endoscopy;;   ESOPHAGEAL MANOMETRY N/A 09/02/2019   Procedure: ESOPHAGEAL MANOMETRY (EM);  Surgeon: Mauri Pole, MD;  [facility];  Service: Endoscopy;  Laterality: N/A;   ESOPHAGEAL MANOMETRY N/A 02/06/2021    Procedure: ESOPHAGEAL MANOMETRY (EM);  Surgeon: Mauri Pole, MD;  [facility];  Service: Endoscopy;  Laterality: N/A;   ESOPHAGOGASTRODUODENOSCOPY N/A 06/24/2018   Procedure: ESOPHAGOGASTRODUODENOSCOPY (EGD);  Surgeon: Daneil Dolin, MD;  [facility];  Service: Endoscopy;  Laterality: N/A;   ESOPHAGOGASTRODUODENOSCOPY N/A 07/14/2019   Procedure: ESOPHAGOGASTRODUODENOSCOPY (EGD);  Surgeon: Daneil Dolin,  MD;  [facility];  Service: Endoscopy;  Laterality: N/A;  3:00pm   ESOPHAGOGASTRODUODENOSCOPY (EGD) WITH PROPOFOL N/A 09/02/2019   Procedure: ESOPHAGOGASTRODUODENOSCOPY (EGD) WITH PROPOFOL;  Surgeon: Mauri Pole, MD;  [facility];  Service: Endoscopy;  Laterality: N/A;   mano probe placement   EYE SURGERY Bilateral    rk   IR GASTROSTOMY TUBE MOD SED  05/10/2020   IR RADIOLOGIST EVAL & MGMT  05/26/2020   MALONEY DILATION N/A 06/24/2018   Procedure: Venia Minks DILATION;  Surgeon: Daneil Dolin, MD;  [facility];  Service: Endoscopy;  Laterality: N/A;   MALONEY DILATION N/A 07/14/2019   Procedure: Venia Minks DILATION;  Surgeon: Daneil Dolin, MD;  [facility];  Service: Endoscopy;  Laterality: N/A;   NISSEN FUNDOPLICATION  [id number]   takedown   POLYPECTOMY  06/24/2018   Procedure: POLYPECTOMY;  Surgeon: Daneil Dolin, MD;  [facility];  Service: Endoscopy;;  colon    RF ablation of cervical nerves     ROTATOR CUFF REPAIR Right 11-21-2006  dr supple @[facility]$    Family History Family History  Problem Relation Age of Onset   Colon cancer Maternal Grandfather    Colon cancer Paternal Grandmother    Alzheimer's disease Mother    Heart disease Father    COPD Father    Breast cancer Maternal Grandmother    Liver cancer Brother    Colon polyps Neg Hx    Stomach cancer Neg Hx    Esophageal cancer Neg Hx    Rectal cancer Neg Hx     Social History Social History   Tobacco Use   Smoking status:  Former    Packs/day: 1.50    Types: Cigarettes    Start date: 1970    Quit date: 07/1997    Years since quitting: 25.1   Smokeless tobacco: Never  Vaping Use   Vaping Use: Never used  Substance Use Topics   Alcohol use: Not Currently    Comment: none in many years   Drug use: Never   Allergies Tetracyclines & related, Aspirin, Norvasc [amlodipine besylate], Nsaids, and Dexilant [dexlansoprazole]  Review of Systems Review of Systems  Unable to perform ROS: Mental status change    Physical Exam Vital Signs  I have reviewed the triage vital signs BP 132/65 (BP Location: Left Arm)   Pulse 73   Temp 98.3 F (36.8 C) (Oral)   Resp 20   Ht 5' 1"$  (1.549 m)   Wt 54 kg   SpO2 95%   BMI 22.49 kg/m   Physical Exam Vitals and nursing note reviewed.  Constitutional:      General: She is not in acute distress.    Appearance: She is well-developed.  HENT:     Head: Normocephalic and atraumatic.  Eyes:     Conjunctiva/sclera: Conjunctivae normal.  Cardiovascular:     Rate and Rhythm: Normal rate and regular rhythm.     Heart sounds: No murmur heard. Pulmonary:     Effort: Pulmonary effort is normal. No respiratory distress.     Breath sounds: Normal breath sounds.  Abdominal:     Palpations: Abdomen is soft.     Tenderness: There is no abdominal tenderness.  Musculoskeletal:        General: No swelling.     Cervical back: Neck supple.  Skin:    General: Skin is warm and dry.     Capillary Refill: Capillary refill takes less than 2 seconds.  Neurological:     Mental Status: She is alert. She is disoriented.  Psychiatric:        Mood and Affect: Mood normal.     ED Results and Treatments Labs (all labs ordered are listed, but only abnormal results are displayed) Labs Reviewed  COMPREHENSIVE METABOLIC PANEL - Abnormal; Notable for the following components:      Result Value   Glucose, Bld 129 (*)    All other components within normal limits  CBC WITH  DIFFERENTIAL/PLATELET - Abnormal; Notable for the following components:   WBC 17.9 (*)    Hemoglobin 11.4 (*)    Platelets 416 (*)    Neutro Abs 16.0 (*)    Monocytes Absolute 1.1 (*)    Abs Immature Granulocytes 0.10 (*)    All other components within normal limits  ACETAMINOPHEN LEVEL - Abnormal; Notable for the following components:   Acetaminophen (Tylenol), Serum <10 (*)    All other components within normal limits  SALICYLATE LEVEL - Abnormal; Notable for the following components:   Salicylate Lvl Q000111Q (*)    All other components within normal limits                                                                                                                          Radiology No results found.  Pertinent labs & imaging results that were available during my care of the patient were reviewed by me and considered in my medical decision making (see MDM for details).  Medications Ordered in ED Medications  naloxone (NARCAN) injection 1 mg (has no administration in time range)                                                                                                                                     Procedures .Critical Care  Performed by: Teressa Lower, MD Authorized by: Teressa Lower, MD   Critical care provider statement:    Critical care time (minutes):  30   Critical care was necessary to treat or prevent imminent or life-threatening deterioration of the following conditions:  Respiratory failure   Critical care was time spent personally by me on the following activities:  Development of treatment plan with patient or surrogate, discussions with consultants, evaluation of patient's response to treatment, examination of patient, ordering and review of laboratory studies, ordering and review of radiographic studies, ordering and performing treatments and  interventions, pulse oximetry, re-evaluation of patient's condition and review of old charts   (including  critical care time)  Medical Decision Making / ED Course   This patient presents to the ED for concern of somnolence, unintentional overdose, this involves an extensive number of treatment options, and is a complaint that carries with it a high risk of complications and morbidity.  The differential diagnosis includes polypharmacy, toxic encephalopathy, metabolic encephalopathy, head injury, aspiration  MDM: Patient seen emergency room for evaluation of somnolence and unintentional overdose.  Physical exam reveals a somnolent appearing patient that is hypoxic on room air but saturations improved with 2 L nasal cannula.  Laboratory evaluation with leukocytosis to 17.9 but is otherwise unremarkable.  Aspirin Tylenol negative.  Additional dose of Narcan is ordered and at time of signout, patient pending chest x-ray to rule out aspiration and MRI brain to assist outpatient providers with dementia workup.  Please see provider signout continuation of workup.   Additional history obtained: -Additional history obtained from family members -External records from outside source obtained and reviewed including: Chart review including previous notes, labs, imaging, consultation notes   Lab Tests: -I ordered, reviewed, and interpreted labs.   The pertinent results include:   Labs Reviewed  COMPREHENSIVE METABOLIC PANEL - Abnormal; Notable for the following components:      Result Value   Glucose, Bld 129 (*)    All other components within normal limits  CBC WITH DIFFERENTIAL/PLATELET - Abnormal; Notable for the following components:   WBC 17.9 (*)    Hemoglobin 11.4 (*)    Platelets 416 (*)    Neutro Abs 16.0 (*)    Monocytes Absolute 1.1 (*)    Abs Immature Granulocytes 0.10 (*)    All other components within normal limits  ACETAMINOPHEN LEVEL - Abnormal; Notable for the following components:   Acetaminophen (Tylenol), Serum <10 (*)    All other components within normal limits  SALICYLATE LEVEL  - Abnormal; Notable for the following components:   Salicylate Lvl Q000111Q (*)    All other components within normal limits      EKG   EKG Interpretation  Date/Time:  Friday September 14 2022 13:29:23 EST Ventricular Rate:  72 PR Interval:  177 QRS Duration: 103 QT Interval:  399 QTC Calculation: 437 R Axis:   17 Text Interpretation: Sinus rhythm Low voltage, precordial leads Confirmed by [place] ([id number]) on 09/14/2022 2:30:37 PM         Imaging Studies ordered: I ordered imaging studies including chest x-ray, MRI brain and these are pending   Medicines ordered and prescription drug management: Meds ordered this encounter  Medications   DISCONTD: naloxone (NARCAN) injection 0.4 mg   naloxone (NARCAN) injection 1 mg    -I have reviewed the patients home medicines and have made adjustments as needed  Critical interventions Supplemental oxygen    Cardiac Monitoring: The patient was maintained on a cardiac monitor.  I personally viewed and interpreted the cardiac monitored which showed an underlying rhythm of: NSR  Social Determinants of Health:  Factors impacting patients care include: Currently undergoing dementia workup by outpatient PCP   Reevaluation: After the interventions noted above, I reevaluated the patient and found that they have :stayed the same  Co morbidities that complicate the patient evaluation  Past Medical History:  Diagnosis Date   Allergy    Anemia    Anginal pain ([place]) 2015   Depression    Dyslipidemia 05/10/2020   Dysrhythmia 2015  SVT   Essential hypertension 05/10/2020   GERD (gastroesophageal reflux disease)    Hiatal hernia    History of degenerative disc disease    History of nuclear stress test 07/27/2013   in care everywhere per cardiology note by dr Donata Clay:  test normal   Hyperlipidemia    Hypertension    Hypothyroidism    Hypothyroidism (acquired) 05/10/2020   Intermittent palpitations    MVP (mitral valve prolapse)     per pt dx 1980s,  last echo done 2016 by dr Donata Clay [facility] cardiology in [place]) and last cardiology visit 11-02-2014 in epic;   01-27-2019 per pt episodic palpitations and takes toprol, currently followed by pcp   Narcolepsy    OSA (obstructive sleep apnea)    Not on CPAP   Osteoporosis    osteopenia   TMJ arthralgia       Dispostion: I considered admission for this patient, and disposition pending metabolization of underlying medications.  Please see provider signout for continuation of workup     Final Clinical Impression(s) / ED Diagnoses Final diagnoses:  None     @PCDICTATION$ @    Teressa Lower, MD 09/14/22 1458

## 2022-09-15 DIAGNOSIS — J690 Pneumonitis due to inhalation of food and vomit: Secondary | ICD-10-CM

## 2022-09-15 DIAGNOSIS — I10 Essential (primary) hypertension: Secondary | ICD-10-CM | POA: Diagnosis not present

## 2022-09-15 DIAGNOSIS — K219 Gastro-esophageal reflux disease without esophagitis: Secondary | ICD-10-CM | POA: Diagnosis not present

## 2022-09-15 DIAGNOSIS — T50901D Poisoning by unspecified drugs, medicaments and biological substances, accidental (unintentional), subsequent encounter: Secondary | ICD-10-CM | POA: Diagnosis not present

## 2022-09-15 DIAGNOSIS — D72829 Elevated white blood cell count, unspecified: Secondary | ICD-10-CM

## 2022-09-15 DIAGNOSIS — T43211A Poisoning by selective serotonin and norepinephrine reuptake inhibitors, accidental (unintentional), initial encounter: Secondary | ICD-10-CM | POA: Diagnosis not present

## 2022-09-15 LAB — COMPREHENSIVE METABOLIC PANEL
ALT: 9 U/L (ref 0–44)
AST: 16 U/L (ref 15–41)
Albumin: 3 g/dL — ABNORMAL LOW (ref 3.5–5.0)
Alkaline Phosphatase: 77 U/L (ref 38–126)
Anion gap: 5 (ref 5–15)
BUN: 20 mg/dL (ref 8–23)
CO2: 29 mmol/L (ref 22–32)
Calcium: 8.4 mg/dL — ABNORMAL LOW (ref 8.9–10.3)
Chloride: 106 mmol/L (ref 98–111)
Creatinine, Ser: 0.73 mg/dL (ref 0.44–1.00)
GFR, Estimated: >60 mL/min (ref >60)
Glucose, Bld: 122 mg/dL — ABNORMAL HIGH (ref 70–99)
Potassium: 3.5 mmol/L (ref 3.5–5.1)
Sodium: 140 mmol/L (ref 135–145)
Total Bilirubin: 0.6 mg/dL (ref 0.3–1.2)
Total Protein: 5.8 g/dL — ABNORMAL LOW (ref 6.5–8.1)

## 2022-09-15 LAB — CBC
HCT: 29.9 % — ABNORMAL LOW (ref 36.0–46.0)
Hemoglobin: 9.2 g/dL — ABNORMAL LOW (ref 12.0–15.0)
MCH: 27.3 pg (ref 26.0–34.0)
MCHC: 30.8 g/dL (ref 30.0–36.0)
MCV: 88.7 fL (ref 80.0–100.0)
Platelets: 329 10*3/uL (ref 150–400)
RBC: 3.37 MIL/uL — ABNORMAL LOW (ref 3.87–5.11)
RDW: 14.1 % (ref 11.5–15.5)
WBC: 14.8 10*3/uL — ABNORMAL HIGH (ref 4.0–10.5)
nRBC: 0 % (ref 0.0–0.2)

## 2022-09-15 LAB — HIV ANTIBODY (ROUTINE TESTING W REFLEX): HIV Screen 4th Generation wRfx: NONREACTIVE

## 2022-09-15 LAB — MRSA NEXT GEN BY PCR, NASAL: MRSA by PCR Next Gen: NOT DETECTED

## 2022-09-15 MED ORDER — SODIUM CHLORIDE 0.9 % IV BOLUS
500.0000 mL | Freq: Once | INTRAVENOUS | Status: AC
  Administered 2022-09-15: 500 mL via INTRAVENOUS

## 2022-09-15 MED ORDER — AMOXICILLIN-POT CLAVULANATE 875-125 MG PO TABS: 1.0000 | ORAL_TABLET | Freq: Two times a day (BID) | ORAL | 0 refills | Status: AC

## 2022-09-15 NOTE — Progress Notes (Signed)
Patient discharged to home. IV access removed and external urinary capture device removed. Information given to patient and daughters and where the one medication change could be obtained. Patient dressed and was able to get on wheel chair and taken to car for transport home.

## 2022-09-15 NOTE — Discharge Instructions (Addendum)
Please take oral augmentin for 3 days to cover for possible aspiration pneumonia    IMPORTANT INFORMATION: PAY CLOSE ATTENTION   PHYSICIAN DISCHARGE INSTRUCTIONS  Follow with Primary care provider  Redmond School, MD  and other consultants as instructed by your Hospitalist Physician  [place], WORSEN OR NEW PROBLEM DEVELOPS   Please note: You were cared for by a hospitalist during your hospital stay. Every effort will be made to forward records to your primary care provider.  You can request that your primary care provider send for your hospital records if they have not received them.  Once you are discharged, your primary care physician will handle any further medical issues. Please note that NO REFILLS for any discharge medications will be authorized once you are discharged, as it is imperative that you return to your primary care physician (or establish a relationship with a primary care physician if you do not have one) for your post hospital discharge needs so that they can reassess your need for medications and monitor your lab values.  Please get a complete blood count and chemistry panel checked by your Primary MD at your next visit, and again as instructed by your Primary MD.  Get Medicines reviewed and adjusted: Please take all your medications with you for your next visit with your Primary MD  Laboratory/radiological data: Please request your Primary MD to go over all hospital tests and procedure/radiological results at the follow up, please ask your primary care provider to get all Hospital records sent to his/her office.  In some cases, they will be blood work, cultures and biopsy results pending at the time of your discharge. Please request that your primary care provider follow up on these results.  If you are diabetic, please bring your blood sugar readings with you to your follow up appointment with primary care.     Please call and make your follow up appointments as soon as possible.    Also Note the following: If you experience worsening of your admission symptoms, develop shortness of breath, life threatening emergency, suicidal or homicidal thoughts you must seek medical attention immediately by calling 911 or calling your MD immediately  if symptoms less severe.  You must read complete instructions/literature along with all the possible adverse reactions/side effects for all the Medicines you take and that have been prescribed to you. Take any new Medicines after you have completely understood and accpet all the possible adverse reactions/side effects.   Do not drive when taking Pain medications or sleeping medications (Benzodiazepines)  Do not take more than prescribed Pain, Sleep and Anxiety Medications. It is not advisable to combine anxiety,sleep and pain medications without talking with your primary care practitioner  Special Instructions: If you have smoked or chewed Tobacco  in the last 2 yrs please stop smoking, stop any regular Alcohol  and or any Recreational drug use.  Wear Seat belts while driving.  Do not drive if taking any narcotic, mind altering or controlled substances or recreational drugs or alcohol.

## 2022-09-15 NOTE — Hospital Course (Signed)
72 y.o. female, past medical history of hyperlipidemia, hypertension, hypothyroidism, presented to ED for increased somnolence from unintentional drug overdose, patient has been actively worked up at [facility] urology for early dementia and cognition impairment, workup was still pending MRI of the brain, she lives with her husband, and she accidentally took her husband's medication yesterday instead of her own, which includes trazodone, 200 mg of BuSpar(not verified) dose is accurate or not), 5 mg ropinirole, 60 mg duloxetine, 150 mg of Lyrica, 500 mg Keppra, 5 mg lisinopril, 100 mg celecoxib , as well there are multiple opioid prescriptions nearby, they are unsure if she took them last night or not, family were unable to wake her up this morning, they had Narcan at home and they administered, patient had positive response to Narcan, she arrived quite somnolent to ED and following asleep again, she received another Narcan dose in ED with minimal response -ED labs were significant for leukocytosis of 17.9, UA and urine drug screen is still pending, chest x-ray with no acute findings, MRI of the brain as well with no acute findings, [facility]  hospitalist consulted to admit.

## 2022-09-15 NOTE — TOC Transition Note (Signed)
This RNCM called patient, husband and unable to reach. This RNCM spoke with patient's daughter Anderson Malta to offer choice for [facility] services. 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 This RNCM explained a representative with [facility] will call within 24-48 hours to set up [facility] services.   This RNCM notified Tommi Rumps with [facility] who will follow patient for HHPT, RN services post discharge. [place] agency information on AVS.    No additional TOC needs at this time.

## 2022-09-15 NOTE — Discharge Summary (Signed)
Physician Discharge Summary  Robyn Ashley [id number] DOB: 12-13-1950 DOA: 09/14/2022  PCP: Redmond School, MD Neurologist: Dr. Erik Obey   Admit date: 09/14/2022 Discharge date: 09/15/2022  Admitted From:  HOME  Disposition: [place]   Recommendations for Outpatient Follow-up:  Follow up with PCP in 1 weeks Follow up with neurologist as scheduled in 2 weeks  Home Health: RN, PT  Discharge Condition: STABLE   CODE STATUS: FULL DIET: Resume prior home diet    Brief Hospitalization Summary: Please see all hospital notes, images, labs for full details of the hospitalization. ADMISSION HPI:  72 y.o. female, past medical history of hyperlipidemia, hypertension, hypothyroidism, presented to ED for increased somnolence from unintentional drug overdose, patient has been actively worked up at [facility] urology for early dementia and cognition impairment, workup was still pending MRI of the brain, she lives with her husband, and she accidentally took her husband's medication yesterday instead of her own, which includes trazodone, 200 mg of BuSpar(not verified) dose is accurate or not), 5 mg ropinirole, 60 mg duloxetine, 150 mg of Lyrica, 500 mg Keppra, 5 mg lisinopril, 100 mg celecoxib , as well there are multiple opioid prescriptions nearby, they are unsure if she took them last night or not, family were unable to wake her up this morning, they had Narcan at home and they administered, patient had positive response to Narcan, she arrived quite somnolent to ED and following asleep again, she received another Narcan dose in ED with minimal response -ED labs were significant for leukocytosis of 17.9, UA and urine drug screen is still pending, chest x-ray with no acute findings, MRI of the brain as well with no acute findings, [facility]ist consulted to admit.  HOSPITAL COURSE  Patient was admitted into the hospital for observation after an unintentional drug overdose where she had  taken her husband's medications by mistake.  Her urine drug screen toxicology screen tested negative.  She was given supportive measures with IV fluids and monitored in the stepdown ICU.  She presented very somnolent but was able to protect her airway.  She was initially made n.p.o. until her mentation improved to normal.  She has woken up this morning and she is requesting to eat and drink.  We have placed her on a diet and she seems to be tolerating that very well.  Her daughters are at the bedside and they report that she seems to be back to her baseline mentation.  Labs are stable, WBC is trending down now.  We will have her take augmentin orally x 3 days in case she has some underlying aspiration pneumonia.  She has a deep congested cough.  She is on room air with no respiratory distress.  We have made arrangements for her to have some home health PT and RN and daughters are going to assist her now with medication management at home.  She is following with a neurologist now to be thoroughly evaluated for early signs of dementia.  She seems stable now to discharge home with close outpatient follow-up.  Discharge Diagnoses:  Principal Problem:   Overdose Active Problems:   GERD (gastroesophageal reflux disease)   Hiatal hernia with GERD without esophagitis   Hx of sleep apnea   Essential hypertension   Dyslipidemia   Narcolepsy   Depression   Hypothyroidism (acquired)   Discharge Instructions:  Allergies as of 09/15/2022       Reactions   Tetracyclines & Related Other (See Comments)   Pt  developed gastritis while taking tetracycline   Aspirin    bruises   Norvasc [amlodipine Besylate]    Made BP drop low   Nsaids    bruising   Dexilant [dexlansoprazole] Diarrhea        Medication List     STOP taking these medications    fluconazole 10 MG/ML suspension Commonly known as: DIFLUCAN   methylphenidate 20 MG tablet Commonly known as: RITALIN       TAKE these medications     acetaminophen 500 MG tablet Commonly known as: TYLENOL Take 500 mg by mouth every 8 (eight) hours as needed.   amoxicillin-clavulanate 875-125 MG tablet Commonly known as: AUGMENTIN Take 1 tablet by mouth 2 (two) times daily for 3 days.   atorvastatin 20 MG tablet Commonly known as: LIPITOR Take 20 mg by mouth at bedtime.   benztropine 1 MG tablet Commonly known as: COGENTIN Take 1 mg by mouth 2 (two) times daily.   calcium carbonate 1500 (600 Ca) MG Tabs tablet Commonly known as: OSCAL Take 600 mg by mouth with breakfast.   cholecalciferol 25 MCG (1000 UNIT) tablet Commonly known as: VITAMIN D3 Take 1,000 Units by mouth daily.   docusate sodium 100 MG capsule Commonly known as: COLACE Take 1 capsule (100 mg total) by mouth 2 (two) times daily.   feeding supplement (OSMOLITE 1.2 CAL) Liqd Place 237 mLs into feeding tube 5 (five) times daily.   FLUoxetine 10 MG capsule Commonly known as: PROZAC Take 10 mg by mouth daily.   levothyroxine 50 MCG tablet Commonly known as: SYNTHROID Take 50 mcg by mouth daily before breakfast.   magnesium oxide 400 MG tablet Commonly known as: MAG-OX Take by mouth.   metoprolol succinate 50 MG 24 hr tablet Commonly known as: TOPROL-XL Take 50 mg by mouth at bedtime.   mirtazapine 30 MG tablet Commonly known as: REMERON Take 30 mg by mouth daily.   NON FORMULARY CPAP at night   omeprazole 20 MG capsule Commonly known as: PRILOSEC Take by mouth.   senna-docusate 8.6-50 MG tablet Commonly known as: Senokot-S Take 1 tablet by mouth 2 (two) times daily.   valACYclovir 1000 MG tablet Commonly known as: VALTREX Take 1,000 mg by mouth 2 (two) times daily as needed (for outbreak).        Follow-up Information     Redmond School, MD. Schedule an appointment as soon as possible for a visit in 1 week(s).   Specialty: Internal Medicine Why: Hospital Follow Up Contact information: [address] [place]  [address] [phone]         Lavina Hamman, MD. Schedule an appointment as soon as possible for a visit in 2 week(s).   Specialty: Neurology Why: Hospital Follow Up Contact information: [address] [place] [id number] [place]  [phone]                Allergies  Allergen Reactions   Tetracyclines & Related Other (See Comments)    Pt developed gastritis while taking tetracycline   Aspirin     bruises   Norvasc [Amlodipine Besylate]     Made BP drop low   Nsaids     bruising   Dexilant [Dexlansoprazole] Diarrhea   Allergies as of 09/15/2022       Reactions   Tetracyclines & Related Other (See Comments)   Pt developed gastritis while taking tetracycline   Aspirin    bruises   Norvasc [amlodipine Besylate]    Made  BP drop low   Nsaids    bruising   Dexilant [dexlansoprazole] Diarrhea        Medication List     STOP taking these medications    fluconazole 10 MG/ML suspension Commonly known as: DIFLUCAN   methylphenidate 20 MG tablet Commonly known as: RITALIN       TAKE these medications    acetaminophen 500 MG tablet Commonly known as: TYLENOL Take 500 mg by mouth every 8 (eight) hours as needed.   amoxicillin-clavulanate 875-125 MG tablet Commonly known as: AUGMENTIN Take 1 tablet by mouth 2 (two) times daily for 3 days.   atorvastatin 20 MG tablet Commonly known as: LIPITOR Take 20 mg by mouth at bedtime.   benztropine 1 MG tablet Commonly known as: COGENTIN Take 1 mg by mouth 2 (two) times daily.   calcium carbonate 1500 (600 Ca) MG Tabs tablet Commonly known as: OSCAL Take 600 mg by mouth with breakfast.   cholecalciferol 25 MCG (1000 UNIT) tablet Commonly known as: VITAMIN D3 Take 1,000 Units by mouth daily.   docusate sodium 100 MG capsule Commonly known as: COLACE Take 1 capsule (100 mg total) by mouth 2 (two) times daily.   feeding supplement (OSMOLITE 1.2 CAL) Liqd Place 237 mLs into feeding tube 5  (five) times daily.   FLUoxetine 10 MG capsule Commonly known as: PROZAC Take 10 mg by mouth daily.   levothyroxine 50 MCG tablet Commonly known as: SYNTHROID Take 50 mcg by mouth daily before breakfast.   magnesium oxide 400 MG tablet Commonly known as: MAG-OX Take by mouth.   metoprolol succinate 50 MG 24 hr tablet Commonly known as: TOPROL-XL Take 50 mg by mouth at bedtime.   mirtazapine 30 MG tablet Commonly known as: REMERON Take 30 mg by mouth daily.   NON FORMULARY CPAP at night   omeprazole 20 MG capsule Commonly known as: PRILOSEC Take by mouth.   senna-docusate 8.6-50 MG tablet Commonly known as: Senokot-S Take 1 tablet by mouth 2 (two) times daily.   valACYclovir 1000 MG tablet Commonly known as: VALTREX Take 1,000 mg by mouth 2 (two) times daily as needed (for outbreak).        Procedures/Studies: DG Chest Portable 1 View  Result Date: 09/14/2022 CLINICAL DATA:  Altered mental status.  Possible aspiration. EXAM: PORTABLE CHEST 1 VIEW COMPARISON:  CT 05/05/2020.  Radiographs 01/31/2019 and 01/29/2019. FINDINGS: 1559 hours. The heart size and mediastinal contours are stable with aortic atherosclerosis. The pleural effusions noted on prior radiographs have resolved. The lungs appear clear. There is no pneumothorax. No acute osseous findings are identified. Mild degenerative changes are present in the spine. IMPRESSION: No evidence of acute cardiopulmonary process. Interval resolution of bilateral pleural effusions. Electronically Signed   By: Richardean Sale M.D.   On: 09/14/2022 16:17   MR Brain W and Wo Contrast  Result Date: 09/14/2022 CLINICAL DATA:  Delirium dementia work up EXAM: MRI HEAD WITHOUT AND WITH CONTRAST TECHNIQUE: Multiplanar, multiecho pulse sequences of the brain and surrounding structures were obtained without and with intravenous contrast. CONTRAST:  5.52m GADAVIST GADOBUTROL 1 MMOL/ML IV SOLN COMPARISON:  None Available. FINDINGS: Brain:  No acute infarction, hemorrhage, hydrocephalus, extra-axial collection or mass lesion. Mild for age T2/FLAIR hyperintensity in the white matter, nonspecific but compatible with chronic microvascular ischemic disease. No pathologic enhancement. Partially empty sella. Vascular: Normal flow voids. Skull and upper cervical spine: Major arterial flow voids are maintained skull base. Sinuses/Orbits: Moderate left paranasal sinus mucosal thickening. No  acute oral findings. Other: No mastoid effusions. IMPRESSION: No evidence of acute intracranial abnormality. Electronically Signed   By: Margaretha Sheffield M.D.   On: 09/14/2022 15:57     Subjective: Pt is awake, alert, eating and drinking and feels back to her baseline now, daughters at bedside with her and agree   Discharge Exam: Vitals:   09/15/22 0728 09/15/22 1103  BP:    Pulse:    Resp:    Temp: 98.3 F (36.8 C) 98 F (36.7 C)  SpO2:     Vitals:   09/15/22 0530 09/15/22 0538 09/15/22 0728 09/15/22 1103  BP: (!) 85/36 (!) 97/40    Pulse: 68 69    Resp: 14 14    Temp:   98.3 F (36.8 C) 98 F (36.7 C)  TempSrc:   Oral Axillary  SpO2: 99% 100%    Weight:      Height:       General: Pt is alert, awake, not in acute distress Cardiovascular: RRR, S1/S2 +, no rubs, no gallops Respiratory: CTA bilaterally, no wheezing, no rhonchi, deep congested cough;  Abdominal: Soft, NT, ND, bowel sounds + Extremities: no edema, no cyanosis   The results of significant diagnostics from this hospitalization (including imaging, microbiology, ancillary and laboratory) are listed below for reference.     Microbiology: Recent Results (from the past 240 hour(s))  MRSA Next Gen by PCR, Nasal     Status: None   Collection Time: 09/14/22  6:24 PM   Specimen: Nasal Mucosa; Nasal Swab  Result Value Ref Range Status   MRSA by PCR Next Gen NOT DETECTED NOT DETECTED Final    Comment: (NOTE) The GeneXpert MRSA Assay (FDA approved for NASAL specimens only), is  one component of a comprehensive MRSA colonization surveillance program. It is not intended to diagnose MRSA infection nor to guide or monitor treatment for MRSA infections. Test performance is not FDA approved in patients less than 66 years old. Performed at [facility], [address] [place], [place] [address]      Labs: BNP (last 3 results) No results for input(s): "BNP" in the last 8760 hours. Basic Metabolic Panel: Recent Labs  Lab 09/14/22 1352 09/15/22 0358  NA 141 140  K 3.9 3.5  CL 103 106  CO2 29 29  GLUCOSE 129* 122*  BUN 17 20  CREATININE 0.91 0.73  CALCIUM 9.1 8.4*   Liver Function Tests: Recent Labs  Lab 09/14/22 1352 09/15/22 0358  AST 22 16  ALT 10 9  ALKPHOS 100 77  BILITOT 0.8 0.6  PROT 7.5 5.8*  ALBUMIN 3.9 3.0*   No results for input(s): "LIPASE", "AMYLASE" in the last 168 hours. No results for input(s): "AMMONIA" in the last 168 hours. CBC: Recent Labs  Lab 09/14/22 1352 09/15/22 0358  WBC 17.9* 14.8*  NEUTROABS 16.0*  --   HGB 11.4* 9.2*  HCT 36.9 29.9*  MCV 87.2 88.7  PLT 416* 329   Cardiac Enzymes: No results for input(s): "CKTOTAL", "CKMB", "CKMBINDEX", "TROPONINI" in the last 168 hours. BNP: Invalid input(s): "POCBNP" CBG: No results for input(s): "GLUCAP" in the last 168 hours. D-Dimer No results for input(s): "DDIMER" in the last 72 hours. Hgb A1c No results for input(s): "HGBA1C" in the last 72 hours. Lipid Profile No results for input(s): "CHOL", "HDL", "LDLCALC", "TRIG", "CHOLHDL", "LDLDIRECT" in the last 72 hours. Thyroid function studies No results for input(s): "TSH", "T4TOTAL", "T3FREE", "THYROIDAB" in the last 72 hours.  Invalid input(s): "FREET3" Anemia work  up No results for input(s): "VITAMINB12", "FOLATE", "FERRITIN", "TIBC", "IRON", "RETICCTPCT" in the last 72 hours. Urinalysis    Component Value Date/Time   COLORURINE YELLOW 09/14/2022 1750   APPEARANCEUR CLOUDY (A) 09/14/2022 1750   LABSPEC 1.014  09/14/2022 1750   PHURINE 7.0 09/14/2022 1750   GLUCOSEU NEGATIVE 09/14/2022 1750   HGBUR NEGATIVE 09/14/2022 1750   BILIRUBINUR NEGATIVE 09/14/2022 1750   KETONESUR NEGATIVE 09/14/2022 1750   PROTEINUR NEGATIVE 09/14/2022 1750   NITRITE NEGATIVE 09/14/2022 1750   LEUKOCYTESUR SMALL (A) 09/14/2022 1750   Sepsis Labs Recent Labs  Lab 09/14/22 1352 09/15/22 0358  WBC 17.9* 14.8*   Microbiology Recent Results (from the past 240 hour(s))  MRSA Next Gen by PCR, Nasal     Status: None   Collection Time: 09/14/22  6:24 PM   Specimen: Nasal Mucosa; Nasal Swab  Result Value Ref Range Status   MRSA by PCR Next Gen NOT DETECTED NOT DETECTED Final    Comment: (NOTE) The GeneXpert MRSA Assay (FDA approved for NASAL specimens only), is one component of a comprehensive MRSA colonization surveillance program. It is not intended to diagnose MRSA infection nor to guide or monitor treatment for MRSA infections. Test performance is not FDA approved in patients less than 69 years old. Performed at [facility], [address], [place],  [address]    Time coordinating discharge: 38 mins  SIGNED:  Irwin Brakeman, MD  [facility] 09/15/2022, 11:17 AM How to contact the [facility] Attending or Consulting provider [place] or covering provider during after hours [place], for this patient?  Check the care team in [facility] and look for a) attending/consulting [facility] provider listed and b) the [facility] team listed Log into [email or web address] and use [place]'s universal password to access. If you do not have the password, please contact the hospital operator. Locate the [facility] provider you are looking for under [facility] and page to a number that you can be directly reached. If you still have difficulty reaching the provider, please page the [facility] (Director on Call) for the Hospitalists listed on amion for assistance.

## 2022-09-20 DIAGNOSIS — J90 Pleural effusion, not elsewhere classified: Secondary | ICD-10-CM | POA: Diagnosis not present

## 2022-09-20 DIAGNOSIS — K449 Diaphragmatic hernia without obstruction or gangrene: Secondary | ICD-10-CM | POA: Diagnosis not present

## 2022-09-20 DIAGNOSIS — Z6823 Body mass index (BMI) 23.0-23.9, adult: Secondary | ICD-10-CM | POA: Diagnosis not present

## 2022-09-20 DIAGNOSIS — T50901D Poisoning by unspecified drugs, medicaments and biological substances, accidental (unintentional), subsequent encounter: Secondary | ICD-10-CM | POA: Diagnosis not present

## 2022-09-20 DIAGNOSIS — J690 Pneumonitis due to inhalation of food and vomit: Secondary | ICD-10-CM | POA: Diagnosis not present

## 2022-09-20 DIAGNOSIS — E785 Hyperlipidemia, unspecified: Secondary | ICD-10-CM | POA: Diagnosis not present

## 2022-09-20 DIAGNOSIS — Z9981 Dependence on supplemental oxygen: Secondary | ICD-10-CM | POA: Diagnosis not present

## 2022-09-20 DIAGNOSIS — G4730 Sleep apnea, unspecified: Secondary | ICD-10-CM | POA: Diagnosis not present

## 2022-09-20 DIAGNOSIS — F32A Depression, unspecified: Secondary | ICD-10-CM | POA: Diagnosis not present

## 2022-09-20 DIAGNOSIS — R1310 Dysphagia, unspecified: Secondary | ICD-10-CM | POA: Diagnosis not present

## 2022-09-20 DIAGNOSIS — F909 Attention-deficit hyperactivity disorder, unspecified type: Secondary | ICD-10-CM | POA: Diagnosis not present

## 2022-09-20 DIAGNOSIS — I700 Atherosclerosis of aorta: Secondary | ICD-10-CM | POA: Diagnosis not present

## 2022-09-20 DIAGNOSIS — K219 Gastro-esophageal reflux disease without esophagitis: Secondary | ICD-10-CM | POA: Diagnosis not present

## 2022-09-20 DIAGNOSIS — Z9181 History of falling: Secondary | ICD-10-CM | POA: Diagnosis not present

## 2022-09-20 DIAGNOSIS — I10 Essential (primary) hypertension: Secondary | ICD-10-CM | POA: Diagnosis not present

## 2022-09-20 DIAGNOSIS — G47419 Narcolepsy without cataplexy: Secondary | ICD-10-CM | POA: Diagnosis not present

## 2022-09-20 DIAGNOSIS — E039 Hypothyroidism, unspecified: Secondary | ICD-10-CM | POA: Diagnosis not present

## 2022-09-20 DIAGNOSIS — D72829 Elevated white blood cell count, unspecified: Secondary | ICD-10-CM | POA: Diagnosis not present

## 2022-09-21 DIAGNOSIS — E785 Hyperlipidemia, unspecified: Secondary | ICD-10-CM | POA: Diagnosis not present

## 2022-09-21 DIAGNOSIS — J690 Pneumonitis due to inhalation of food and vomit: Secondary | ICD-10-CM | POA: Diagnosis not present

## 2022-09-21 DIAGNOSIS — K219 Gastro-esophageal reflux disease without esophagitis: Secondary | ICD-10-CM | POA: Diagnosis not present

## 2022-09-21 DIAGNOSIS — T50901D Poisoning by unspecified drugs, medicaments and biological substances, accidental (unintentional), subsequent encounter: Secondary | ICD-10-CM | POA: Diagnosis not present

## 2022-09-21 DIAGNOSIS — I10 Essential (primary) hypertension: Secondary | ICD-10-CM | POA: Diagnosis not present

## 2022-09-21 DIAGNOSIS — E039 Hypothyroidism, unspecified: Secondary | ICD-10-CM | POA: Diagnosis not present

## 2022-09-25 DIAGNOSIS — J690 Pneumonitis due to inhalation of food and vomit: Secondary | ICD-10-CM | POA: Diagnosis not present

## 2022-09-25 DIAGNOSIS — Z79899 Other long term (current) drug therapy: Secondary | ICD-10-CM | POA: Diagnosis not present

## 2022-09-25 DIAGNOSIS — E785 Hyperlipidemia, unspecified: Secondary | ICD-10-CM | POA: Diagnosis not present

## 2022-09-25 DIAGNOSIS — R2689 Other abnormalities of gait and mobility: Secondary | ICD-10-CM | POA: Diagnosis not present

## 2022-09-25 DIAGNOSIS — R410 Disorientation, unspecified: Secondary | ICD-10-CM | POA: Diagnosis not present

## 2022-09-25 DIAGNOSIS — E039 Hypothyroidism, unspecified: Secondary | ICD-10-CM | POA: Diagnosis not present

## 2022-09-25 DIAGNOSIS — K219 Gastro-esophageal reflux disease without esophagitis: Secondary | ICD-10-CM | POA: Diagnosis not present

## 2022-09-25 DIAGNOSIS — T50901D Poisoning by unspecified drugs, medicaments and biological substances, accidental (unintentional), subsequent encounter: Secondary | ICD-10-CM | POA: Diagnosis not present

## 2022-09-25 DIAGNOSIS — I10 Essential (primary) hypertension: Secondary | ICD-10-CM | POA: Diagnosis not present

## 2022-09-25 DIAGNOSIS — R413 Other amnesia: Secondary | ICD-10-CM | POA: Diagnosis not present

## 2022-09-26 DIAGNOSIS — E785 Hyperlipidemia, unspecified: Secondary | ICD-10-CM | POA: Diagnosis not present

## 2022-09-26 DIAGNOSIS — T50901D Poisoning by unspecified drugs, medicaments and biological substances, accidental (unintentional), subsequent encounter: Secondary | ICD-10-CM | POA: Diagnosis not present

## 2022-09-26 DIAGNOSIS — K219 Gastro-esophageal reflux disease without esophagitis: Secondary | ICD-10-CM | POA: Diagnosis not present

## 2022-09-26 DIAGNOSIS — E039 Hypothyroidism, unspecified: Secondary | ICD-10-CM | POA: Diagnosis not present

## 2022-09-26 DIAGNOSIS — J690 Pneumonitis due to inhalation of food and vomit: Secondary | ICD-10-CM | POA: Diagnosis not present

## 2022-09-26 DIAGNOSIS — I10 Essential (primary) hypertension: Secondary | ICD-10-CM | POA: Diagnosis not present

## 2022-09-27 DIAGNOSIS — I10 Essential (primary) hypertension: Secondary | ICD-10-CM | POA: Diagnosis not present

## 2022-09-27 DIAGNOSIS — T50901D Poisoning by unspecified drugs, medicaments and biological substances, accidental (unintentional), subsequent encounter: Secondary | ICD-10-CM | POA: Diagnosis not present

## 2022-09-27 DIAGNOSIS — K219 Gastro-esophageal reflux disease without esophagitis: Secondary | ICD-10-CM | POA: Diagnosis not present

## 2022-09-27 DIAGNOSIS — E785 Hyperlipidemia, unspecified: Secondary | ICD-10-CM | POA: Diagnosis not present

## 2022-09-27 DIAGNOSIS — E039 Hypothyroidism, unspecified: Secondary | ICD-10-CM | POA: Diagnosis not present

## 2022-09-27 DIAGNOSIS — J690 Pneumonitis due to inhalation of food and vomit: Secondary | ICD-10-CM | POA: Diagnosis not present

## 2022-09-29 DIAGNOSIS — Z6836 Body mass index (BMI) 36.0-36.9, adult: Secondary | ICD-10-CM | POA: Diagnosis not present

## 2022-09-29 DIAGNOSIS — U071 COVID-19: Secondary | ICD-10-CM | POA: Diagnosis not present

## 2022-09-29 DIAGNOSIS — E669 Obesity, unspecified: Secondary | ICD-10-CM | POA: Diagnosis not present

## 2022-10-01 DIAGNOSIS — E785 Hyperlipidemia, unspecified: Secondary | ICD-10-CM | POA: Diagnosis not present

## 2022-10-01 DIAGNOSIS — J690 Pneumonitis due to inhalation of food and vomit: Secondary | ICD-10-CM | POA: Diagnosis not present

## 2022-10-01 DIAGNOSIS — T50901D Poisoning by unspecified drugs, medicaments and biological substances, accidental (unintentional), subsequent encounter: Secondary | ICD-10-CM | POA: Diagnosis not present

## 2022-10-01 DIAGNOSIS — I10 Essential (primary) hypertension: Secondary | ICD-10-CM | POA: Diagnosis not present

## 2022-10-02 DIAGNOSIS — I10 Essential (primary) hypertension: Secondary | ICD-10-CM | POA: Diagnosis not present

## 2022-10-02 DIAGNOSIS — E039 Hypothyroidism, unspecified: Secondary | ICD-10-CM | POA: Diagnosis not present

## 2022-10-02 DIAGNOSIS — K219 Gastro-esophageal reflux disease without esophagitis: Secondary | ICD-10-CM | POA: Diagnosis not present

## 2022-10-02 DIAGNOSIS — T50901D Poisoning by unspecified drugs, medicaments and biological substances, accidental (unintentional), subsequent encounter: Secondary | ICD-10-CM | POA: Diagnosis not present

## 2022-10-02 DIAGNOSIS — J690 Pneumonitis due to inhalation of food and vomit: Secondary | ICD-10-CM | POA: Diagnosis not present

## 2022-10-02 DIAGNOSIS — E785 Hyperlipidemia, unspecified: Secondary | ICD-10-CM | POA: Diagnosis not present

## 2022-10-04 DIAGNOSIS — J690 Pneumonitis due to inhalation of food and vomit: Secondary | ICD-10-CM | POA: Diagnosis not present

## 2022-10-04 DIAGNOSIS — E785 Hyperlipidemia, unspecified: Secondary | ICD-10-CM | POA: Diagnosis not present

## 2022-10-04 DIAGNOSIS — E039 Hypothyroidism, unspecified: Secondary | ICD-10-CM | POA: Diagnosis not present

## 2022-10-04 DIAGNOSIS — I10 Essential (primary) hypertension: Secondary | ICD-10-CM | POA: Diagnosis not present

## 2022-10-04 DIAGNOSIS — K219 Gastro-esophageal reflux disease without esophagitis: Secondary | ICD-10-CM | POA: Diagnosis not present

## 2022-10-04 DIAGNOSIS — T50901D Poisoning by unspecified drugs, medicaments and biological substances, accidental (unintentional), subsequent encounter: Secondary | ICD-10-CM | POA: Diagnosis not present

## 2022-10-08 DIAGNOSIS — J690 Pneumonitis due to inhalation of food and vomit: Secondary | ICD-10-CM | POA: Diagnosis not present

## 2022-10-08 DIAGNOSIS — T50901D Poisoning by unspecified drugs, medicaments and biological substances, accidental (unintentional), subsequent encounter: Secondary | ICD-10-CM | POA: Diagnosis not present

## 2022-10-08 DIAGNOSIS — K219 Gastro-esophageal reflux disease without esophagitis: Secondary | ICD-10-CM | POA: Diagnosis not present

## 2022-10-08 DIAGNOSIS — E039 Hypothyroidism, unspecified: Secondary | ICD-10-CM | POA: Diagnosis not present

## 2022-10-08 DIAGNOSIS — E785 Hyperlipidemia, unspecified: Secondary | ICD-10-CM | POA: Diagnosis not present

## 2022-10-08 DIAGNOSIS — I10 Essential (primary) hypertension: Secondary | ICD-10-CM | POA: Diagnosis not present

## 2022-10-12 DIAGNOSIS — I10 Essential (primary) hypertension: Secondary | ICD-10-CM | POA: Diagnosis not present

## 2022-10-12 DIAGNOSIS — T50901D Poisoning by unspecified drugs, medicaments and biological substances, accidental (unintentional), subsequent encounter: Secondary | ICD-10-CM | POA: Diagnosis not present

## 2022-10-12 DIAGNOSIS — E039 Hypothyroidism, unspecified: Secondary | ICD-10-CM | POA: Diagnosis not present

## 2022-10-12 DIAGNOSIS — E785 Hyperlipidemia, unspecified: Secondary | ICD-10-CM | POA: Diagnosis not present

## 2022-10-12 DIAGNOSIS — J690 Pneumonitis due to inhalation of food and vomit: Secondary | ICD-10-CM | POA: Diagnosis not present

## 2022-10-12 DIAGNOSIS — K219 Gastro-esophageal reflux disease without esophagitis: Secondary | ICD-10-CM | POA: Diagnosis not present

## 2022-10-15 DIAGNOSIS — J690 Pneumonitis due to inhalation of food and vomit: Secondary | ICD-10-CM | POA: Diagnosis not present

## 2022-10-15 DIAGNOSIS — E039 Hypothyroidism, unspecified: Secondary | ICD-10-CM | POA: Diagnosis not present

## 2022-10-15 DIAGNOSIS — T50901D Poisoning by unspecified drugs, medicaments and biological substances, accidental (unintentional), subsequent encounter: Secondary | ICD-10-CM | POA: Diagnosis not present

## 2022-10-15 DIAGNOSIS — E785 Hyperlipidemia, unspecified: Secondary | ICD-10-CM | POA: Diagnosis not present

## 2022-10-15 DIAGNOSIS — I10 Essential (primary) hypertension: Secondary | ICD-10-CM | POA: Diagnosis not present

## 2022-10-15 DIAGNOSIS — K219 Gastro-esophageal reflux disease without esophagitis: Secondary | ICD-10-CM | POA: Diagnosis not present

## 2022-10-19 ENCOUNTER — Ambulatory Visit: Payer: Self-pay

## 2022-10-20 DIAGNOSIS — R1310 Dysphagia, unspecified: Secondary | ICD-10-CM | POA: Diagnosis not present

## 2022-10-20 DIAGNOSIS — E039 Hypothyroidism, unspecified: Secondary | ICD-10-CM | POA: Diagnosis not present

## 2022-10-20 DIAGNOSIS — K219 Gastro-esophageal reflux disease without esophagitis: Secondary | ICD-10-CM | POA: Diagnosis not present

## 2022-10-20 DIAGNOSIS — T50901D Poisoning by unspecified drugs, medicaments and biological substances, accidental (unintentional), subsequent encounter: Secondary | ICD-10-CM | POA: Diagnosis not present

## 2022-10-20 DIAGNOSIS — F32A Depression, unspecified: Secondary | ICD-10-CM | POA: Diagnosis not present

## 2022-10-20 DIAGNOSIS — G4730 Sleep apnea, unspecified: Secondary | ICD-10-CM | POA: Diagnosis not present

## 2022-10-20 DIAGNOSIS — J90 Pleural effusion, not elsewhere classified: Secondary | ICD-10-CM | POA: Diagnosis not present

## 2022-10-20 DIAGNOSIS — Z9181 History of falling: Secondary | ICD-10-CM | POA: Diagnosis not present

## 2022-10-20 DIAGNOSIS — K449 Diaphragmatic hernia without obstruction or gangrene: Secondary | ICD-10-CM | POA: Diagnosis not present

## 2022-10-20 DIAGNOSIS — I10 Essential (primary) hypertension: Secondary | ICD-10-CM | POA: Diagnosis not present

## 2022-10-20 DIAGNOSIS — E785 Hyperlipidemia, unspecified: Secondary | ICD-10-CM | POA: Diagnosis not present

## 2022-10-20 DIAGNOSIS — J690 Pneumonitis due to inhalation of food and vomit: Secondary | ICD-10-CM | POA: Diagnosis not present

## 2022-10-20 DIAGNOSIS — G47419 Narcolepsy without cataplexy: Secondary | ICD-10-CM | POA: Diagnosis not present

## 2022-10-20 DIAGNOSIS — D72829 Elevated white blood cell count, unspecified: Secondary | ICD-10-CM | POA: Diagnosis not present

## 2022-10-20 DIAGNOSIS — Z9981 Dependence on supplemental oxygen: Secondary | ICD-10-CM | POA: Diagnosis not present

## 2022-10-23 DIAGNOSIS — E039 Hypothyroidism, unspecified: Secondary | ICD-10-CM | POA: Diagnosis not present

## 2022-10-23 DIAGNOSIS — K219 Gastro-esophageal reflux disease without esophagitis: Secondary | ICD-10-CM | POA: Diagnosis not present

## 2022-10-23 DIAGNOSIS — I10 Essential (primary) hypertension: Secondary | ICD-10-CM | POA: Diagnosis not present

## 2022-10-23 DIAGNOSIS — T50901D Poisoning by unspecified drugs, medicaments and biological substances, accidental (unintentional), subsequent encounter: Secondary | ICD-10-CM | POA: Diagnosis not present

## 2022-10-23 DIAGNOSIS — E785 Hyperlipidemia, unspecified: Secondary | ICD-10-CM | POA: Diagnosis not present

## 2022-10-23 DIAGNOSIS — J690 Pneumonitis due to inhalation of food and vomit: Secondary | ICD-10-CM | POA: Diagnosis not present

## 2022-10-25 DIAGNOSIS — I10 Essential (primary) hypertension: Secondary | ICD-10-CM | POA: Diagnosis not present

## 2022-10-25 DIAGNOSIS — J690 Pneumonitis due to inhalation of food and vomit: Secondary | ICD-10-CM | POA: Diagnosis not present

## 2022-10-25 DIAGNOSIS — E039 Hypothyroidism, unspecified: Secondary | ICD-10-CM | POA: Diagnosis not present

## 2022-10-25 DIAGNOSIS — E785 Hyperlipidemia, unspecified: Secondary | ICD-10-CM | POA: Diagnosis not present

## 2022-10-25 DIAGNOSIS — T50901D Poisoning by unspecified drugs, medicaments and biological substances, accidental (unintentional), subsequent encounter: Secondary | ICD-10-CM | POA: Diagnosis not present

## 2022-10-25 DIAGNOSIS — K219 Gastro-esophageal reflux disease without esophagitis: Secondary | ICD-10-CM | POA: Diagnosis not present

## 2022-10-31 DIAGNOSIS — K220 Achalasia of cardia: Secondary | ICD-10-CM | POA: Diagnosis not present

## 2022-11-08 DIAGNOSIS — R4182 Altered mental status, unspecified: Secondary | ICD-10-CM | POA: Diagnosis not present

## 2022-11-19 DIAGNOSIS — R4182 Altered mental status, unspecified: Secondary | ICD-10-CM | POA: Diagnosis not present

## 2023-01-02 DIAGNOSIS — H2512 Age-related nuclear cataract, left eye: Secondary | ICD-10-CM | POA: Diagnosis not present

## 2023-01-28 DIAGNOSIS — R2689 Other abnormalities of gait and mobility: Secondary | ICD-10-CM | POA: Diagnosis not present

## 2023-01-28 DIAGNOSIS — R413 Other amnesia: Secondary | ICD-10-CM | POA: Diagnosis not present

## 2023-02-06 DIAGNOSIS — H2511 Age-related nuclear cataract, right eye: Secondary | ICD-10-CM | POA: Diagnosis not present

## 2023-03-26 DIAGNOSIS — H33002 Unspecified retinal detachment with retinal break, left eye: Secondary | ICD-10-CM | POA: Diagnosis not present

## 2023-04-03 DIAGNOSIS — H3342 Traction detachment of retina, left eye: Secondary | ICD-10-CM | POA: Diagnosis not present

## 2023-04-09 DIAGNOSIS — H3342 Traction detachment of retina, left eye: Secondary | ICD-10-CM | POA: Diagnosis not present

## 2023-04-17 DIAGNOSIS — H3342 Traction detachment of retina, left eye: Secondary | ICD-10-CM | POA: Diagnosis not present

## 2023-04-22 DIAGNOSIS — E559 Vitamin D deficiency, unspecified: Secondary | ICD-10-CM | POA: Diagnosis not present

## 2023-04-22 DIAGNOSIS — Z23 Encounter for immunization: Secondary | ICD-10-CM | POA: Diagnosis not present

## 2023-04-22 DIAGNOSIS — Z1331 Encounter for screening for depression: Secondary | ICD-10-CM | POA: Diagnosis not present

## 2023-04-22 DIAGNOSIS — Z6825 Body mass index (BMI) 25.0-25.9, adult: Secondary | ICD-10-CM | POA: Diagnosis not present

## 2023-04-22 DIAGNOSIS — G47419 Narcolepsy without cataplexy: Secondary | ICD-10-CM | POA: Diagnosis not present

## 2023-04-22 DIAGNOSIS — Z0001 Encounter for general adult medical examination with abnormal findings: Secondary | ICD-10-CM | POA: Diagnosis not present

## 2023-04-22 DIAGNOSIS — Z9229 Personal history of other drug therapy: Secondary | ICD-10-CM | POA: Diagnosis not present

## 2023-04-22 DIAGNOSIS — F909 Attention-deficit hyperactivity disorder, unspecified type: Secondary | ICD-10-CM | POA: Diagnosis not present

## 2023-04-22 DIAGNOSIS — E785 Hyperlipidemia, unspecified: Secondary | ICD-10-CM | POA: Diagnosis not present

## 2023-04-22 DIAGNOSIS — E782 Mixed hyperlipidemia: Secondary | ICD-10-CM | POA: Diagnosis not present

## 2023-04-22 DIAGNOSIS — I10 Essential (primary) hypertension: Secondary | ICD-10-CM | POA: Diagnosis not present

## 2023-04-22 DIAGNOSIS — J449 Chronic obstructive pulmonary disease, unspecified: Secondary | ICD-10-CM | POA: Diagnosis not present

## 2023-05-15 DIAGNOSIS — H3342 Traction detachment of retina, left eye: Secondary | ICD-10-CM | POA: Diagnosis not present

## 2023-05-30 DIAGNOSIS — I10 Essential (primary) hypertension: Secondary | ICD-10-CM | POA: Diagnosis not present

## 2023-05-30 DIAGNOSIS — E782 Mixed hyperlipidemia: Secondary | ICD-10-CM | POA: Diagnosis not present

## 2023-05-30 DIAGNOSIS — G2571 Drug induced akathisia: Secondary | ICD-10-CM | POA: Diagnosis not present

## 2023-06-07 DIAGNOSIS — I10 Essential (primary) hypertension: Secondary | ICD-10-CM | POA: Diagnosis not present

## 2023-06-07 DIAGNOSIS — R0789 Other chest pain: Secondary | ICD-10-CM | POA: Diagnosis not present

## 2023-06-07 DIAGNOSIS — E782 Mixed hyperlipidemia: Secondary | ICD-10-CM | POA: Diagnosis not present

## 2023-06-07 DIAGNOSIS — E663 Overweight: Secondary | ICD-10-CM | POA: Diagnosis not present

## 2023-06-07 DIAGNOSIS — Z6827 Body mass index (BMI) 27.0-27.9, adult: Secondary | ICD-10-CM | POA: Diagnosis not present

## 2023-06-19 DIAGNOSIS — Z23 Encounter for immunization: Secondary | ICD-10-CM | POA: Diagnosis not present

## 2023-06-29 DIAGNOSIS — I10 Essential (primary) hypertension: Secondary | ICD-10-CM | POA: Diagnosis not present

## 2023-06-29 DIAGNOSIS — I700 Atherosclerosis of aorta: Secondary | ICD-10-CM | POA: Diagnosis not present

## 2023-07-06 DIAGNOSIS — Z1231 Encounter for screening mammogram for malignant neoplasm of breast: Secondary | ICD-10-CM | POA: Diagnosis not present

## 2023-07-10 DIAGNOSIS — J0100 Acute maxillary sinusitis, unspecified: Secondary | ICD-10-CM | POA: Diagnosis not present

## 2023-07-10 DIAGNOSIS — E663 Overweight: Secondary | ICD-10-CM | POA: Diagnosis not present

## 2023-07-10 DIAGNOSIS — Z6827 Body mass index (BMI) 27.0-27.9, adult: Secondary | ICD-10-CM | POA: Diagnosis not present

## 2023-07-10 DIAGNOSIS — F909 Attention-deficit hyperactivity disorder, unspecified type: Secondary | ICD-10-CM | POA: Diagnosis not present

## 2023-07-10 DIAGNOSIS — J441 Chronic obstructive pulmonary disease with (acute) exacerbation: Secondary | ICD-10-CM | POA: Diagnosis not present

## 2023-07-10 DIAGNOSIS — G47419 Narcolepsy without cataplexy: Secondary | ICD-10-CM | POA: Diagnosis not present

## 2023-07-10 DIAGNOSIS — I10 Essential (primary) hypertension: Secondary | ICD-10-CM | POA: Diagnosis not present

## 2023-07-10 DIAGNOSIS — J45909 Unspecified asthma, uncomplicated: Secondary | ICD-10-CM | POA: Diagnosis not present

## 2023-07-10 DIAGNOSIS — J449 Chronic obstructive pulmonary disease, unspecified: Secondary | ICD-10-CM | POA: Diagnosis not present

## 2023-07-30 DIAGNOSIS — J449 Chronic obstructive pulmonary disease, unspecified: Secondary | ICD-10-CM | POA: Diagnosis not present

## 2023-07-30 DIAGNOSIS — E782 Mixed hyperlipidemia: Secondary | ICD-10-CM | POA: Diagnosis not present

## 2023-07-30 DIAGNOSIS — I10 Essential (primary) hypertension: Secondary | ICD-10-CM | POA: Diagnosis not present

## 2023-08-14 DIAGNOSIS — H3342 Traction detachment of retina, left eye: Secondary | ICD-10-CM | POA: Diagnosis not present

## 2023-08-16 DIAGNOSIS — I341 Nonrheumatic mitral (valve) prolapse: Secondary | ICD-10-CM | POA: Diagnosis not present

## 2023-08-16 DIAGNOSIS — I10 Essential (primary) hypertension: Secondary | ICD-10-CM | POA: Diagnosis not present

## 2023-08-16 DIAGNOSIS — G4733 Obstructive sleep apnea (adult) (pediatric): Secondary | ICD-10-CM | POA: Diagnosis not present

## 2023-08-16 DIAGNOSIS — Z6828 Body mass index (BMI) 28.0-28.9, adult: Secondary | ICD-10-CM | POA: Diagnosis not present

## 2023-08-16 DIAGNOSIS — J449 Chronic obstructive pulmonary disease, unspecified: Secondary | ICD-10-CM | POA: Diagnosis not present

## 2023-08-16 DIAGNOSIS — E039 Hypothyroidism, unspecified: Secondary | ICD-10-CM | POA: Diagnosis not present

## 2023-08-16 DIAGNOSIS — D508 Other iron deficiency anemias: Secondary | ICD-10-CM | POA: Diagnosis not present

## 2023-08-16 DIAGNOSIS — E782 Mixed hyperlipidemia: Secondary | ICD-10-CM | POA: Diagnosis not present

## 2023-08-16 DIAGNOSIS — E663 Overweight: Secondary | ICD-10-CM | POA: Diagnosis not present

## 2023-08-16 DIAGNOSIS — R0609 Other forms of dyspnea: Secondary | ICD-10-CM | POA: Diagnosis not present

## 2023-09-02 ENCOUNTER — Other Ambulatory Visit (HOSPITAL_COMMUNITY): Payer: Self-pay | Admitting: Family Medicine

## 2023-09-02 DIAGNOSIS — R0789 Other chest pain: Secondary | ICD-10-CM

## 2023-09-02 DIAGNOSIS — J449 Chronic obstructive pulmonary disease, unspecified: Secondary | ICD-10-CM

## 2023-09-02 DIAGNOSIS — R0609 Other forms of dyspnea: Secondary | ICD-10-CM

## 2023-09-05 ENCOUNTER — Ambulatory Visit (HOSPITAL_COMMUNITY)
Admission: RE | Admit: 2023-09-05 | Discharge: 2023-09-05 | Disposition: A | Discharge status: Home / Self Care | Payer: Medicare Other | Source: Ambulatory Visit | Attending: Family Medicine | Admitting: Family Medicine

## 2023-09-05 DIAGNOSIS — R0609 Other forms of dyspnea: Secondary | ICD-10-CM | POA: Insufficient documentation

## 2023-09-05 DIAGNOSIS — J449 Chronic obstructive pulmonary disease, unspecified: Secondary | ICD-10-CM | POA: Diagnosis not present

## 2023-09-05 DIAGNOSIS — R0789 Other chest pain: Secondary | ICD-10-CM | POA: Insufficient documentation

## 2023-09-05 DIAGNOSIS — R079 Chest pain, unspecified: Secondary | ICD-10-CM | POA: Diagnosis not present

## 2023-09-05 DIAGNOSIS — K449 Diaphragmatic hernia without obstruction or gangrene: Secondary | ICD-10-CM | POA: Diagnosis not present

## 2023-09-26 DIAGNOSIS — I679 Cerebrovascular disease, unspecified: Secondary | ICD-10-CM | POA: Diagnosis not present

## 2023-09-26 DIAGNOSIS — F0670 Mild neurocognitive disorder due to known physiological condition without behavioral disturbance: Secondary | ICD-10-CM | POA: Diagnosis not present

## 2023-09-26 DIAGNOSIS — G4733 Obstructive sleep apnea (adult) (pediatric): Secondary | ICD-10-CM | POA: Diagnosis not present

## 2023-10-18 DIAGNOSIS — H04123 Dry eye syndrome of bilateral lacrimal glands: Secondary | ICD-10-CM | POA: Diagnosis not present

## 2023-10-29 DIAGNOSIS — G4733 Obstructive sleep apnea (adult) (pediatric): Secondary | ICD-10-CM | POA: Diagnosis not present

## 2023-10-29 DIAGNOSIS — R0609 Other forms of dyspnea: Secondary | ICD-10-CM | POA: Diagnosis not present

## 2023-10-29 DIAGNOSIS — Z6829 Body mass index (BMI) 29.0-29.9, adult: Secondary | ICD-10-CM | POA: Diagnosis not present

## 2023-10-29 DIAGNOSIS — J0100 Acute maxillary sinusitis, unspecified: Secondary | ICD-10-CM | POA: Diagnosis not present

## 2023-10-29 DIAGNOSIS — J45909 Unspecified asthma, uncomplicated: Secondary | ICD-10-CM | POA: Diagnosis not present

## 2023-10-29 DIAGNOSIS — G47419 Narcolepsy without cataplexy: Secondary | ICD-10-CM | POA: Diagnosis not present

## 2023-10-29 DIAGNOSIS — I10 Essential (primary) hypertension: Secondary | ICD-10-CM | POA: Diagnosis not present

## 2023-10-29 DIAGNOSIS — E663 Overweight: Secondary | ICD-10-CM | POA: Diagnosis not present

## 2023-10-29 DIAGNOSIS — J441 Chronic obstructive pulmonary disease with (acute) exacerbation: Secondary | ICD-10-CM | POA: Diagnosis not present

## 2023-10-29 DIAGNOSIS — I341 Nonrheumatic mitral (valve) prolapse: Secondary | ICD-10-CM | POA: Diagnosis not present

## 2023-11-11 ENCOUNTER — Ambulatory Visit (HOSPITAL_COMMUNITY)
Admission: RE | Admit: 2023-11-11 | Discharge: 2023-11-11 | Disposition: A | Discharge status: Home / Self Care | Source: Ambulatory Visit | Attending: Internal Medicine | Admitting: Internal Medicine

## 2023-11-11 ENCOUNTER — Other Ambulatory Visit (HOSPITAL_COMMUNITY): Payer: Self-pay | Admitting: Internal Medicine

## 2023-11-11 DIAGNOSIS — J441 Chronic obstructive pulmonary disease with (acute) exacerbation: Secondary | ICD-10-CM

## 2023-11-11 DIAGNOSIS — R0602 Shortness of breath: Secondary | ICD-10-CM | POA: Diagnosis not present

## 2023-11-11 DIAGNOSIS — J0100 Acute maxillary sinusitis, unspecified: Secondary | ICD-10-CM | POA: Diagnosis not present

## 2023-11-11 DIAGNOSIS — R059 Cough, unspecified: Secondary | ICD-10-CM | POA: Diagnosis not present

## 2023-11-11 DIAGNOSIS — I10 Essential (primary) hypertension: Secondary | ICD-10-CM | POA: Diagnosis not present

## 2023-11-11 DIAGNOSIS — Z6830 Body mass index (BMI) 30.0-30.9, adult: Secondary | ICD-10-CM | POA: Diagnosis not present

## 2023-11-11 DIAGNOSIS — J45909 Unspecified asthma, uncomplicated: Secondary | ICD-10-CM | POA: Diagnosis not present

## 2023-11-11 DIAGNOSIS — E6609 Other obesity due to excess calories: Secondary | ICD-10-CM | POA: Diagnosis not present

## 2023-11-11 DIAGNOSIS — R062 Wheezing: Secondary | ICD-10-CM | POA: Diagnosis not present

## 2023-11-20 ENCOUNTER — Encounter: Payer: Self-pay | Admitting: Internal Medicine

## 2023-11-20 ENCOUNTER — Ambulatory Visit: Payer: Medicare Other | Attending: Internal Medicine | Admitting: Internal Medicine

## 2023-11-20 VITALS — BP 112/68 | Ht 60.5 in | Wt 165.0 lb

## 2023-11-20 DIAGNOSIS — R0609 Other forms of dyspnea: Secondary | ICD-10-CM | POA: Diagnosis not present

## 2023-11-20 DIAGNOSIS — I209 Angina pectoris, unspecified: Secondary | ICD-10-CM | POA: Insufficient documentation

## 2023-11-20 DIAGNOSIS — R079 Chest pain, unspecified: Secondary | ICD-10-CM | POA: Insufficient documentation

## 2023-11-20 NOTE — Progress Notes (Signed)
 Cardiology Office Note  Date: 11/20/2023   ID: Allye, Hoyos February 28, 1951, MRN [id number]  PCP:  Kathyleen Parkins, MD  Cardiologist:  Lasalle Pointer, MD Electrophysiologist:  None   History of Present Illness: JLYN CERROS is a 73 y.o. female  Ongoing DOE for the last 1 year.  Not able to do household chores.  Exertion is intolerance present.  No orthopnea, PND, leg swelling.  Occasional chest tightness.  No dizziness, syncope, palpitations.  Former smoker, quit many years ago.  No prior ischemia evaluation.  No prior history of MI/PCI/CABG.  Past Medical History:  Diagnosis Date   Allergy    Anemia    Anginal pain (HCC) 2015   Depression    Dyslipidemia 05/10/2020   Dysrhythmia 2015   SVT   Essential hypertension 05/10/2020   GERD (gastroesophageal reflux disease)    Hiatal hernia    History of degenerative disc disease    History of nuclear stress test 07/27/2013   in care everywhere per cardiology note by dr Santo Cullen:  test normal   Hyperlipidemia    Hypertension    Hypothyroidism    Hypothyroidism (acquired) 05/10/2020   Intermittent palpitations    MVP (mitral valve prolapse)    per pt dx 1980s,  last echo done 2016 by dr Santo Cullen [facility] cardiology in [place]) and last cardiology visit 11-02-2014 in epic;   01-27-2019 per pt episodic palpitations and takes toprol , currently followed by pcp   Narcolepsy    OSA (obstructive sleep apnea)    Not on CPAP   Osteoporosis    osteopenia   TMJ arthralgia     Past Surgical History:  Procedure Laterality Date   ABDOMINAL HYSTERECTOMY     BIOPSY  06/24/2018   Procedure: BIOPSY;  Surgeon: Suzette Espy, MD;  [facility];  Service: Endoscopy;;  (gastric) (colon)   BREAST CYST ASPIRATION     COLONOSCOPY N/A 06/24/2018   Procedure: COLONOSCOPY;  Surgeon: Suzette Espy, MD;  [facility];  Service: Endoscopy;  Laterality: N/A;  1:45pm   ESOPHAGEAL DILATION  09/02/2019   Procedure: ESOPHAGEAL  DILATION;  Surgeon: Sergio Dandy, MD;  [facility]: [facility];  Service: Endoscopy;;   ESOPHAGEAL MANOMETRY N/A 09/02/2019   Procedure: ESOPHAGEAL MANOMETRY (EM);  Surgeon: Sergio Dandy, MD;  [facility];  Service: Endoscopy;  Laterality: N/A;   ESOPHAGEAL MANOMETRY N/A 02/06/2021   Procedure: ESOPHAGEAL MANOMETRY (EM);  Surgeon: Sergio Dandy, MD;  [facility]: [facility];  Service: Endoscopy;  Laterality: N/A;   ESOPHAGOGASTRODUODENOSCOPY N/A 06/24/2018   Procedure: ESOPHAGOGASTRODUODENOSCOPY (EGD);  Surgeon: Suzette Espy, MD;  [facility];  Service: Endoscopy;  Laterality: N/A;   ESOPHAGOGASTRODUODENOSCOPY N/A 07/14/2019   Procedure: ESOPHAGOGASTRODUODENOSCOPY (EGD);  Surgeon: Suzette Espy, MD;  [facility];  Service: Endoscopy;  Laterality: N/A;  3:00pm   ESOPHAGOGASTRODUODENOSCOPY (EGD) WITH PROPOFOL  N/A 09/02/2019   Procedure: ESOPHAGOGASTRODUODENOSCOPY (EGD) WITH PROPOFOL ;  Surgeon: Sergio Dandy, MD;  [facility];  Service: Endoscopy;  Laterality: N/A;   mano probe placement   EYE SURGERY Bilateral    rk   IR GASTROSTOMY TUBE MOD SED  05/10/2020   IR RADIOLOGIST EVAL & MGMT  05/26/2020   MALONEY DILATION N/A 06/24/2018   Procedure: Londa Rival DILATION;  Surgeon: Suzette Espy, MD;  [facility];  Service: Endoscopy;  Laterality: N/A;   MALONEY DILATION N/A 07/14/2019   Procedure: Londa Rival DILATION;  Surgeon: Suzette Espy,  MD;  [facility];  Service: Endoscopy;  Laterality: N/A;   NISSEN FUNDOPLICATION  09/2019   takedown   POLYPECTOMY  06/24/2018   Procedure: POLYPECTOMY;  Surgeon: Suzette Espy, MD;  [facility];  Service: Endoscopy;;  colon    RF ablation of cervical nerves     ROTATOR CUFF REPAIR Right 11-21-2006  dr supple @[facility]     Current Outpatient Medications  Medication Sig Dispense Refill   acetaminophen  (TYLENOL ) 500 MG tablet Take 500 mg by mouth every 8  (eight) hours as needed.     albuterol  (PROVENTIL ) 2 MG tablet Take 2 mg by mouth 3 (three) times daily.     albuterol  (VENTOLIN  HFA) 108 (90 Base) MCG/ACT inhaler Inhale 2 puffs into the lungs every 4 (four) hours as needed.     atorvastatin  (LIPITOR) 20 MG tablet Take 20 mg by mouth at bedtime.      calcium  carbonate (OSCAL) 1500 (600 Ca) MG TABS tablet Take 600 mg by mouth with breakfast.     cholecalciferol (VITAMIN D3) 25 MCG (1000 UT) tablet Take 1,000 Units by mouth daily.     docusate sodium  (COLACE) 100 MG capsule Take 1 capsule (100 mg total) by mouth 2 (two) times daily. 10 capsule 0   FLUoxetine  (PROZAC ) 10 MG capsule Take 10 mg by mouth daily.   0   levothyroxine  (SYNTHROID , LEVOTHROID) 50 MCG tablet Take 50 mcg by mouth daily before breakfast.   2   magnesium  oxide (MAG-OX) 400 MG tablet Take by mouth.     metoprolol  succinate (TOPROL -XL) 50 MG 24 hr tablet Take 50 mg by mouth at bedtime.   10   mirtazapine (REMERON) 30 MG tablet Take 30 mg by mouth daily.     NON FORMULARY CPAP at night     Nutritional Supplements (FEEDING SUPPLEMENT, OSMOLITE 1.2 CAL,) LIQD Place 237 mLs into feeding tube 5 (five) times daily. 35000 mL 0   omeprazole (PRILOSEC) 20 MG capsule Take by mouth.     senna-docusate (SENOKOT-S) 8.6-50 MG tablet Take 1 tablet by mouth 2 (two) times daily.     valACYclovir (VALTREX) 1000 MG tablet Take 1,000 mg by mouth 2 (two) times daily as needed (for outbreak).      No current facility-administered medications for this visit.   Allergies:  Tetracyclines & related, Aspirin, Norvasc [amlodipine besylate], Nsaids, and Dexilant  [dexlansoprazole ]   Social History: The patient  reports that she quit smoking about 26 years ago. Her smoking use included cigarettes. She started smoking about 55 years ago. She has a 43.5 pack-year smoking history. She has never used smokeless tobacco. She reports that she does not currently use alcohol. She reports that she does not use drugs.    Family History: The patient's family history includes Alzheimer's disease in her mother; Breast cancer in her maternal grandmother; COPD in her father; Colon cancer in her maternal grandfather and paternal grandmother; Heart disease in her father; Liver cancer in her brother.   ROS:  Please see the history of present illness. Otherwise, complete review of systems is positive for none.  All other systems are reviewed and negative.   Physical Exam: VS:  BP 112/68 (BP Location: Left Arm, Patient Position: Sitting, Cuff Size: Normal)   Ht 5' 0.5" (1.537 m)   Wt 165 lb (74.8 kg)   SpO2 94%   BMI 31.69 kg/m , BMI Body mass index is 31.69 kg/m.  Wt Readings from Last 3 Encounters:  11/20/23 165  lb (74.8 kg)  09/14/22 125 lb 14.1 oz (57.1 kg)  05/11/20 119 lb 7.8 oz (54.2 kg)    General: Patient appears comfortable at rest. HEENT: Conjunctiva and lids normal, oropharynx clear with moist mucosa. Neck: Supple, no elevated JVP or carotid bruits, no thyromegaly. Lungs: Clear to auscultation, nonlabored breathing at rest. Cardiac: Regular rate and rhythm, no S3 or significant systolic murmur, no pericardial rub. Abdomen: Soft, nontender, no hepatomegaly, bowel sounds present, no guarding or rebound. Extremities: No pitting edema, distal pulses 2+. Skin: Warm and dry. Musculoskeletal: No kyphosis. Neuropsychiatric: Alert and oriented x3, affect grossly appropriate.  Recent Labwork: No results found for requested labs within last 365 days.  No results found for: "CHOL", "TRIG", "HDL", "CHOLHDL", "VLDL", "LDLCALC", "LDLDIRECT"   Assessment and Plan:  DOE: Ongoing DOE for the last 1 year, recently worsening.  Exercise intolerance present.  Not able to do household chores.  Occasional chest tightness.  Obtain echocardiogram and CT cardiac for ischemia evaluation.  HTN, controlled: Continue metoprolol  succinate 50 mg once daily.  Follows up with PCP.  HLD, unknown values: Continue  atorvastatin  20 mg at bedtime.  Goal LDL less than 100.  Follows with PCP.    Medication Adjustments/Labs and Tests Ordered: Current medicines are reviewed at length with the patient today.  Concerns regarding medicines are outlined above.    Disposition:  Follow up 3 months  Signed, Harlynn Kimbell Beauford Bounds, MD, 11/20/2023 4:06 PM    [facility] [address] [place], [place] [address]

## 2023-11-20 NOTE — Patient Instructions (Addendum)
 Medication Instructions:  Your physician recommends that you continue on your current medications as directed. Please refer to the Current Medication list given to you today.  *If you need a refill on your cardiac medications before your next appointment, please call your pharmacy*  Lab Work: BMET  If you have labs (blood work) drawn today and your tests are completely normal, you will receive your results only by: MyChart Message (if you have MyChart) OR A paper copy in the mail If you have any lab test that is abnormal or we need to change your treatment, we will call you to review the results.  Testing/Procedures: Your physician has requested that you have an echocardiogram. Echocardiography is a painless test that uses sound waves to create images of your heart. It provides your doctor with information about the size and shape of your heart and how well your heart's chambers and valves are working. This procedure takes approximately one hour. There are no restrictions for this procedure. Please do NOT wear cologne, perfume, aftershave, or lotions (deodorant is allowed). Please arrive 15 minutes prior to your appointment time.  Please note: We ask at that you not bring children with you during ultrasound (echo/ vascular) testing. Due to room size and safety concerns, children are not allowed in the ultrasound rooms during exams. Our front office staff cannot provide observation of children in our lobby area while testing is being conducted. An adult accompanying a patient to their appointment will only be allowed in the ultrasound room at the discretion of the ultrasound technician under special circumstances. We apologize for any inconvenience.  Coronary CTA   Follow-Up: At [facility], you and your health needs are our priority.  As part of our continuing mission to provide you with exceptional heart care, our providers are all part of one team.  This team includes your primary  Cardiologist (physician) and Advanced Practice Providers or APPs (Physician Assistants and Nurse Practitioners) who all work together to provide you with the care you need, when you need it.  Your next appointment:   3 month(s)  Provider:   You may see Vishnu P Mallipeddi, MD or one of the following Advanced Practice Providers on your designated Care Team:   [place], PA-C  Scotesia Dateland, [place] Theotis Flake, [place]     We recommend signing up for the patient portal called "MyChart".  Sign up information is provided on this After Visit Summary.  MyChart is used to connect with patients for Virtual Visits (Telemedicine).  Patients are able to view lab/test results, encounter notes, upcoming appointments, etc.  Non-urgent messages can be sent to your provider as well.   To learn more about what you can do with MyChart, go to [email or web address].   Other Instructions     Your cardiac CT will be scheduled at one of the below locations:   [facility] [address]  [place], [place] [address] Opening November 25, 2023  If scheduled at the Heart and Vascular Tower at [employer], please enter the parking lot using the [address] street entrance and use the FREE valet service at the patient drop-off area. Enter the buidling and check-in with registration on the main floor.   Please follow these instructions carefully (unless otherwise directed):   On the Night Before the Test: Be sure to Drink plenty of water . Do not consume any caffeinated/decaffeinated beverages or chocolate 12 hours prior to your test. Do not take any antihistamines 12  hours prior to your test.   On the Day of the Test: Drink plenty of water  until 1 hour prior to the test. Do not eat any food 1 hour prior to test. You may take your regular medications prior to the test.  Take metoprolol  (Lopressor ) two hours prior to test. If you take  Furosemide /Hydrochlorothiazide /Spironolactone/Chlorthalidone, please HOLD on the morning of the test. Patients who wear a continuous glucose monitor MUST remove the device prior to scanning. FEMALES- please wear underwire-free bra if available, avoid dresses & tight clothing        After the Test: Drink plenty of water . After receiving IV contrast, you may experience a mild flushed feeling. This is normal. On occasion, you may experience a mild rash up to 24 hours after the test. This is not dangerous. If this occurs, you can take Benadryl  25 mg, Zyrtec, Claritin, or Allegra and increase your fluid intake. (Patients taking Tikosyn should avoid Benadryl , and may take Zyrtec, Claritin, or Allegra) If you experience trouble breathing, this can be serious. If it is severe call 911 IMMEDIATELY. If it is mild, please call our office.  We will call to schedule your test 2-4 weeks out understanding that some insurance companies will need an authorization prior to the service being performed.   For more information and frequently asked questions, please visit our website : [email or web address]  For non-scheduling related questions, please contact the cardiac imaging nurse navigator should you have any questions/concerns: Cardiac Imaging Nurse Navigators Direct Office Dial: [phone]   For scheduling needs, including cancellations and rescheduling, please call [place], [phone].

## 2023-11-24 ENCOUNTER — Emergency Department (HOSPITAL_COMMUNITY)

## 2023-11-24 ENCOUNTER — Other Ambulatory Visit: Payer: Self-pay

## 2023-11-24 ENCOUNTER — Inpatient Hospital Stay (HOSPITAL_COMMUNITY)
Admission: EM | Admit: 2023-11-24 | Discharge: 2023-11-26 | DRG: 190 | Disposition: A | Discharge status: Home / Self Care | Attending: Family Medicine | Admitting: Family Medicine

## 2023-11-24 ENCOUNTER — Encounter (HOSPITAL_COMMUNITY): Payer: Self-pay | Admitting: Emergency Medicine

## 2023-11-24 DIAGNOSIS — Z820 Family history of epilepsy and other diseases of the nervous system: Secondary | ICD-10-CM | POA: Diagnosis not present

## 2023-11-24 DIAGNOSIS — Z1152 Encounter for screening for COVID-19: Secondary | ICD-10-CM

## 2023-11-24 DIAGNOSIS — Z79890 Hormone replacement therapy (postmenopausal): Secondary | ICD-10-CM | POA: Diagnosis not present

## 2023-11-24 DIAGNOSIS — R4189 Other symptoms and signs involving cognitive functions and awareness: Secondary | ICD-10-CM | POA: Diagnosis not present

## 2023-11-24 DIAGNOSIS — J441 Chronic obstructive pulmonary disease with (acute) exacerbation: Secondary | ICD-10-CM | POA: Diagnosis not present

## 2023-11-24 DIAGNOSIS — R0902 Hypoxemia: Principal | ICD-10-CM

## 2023-11-24 DIAGNOSIS — Z888 Allergy status to other drugs, medicaments and biological substances status: Secondary | ICD-10-CM

## 2023-11-24 DIAGNOSIS — J45901 Unspecified asthma with (acute) exacerbation: Secondary | ICD-10-CM | POA: Diagnosis present

## 2023-11-24 DIAGNOSIS — E876 Hypokalemia: Secondary | ICD-10-CM | POA: Diagnosis not present

## 2023-11-24 DIAGNOSIS — F32A Depression, unspecified: Secondary | ICD-10-CM | POA: Diagnosis present

## 2023-11-24 DIAGNOSIS — J4489 Other specified chronic obstructive pulmonary disease: Secondary | ICD-10-CM | POA: Diagnosis not present

## 2023-11-24 DIAGNOSIS — Z886 Allergy status to analgesic agent status: Secondary | ICD-10-CM

## 2023-11-24 DIAGNOSIS — J4541 Moderate persistent asthma with (acute) exacerbation: Secondary | ICD-10-CM

## 2023-11-24 DIAGNOSIS — E039 Hypothyroidism, unspecified: Secondary | ICD-10-CM | POA: Diagnosis present

## 2023-11-24 DIAGNOSIS — I341 Nonrheumatic mitral (valve) prolapse: Secondary | ICD-10-CM | POA: Diagnosis not present

## 2023-11-24 DIAGNOSIS — E785 Hyperlipidemia, unspecified: Secondary | ICD-10-CM | POA: Diagnosis present

## 2023-11-24 DIAGNOSIS — Z881 Allergy status to other antibiotic agents status: Secondary | ICD-10-CM

## 2023-11-24 DIAGNOSIS — Z800 Family history of malignant neoplasm of digestive organs: Secondary | ICD-10-CM

## 2023-11-24 DIAGNOSIS — K219 Gastro-esophageal reflux disease without esophagitis: Secondary | ICD-10-CM | POA: Diagnosis not present

## 2023-11-24 DIAGNOSIS — Z8249 Family history of ischemic heart disease and other diseases of the circulatory system: Secondary | ICD-10-CM | POA: Diagnosis not present

## 2023-11-24 DIAGNOSIS — R1310 Dysphagia, unspecified: Secondary | ICD-10-CM | POA: Diagnosis not present

## 2023-11-24 DIAGNOSIS — G4733 Obstructive sleep apnea (adult) (pediatric): Secondary | ICD-10-CM | POA: Diagnosis present

## 2023-11-24 DIAGNOSIS — J9601 Acute respiratory failure with hypoxia: Secondary | ICD-10-CM | POA: Diagnosis not present

## 2023-11-24 DIAGNOSIS — R0602 Shortness of breath: Secondary | ICD-10-CM | POA: Diagnosis not present

## 2023-11-24 DIAGNOSIS — Z79899 Other long term (current) drug therapy: Secondary | ICD-10-CM | POA: Diagnosis not present

## 2023-11-24 DIAGNOSIS — Z825 Family history of asthma and other chronic lower respiratory diseases: Secondary | ICD-10-CM

## 2023-11-24 DIAGNOSIS — I10 Essential (primary) hypertension: Secondary | ICD-10-CM | POA: Diagnosis not present

## 2023-11-24 DIAGNOSIS — J40 Bronchitis, not specified as acute or chronic: Secondary | ICD-10-CM

## 2023-11-24 DIAGNOSIS — Z803 Family history of malignant neoplasm of breast: Secondary | ICD-10-CM

## 2023-11-24 DIAGNOSIS — M810 Age-related osteoporosis without current pathological fracture: Secondary | ICD-10-CM | POA: Diagnosis not present

## 2023-11-24 DIAGNOSIS — Z87891 Personal history of nicotine dependence: Secondary | ICD-10-CM

## 2023-11-24 LAB — CBC WITH DIFFERENTIAL/PLATELET
Abs Immature Granulocytes: 0.03 10*3/uL (ref 0.00–0.07)
Basophils Absolute: 0.1 10*3/uL (ref 0.0–0.1)
Basophils Relative: 1 %
Eosinophils Absolute: 0.7 10*3/uL — ABNORMAL HIGH (ref 0.0–0.5)
Eosinophils Relative: 7 %
HCT: 36.3 % (ref 36.0–46.0)
Hemoglobin: 11.1 g/dL — ABNORMAL LOW (ref 12.0–15.0)
Immature Granulocytes: 0 %
Lymphocytes Relative: 25 %
Lymphs Abs: 2.5 10*3/uL (ref 0.7–4.0)
MCH: 26.7 pg (ref 26.0–34.0)
MCHC: 30.6 g/dL (ref 30.0–36.0)
MCV: 87.3 fL (ref 80.0–100.0)
Monocytes Absolute: 1 10*3/uL (ref 0.1–1.0)
Monocytes Relative: 10 %
Neutro Abs: 5.7 10*3/uL (ref 1.7–7.7)
Neutrophils Relative %: 57 %
Platelets: 355 10*3/uL (ref 150–400)
RBC: 4.16 MIL/uL (ref 3.87–5.11)
RDW: 14.6 % (ref 11.5–15.5)
WBC: 9.9 10*3/uL (ref 4.0–10.5)
nRBC: 0 % (ref 0.0–0.2)

## 2023-11-24 LAB — BRAIN NATRIURETIC PEPTIDE: B Natriuretic Peptide: 41 pg/mL (ref 0.0–100.0)

## 2023-11-24 LAB — BASIC METABOLIC PANEL WITH GFR
Anion gap: 9 (ref 5–15)
BUN: 14 mg/dL (ref 8–23)
CO2: 26 mmol/L (ref 22–32)
Calcium: 8.9 mg/dL (ref 8.9–10.3)
Chloride: 101 mmol/L (ref 98–111)
Creatinine, Ser: 0.84 mg/dL (ref 0.44–1.00)
GFR, Estimated: >60 mL/min (ref >60)
Glucose, Bld: 101 mg/dL — ABNORMAL HIGH (ref 70–99)
Potassium: 3.3 mmol/L — ABNORMAL LOW (ref 3.5–5.1)
Sodium: 136 mmol/L (ref 135–145)

## 2023-11-24 LAB — TROPONIN I (HIGH SENSITIVITY): Troponin I (High Sensitivity): 6 ng/L (ref <18)

## 2023-11-24 LAB — LACTIC ACID, PLASMA: Lactic Acid, Venous: 1.5 mmol/L (ref 0.5–1.9)

## 2023-11-24 MED ORDER — METHYLPREDNISOLONE SODIUM SUCC 125 MG IJ SOLR
125.0000 mg | Freq: Once | INTRAMUSCULAR | Status: AC
  Administered 2023-11-24: 125 mg via INTRAVENOUS
  Filled 2023-11-24: qty 2

## 2023-11-24 MED ORDER — IPRATROPIUM-ALBUTEROL 0.5-2.5 (3) MG/3ML IN SOLN
3.0000 mL | Freq: Once | RESPIRATORY_TRACT | Status: AC
  Administered 2023-11-24: 3 mL via RESPIRATORY_TRACT
  Filled 2023-11-24: qty 3

## 2023-11-24 MED ORDER — ALBUTEROL SULFATE HFA 108 (90 BASE) MCG/ACT IN AERS: 2.0000 | INHALATION_SPRAY | RESPIRATORY_TRACT | Status: DC | PRN

## 2023-11-24 MED ORDER — ALBUTEROL SULFATE (2.5 MG/3ML) 0.083% IN NEBU
10.0000 mg | INHALATION_SOLUTION | Freq: Once | RESPIRATORY_TRACT | Status: AC
  Administered 2023-11-25: 10 mg via RESPIRATORY_TRACT
  Filled 2023-11-24: qty 12

## 2023-11-24 NOTE — ED Provider Notes (Signed)
 [place] EMERGENCY DEPARTMENT AT [facility] Provider Note   CSN: [id number] Arrival date & time: 11/24/23  2137     History {Add pertinent medical, surgical, social history, OB history to HPI:1} Chief Complaint  Patient presents with   Shortness of Breath    Robyn Ashley is a 73 y.o. female.  Pt is a 73 yo female with pmhx significant for gerd, htn, mvp, sleep apnea, hld, hypothyroidism, former smoker and depression.  Pt has been sob for the past few days.  She just finished a course of abx and prednisone for pna.  Pt said she feels ok when she stops, but sob starts up again.  She has not been dx'd with COPD and has a rescue inhaler (albuterol ), but no an inhaled steroid.  Pt did see cardiology last week for DOE and further studies have been ordered.         Home Medications Prior to Admission medications   Medication Sig Start Date End Date Taking? Authorizing Provider  acetaminophen  (TYLENOL ) 500 MG tablet Take 500 mg by mouth every 8 (eight) hours as needed.    [provider]  albuterol  (PROVENTIL ) 2 MG tablet Take 2 mg by mouth 3 (three) times daily. 11/10/23   [provider]  albuterol  (VENTOLIN  HFA) 108 (90 Base) MCG/ACT inhaler Inhale 2 puffs into the lungs every 4 (four) hours as needed. 10/12/23   [provider]  atorvastatin  (LIPITOR) 20 MG tablet Take 20 mg by mouth at bedtime.     [provider]  calcium  carbonate (OSCAL) 1500 (600 Ca) MG TABS tablet Take 600 mg by mouth with breakfast.    [provider]  cholecalciferol (VITAMIN D3) 25 MCG (1000 UT) tablet Take 1,000 Units by mouth daily.    [provider]  docusate sodium  (COLACE) 100 MG capsule Take 1 capsule (100 mg total) by mouth 2 (two) times daily. 05/11/20   Swayze, Ava, DO  FLUoxetine  (PROZAC ) 10 MG capsule Take 10 mg by mouth daily.  04/01/18   [provider]  levothyroxine  (SYNTHROID , LEVOTHROID) 50 MCG tablet Take 50 mcg by  mouth daily before breakfast.  04/26/18   [provider]  magnesium  oxide (MAG-OX) 400 MG tablet Take by mouth.    [provider]  metoprolol  succinate (TOPROL -XL) 50 MG 24 hr tablet Take 50 mg by mouth at bedtime.  04/03/18   [provider]  mirtazapine (REMERON) 30 MG tablet Take 30 mg by mouth daily.    [provider]  NON FORMULARY CPAP at night    [provider]  Nutritional Supplements (FEEDING SUPPLEMENT, OSMOLITE 1.2 CAL,) LIQD Place 237 mLs into feeding tube 5 (five) times daily. 05/11/20   Swayze, Ava, DO  omeprazole (PRILOSEC) 20 MG capsule Take by mouth. 07/18/22   [provider]  senna-docusate (SENOKOT-S) 8.6-50 MG tablet Take 1 tablet by mouth 2 (two) times daily. 09/20/21   [provider]  valACYclovir (VALTREX) 1000 MG tablet Take 1,000 mg by mouth 2 (two) times daily as needed (for outbreak).     [provider]      Allergies    Tetracyclines & related, Aspirin, Norvasc [amlodipine besylate], Nsaids, and Dexilant  [dexlansoprazole ]    Review of Systems   Review of Systems  Respiratory:  Positive for cough, shortness of breath and wheezing.   All other systems reviewed and are negative.   Physical Exam Updated Vital Signs BP (!) 158/79   Pulse 75  Temp 97.8 F (36.6 C) (Oral)   Resp 14   Ht 5\' 5"  (1.651 m)   Wt 75 kg   SpO2 (!) 88%   BMI 27.51 kg/m  Physical Exam Vitals and nursing note reviewed.  Constitutional:      Appearance: She is well-developed.  HENT:     Head: Normocephalic and atraumatic.     Mouth/Throat:     Mouth: Mucous membranes are moist.     Pharynx: Oropharynx is clear.  Eyes:     Extraocular Movements: Extraocular movements intact.     Pupils: Pupils are equal, round, and reactive to light.  Cardiovascular:     Rate and Rhythm: Normal rate and regular rhythm.  Pulmonary:     Effort: Tachypnea present.     Breath sounds: Wheezing present.  Abdominal:      General: Bowel sounds are normal.     Palpations: Abdomen is soft.  Musculoskeletal:        General: Normal range of motion.     Cervical back: Normal range of motion and neck supple.  Skin:    General: Skin is warm.     Capillary Refill: Capillary refill takes less than 2 seconds.  Neurological:     General: No focal deficit present.     Mental Status: She is alert and oriented to person, place, and time.  Psychiatric:        Mood and Affect: Mood normal.        Behavior: Behavior normal.     ED Results / Procedures / Treatments   Labs (all labs ordered are listed, but only abnormal results are displayed) Labs Reviewed  BASIC METABOLIC PANEL WITH GFR - Abnormal; Notable for the following components:      Result Value   Potassium 3.3 (*)    Glucose, Bld 101 (*)    All other components within normal limits  CBC WITH DIFFERENTIAL/PLATELET - Abnormal; Notable for the following components:   Hemoglobin 11.1 (*)    Eosinophils Absolute 0.7 (*)    All other components within normal limits  CULTURE, BLOOD (ROUTINE X 2)  CULTURE, BLOOD (ROUTINE X 2)  RESP PANEL BY RT-PCR (RSV, FLU A&B, COVID)  RVPGX2  BRAIN NATRIURETIC PEPTIDE  LACTIC ACID, PLASMA  LACTIC ACID, PLASMA  TROPONIN I (HIGH SENSITIVITY)    EKG EKG Interpretation Date/Time:  Sunday November 24 2023 21:45:16 EDT Ventricular Rate:  83 PR Interval:  169 QRS Duration:  87 QT Interval:  353 QTC Calculation: 415 R Axis:   56  Text Interpretation: Sinus rhythm Nonspecific T abnrm, anterolateral leads No significant change since last tracing Confirmed by Sueellen Emery [phone]) on 11/24/2023 11:19:14 PM  Radiology DG Chest Portable 1 View Result Date: 11/24/2023 CLINICAL DATA:  Short of breath EXAM: PORTABLE CHEST 1 VIEW COMPARISON:  11/11/2023 FINDINGS: 2 frontal views of the chest demonstrate a stable cardiac silhouette. No acute airspace disease, effusion, or pneumothorax. No acute bony abnormalities. IMPRESSION: 1.  Stable chest, no acute process. Electronically Signed   By: Bobbye Burrow M.D.   On: 11/24/2023 22:04    Procedures Procedures  {Document cardiac monitor, telemetry assessment procedure when appropriate:1}  Medications Ordered in ED Medications  albuterol  (PROVENTIL ) (2.5 MG/3ML) 0.083% nebulizer solution 10 mg (has no administration in time range)  ipratropium-albuterol  (DUONEB) 0.5-2.5 (3) MG/3ML nebulizer solution 3 mL (3 mLs Nebulization Given 11/24/23 2246)  methylPREDNISolone  sodium succinate (SOLU-MEDROL ) 125 mg/2 mL injection 125 mg (125 mg Intravenous Given 11/24/23 2246)  ED Course/ Medical Decision Making/ A&P   {   Click here for ABCD2, HEART and other calculatorsREFRESH Note before signing :1}                              Medical Decision Making Amount and/or Complexity of Data Reviewed Labs: ordered. Radiology: ordered.  Risk Prescription drug management.   This patient presents to the ED for concern of sob, this involves an extensive number of treatment options, and is a complaint that carries with it a high risk of complications and morbidity.  The differential diagnosis includes pna, covid/flu/rsv, bronchitis, rad   Co morbidities that complicate the patient evaluation  gerd, htn, mvp, sleep apnea, hld, hypothyroidism, former smoker and depression   Additional history obtained:  Additional history obtained from epic chart review External records from outside source obtained and reviewed including husband   Lab Tests:  I Ordered, and personally interpreted labs.  The pertinent results include:  cbc nl, bmp nl, trop nl, lactic nl, bnp nl   Imaging Studies ordered:  I ordered imaging studies including cxr  I independently visualized and interpreted imaging which showed   1. Stable chest, no acute process.   I agree with the radiologist interpretation   Cardiac Monitoring:  The patient was maintained on a cardiac monitor.  I personally viewed  and interpreted the cardiac monitored which showed an underlying rhythm of: nsr   Medicines ordered and prescription drug management:  I ordered medication including solumedrol/nebs  for sx  Reevaluation of the patient after these medicines showed that the patient improved I have reviewed the patients home medicines and have made adjustments as needed   Test Considered:  ct   Critical Interventions:  oxygen   Consultations Obtained:  I requested consultation with the ***,  and discussed lab and imaging findings as well as pertinent plan - they recommend: ***   Problem List / ED Course:  RAD:  pt's wheezing has improved with duoneb and solumedrol, but she's still wheezing.  O2 sats were staying 87-88% on RA, so she was put on 2L and sats up to the mid-90s.  Additional nebs ordered.   Reevaluation:  After the interventions noted above, I reevaluated the patient and found that they have :improved   Social Determinants of Health:  Lives at home   Dispostion:  After consideration of the diagnostic results and the patients response to treatment, I feel that the patent would benefit from admission.  CRITICAL CARE Performed by: Sueellen Emery   Total critical care time: *** minutes  Critical care time was exclusive of separately billable procedures and treating other patients.  Critical care was necessary to treat or prevent imminent or life-threatening deterioration.  Critical care was time spent personally by me on the following activities: development of treatment plan with patient and/or surrogate as well as nursing, discussions with consultants, evaluation of patient's response to treatment, examination of patient, obtaining history from patient or surrogate, ordering and performing treatments and interventions, ordering and review of laboratory studies, ordering and review of radiographic studies, pulse oximetry and re-evaluation of patient's condition.      {Document critical care time when appropriate:1} {Document review of labs and clinical decision tools ie heart score, Chads2Vasc2 etc:1}  {Document your independent review of radiology images, and any outside records:1} {Document your discussion with family members, caretakers, and with consultants:1} {Document social determinants of health affecting pt's care:1} {Document your  decision making why or why not admission, treatments were needed:1} Final Clinical Impression(s) / ED Diagnoses Final diagnoses:  None    Rx / DC Orders ED Discharge Orders     None

## 2023-11-24 NOTE — ED Notes (Signed)
 Pt placed on 2L [place] d/t oxygen 88%.

## 2023-11-24 NOTE — ED Triage Notes (Signed)
 Pt with c/o increased SOB throughout day. Has used inhaler without relief. States recently finished antibiotic and steroid treatment for Pneumonia.

## 2023-11-25 DIAGNOSIS — R4189 Other symptoms and signs involving cognitive functions and awareness: Secondary | ICD-10-CM | POA: Diagnosis present

## 2023-11-25 DIAGNOSIS — Z8249 Family history of ischemic heart disease and other diseases of the circulatory system: Secondary | ICD-10-CM | POA: Diagnosis not present

## 2023-11-25 DIAGNOSIS — Z886 Allergy status to analgesic agent status: Secondary | ICD-10-CM | POA: Diagnosis not present

## 2023-11-25 DIAGNOSIS — Z820 Family history of epilepsy and other diseases of the nervous system: Secondary | ICD-10-CM | POA: Diagnosis not present

## 2023-11-25 DIAGNOSIS — Z888 Allergy status to other drugs, medicaments and biological substances status: Secondary | ICD-10-CM | POA: Diagnosis not present

## 2023-11-25 DIAGNOSIS — R1310 Dysphagia, unspecified: Secondary | ICD-10-CM | POA: Diagnosis present

## 2023-11-25 DIAGNOSIS — F32A Depression, unspecified: Secondary | ICD-10-CM | POA: Diagnosis present

## 2023-11-25 DIAGNOSIS — I341 Nonrheumatic mitral (valve) prolapse: Secondary | ICD-10-CM | POA: Diagnosis present

## 2023-11-25 DIAGNOSIS — M810 Age-related osteoporosis without current pathological fracture: Secondary | ICD-10-CM | POA: Diagnosis present

## 2023-11-25 DIAGNOSIS — J40 Bronchitis, not specified as acute or chronic: Secondary | ICD-10-CM | POA: Diagnosis present

## 2023-11-25 DIAGNOSIS — E785 Hyperlipidemia, unspecified: Secondary | ICD-10-CM | POA: Diagnosis present

## 2023-11-25 DIAGNOSIS — J9601 Acute respiratory failure with hypoxia: Secondary | ICD-10-CM

## 2023-11-25 DIAGNOSIS — E039 Hypothyroidism, unspecified: Secondary | ICD-10-CM | POA: Diagnosis present

## 2023-11-25 DIAGNOSIS — E876 Hypokalemia: Secondary | ICD-10-CM | POA: Diagnosis present

## 2023-11-25 DIAGNOSIS — J4489 Other specified chronic obstructive pulmonary disease: Secondary | ICD-10-CM | POA: Diagnosis not present

## 2023-11-25 DIAGNOSIS — J441 Chronic obstructive pulmonary disease with (acute) exacerbation: Secondary | ICD-10-CM | POA: Diagnosis present

## 2023-11-25 DIAGNOSIS — I10 Essential (primary) hypertension: Secondary | ICD-10-CM | POA: Diagnosis present

## 2023-11-25 DIAGNOSIS — Z800 Family history of malignant neoplasm of digestive organs: Secondary | ICD-10-CM | POA: Diagnosis not present

## 2023-11-25 DIAGNOSIS — Z881 Allergy status to other antibiotic agents status: Secondary | ICD-10-CM | POA: Diagnosis not present

## 2023-11-25 DIAGNOSIS — Z79899 Other long term (current) drug therapy: Secondary | ICD-10-CM | POA: Diagnosis not present

## 2023-11-25 DIAGNOSIS — Z79890 Hormone replacement therapy (postmenopausal): Secondary | ICD-10-CM | POA: Diagnosis not present

## 2023-11-25 DIAGNOSIS — Z87891 Personal history of nicotine dependence: Secondary | ICD-10-CM | POA: Diagnosis not present

## 2023-11-25 DIAGNOSIS — Z1152 Encounter for screening for COVID-19: Secondary | ICD-10-CM | POA: Diagnosis not present

## 2023-11-25 DIAGNOSIS — G4733 Obstructive sleep apnea (adult) (pediatric): Secondary | ICD-10-CM | POA: Diagnosis present

## 2023-11-25 DIAGNOSIS — K219 Gastro-esophageal reflux disease without esophagitis: Secondary | ICD-10-CM | POA: Diagnosis present

## 2023-11-25 DIAGNOSIS — J45901 Unspecified asthma with (acute) exacerbation: Secondary | ICD-10-CM | POA: Diagnosis present

## 2023-11-25 LAB — CBC
HCT: 35.1 % — ABNORMAL LOW (ref 36.0–46.0)
Hemoglobin: 10.6 g/dL — ABNORMAL LOW (ref 12.0–15.0)
MCH: 26.3 pg (ref 26.0–34.0)
MCHC: 30.2 g/dL (ref 30.0–36.0)
MCV: 87.1 fL (ref 80.0–100.0)
Platelets: 318 10*3/uL (ref 150–400)
RBC: 4.03 MIL/uL (ref 3.87–5.11)
RDW: 14.5 % (ref 11.5–15.5)
WBC: 10.8 10*3/uL — ABNORMAL HIGH (ref 4.0–10.5)
nRBC: 0 % (ref 0.0–0.2)

## 2023-11-25 LAB — RESP PANEL BY RT-PCR (RSV, FLU A&B, COVID)  RVPGX2
Influenza A by PCR: NEGATIVE
Influenza B by PCR: NEGATIVE
Resp Syncytial Virus by PCR: NEGATIVE
SARS Coronavirus 2 by RT PCR: NEGATIVE

## 2023-11-25 LAB — RESPIRATORY PANEL BY PCR

## 2023-11-25 LAB — BASIC METABOLIC PANEL WITH GFR
Anion gap: 15 (ref 5–15)
BUN: 16 mg/dL (ref 8–23)
CO2: 23 mmol/L (ref 22–32)
Calcium: 8.9 mg/dL (ref 8.9–10.3)
Chloride: 102 mmol/L (ref 98–111)
Creatinine, Ser: 0.83 mg/dL (ref 0.44–1.00)
GFR, Estimated: >60 mL/min (ref >60)
Glucose, Bld: 169 mg/dL — ABNORMAL HIGH (ref 70–99)
Potassium: 3.3 mmol/L — ABNORMAL LOW (ref 3.5–5.1)
Sodium: 140 mmol/L (ref 135–145)

## 2023-11-25 LAB — LACTIC ACID, PLASMA: Lactic Acid, Venous: 1.2 mmol/L (ref 0.5–1.9)

## 2023-11-25 LAB — MAGNESIUM: Magnesium: 1.7 mg/dL (ref 1.7–2.4)

## 2023-11-25 LAB — PHOSPHORUS: Phosphorus: 3.2 mg/dL (ref 2.5–4.6)

## 2023-11-25 LAB — TROPONIN I (HIGH SENSITIVITY): Troponin I (High Sensitivity): 5 ng/L (ref <18)

## 2023-11-25 LAB — D-DIMER, QUANTITATIVE: D-Dimer, Quant: 0.54 ug{FEU}/mL — ABNORMAL HIGH (ref 0.00–0.50)

## 2023-11-25 LAB — HIV ANTIBODY (ROUTINE TESTING W REFLEX): HIV Screen 4th Generation wRfx: NONREACTIVE

## 2023-11-25 MED ORDER — FLUOXETINE HCL 20 MG PO CAPS
20.0000 mg | ORAL_CAPSULE | Freq: Every day | ORAL | Status: DC
  Administered 2023-11-25 – 2023-11-26 (×2): 20 mg via ORAL
  Filled 2023-11-25 (×2): qty 1

## 2023-11-25 MED ORDER — IPRATROPIUM-ALBUTEROL 0.5-2.5 (3) MG/3ML IN SOLN
3.0000 mL | Freq: Four times a day (QID) | RESPIRATORY_TRACT | Status: DC
  Administered 2023-11-25 – 2023-11-26 (×5): 3 mL via RESPIRATORY_TRACT
  Filled 2023-11-25 (×5): qty 3

## 2023-11-25 MED ORDER — METOPROLOL SUCCINATE ER 25 MG PO TB24
50.0000 mg | ORAL_TABLET | Freq: Every day | ORAL | Status: DC
  Administered 2023-11-25 (×2): 50 mg via ORAL
  Filled 2023-11-25: qty 2

## 2023-11-25 MED ORDER — PREDNISONE 20 MG PO TABS
40.0000 mg | ORAL_TABLET | Freq: Every day | ORAL | Status: DC
Start: 2023-11-26 — End: 2023-11-25

## 2023-11-25 MED ORDER — PANTOPRAZOLE SODIUM 40 MG PO TBEC
40.0000 mg | DELAYED_RELEASE_TABLET | Freq: Every day | ORAL | Status: DC
  Administered 2023-11-25 – 2023-11-26 (×2): 40 mg via ORAL
  Filled 2023-11-25 (×2): qty 1

## 2023-11-25 MED ORDER — SODIUM CHLORIDE 0.9% FLUSH
3.0000 mL | Freq: Two times a day (BID) | INTRAVENOUS | Status: DC
  Administered 2023-11-25 – 2023-11-26 (×4): 3 mL via INTRAVENOUS

## 2023-11-25 MED ORDER — LOSARTAN POTASSIUM 50 MG PO TABS
50.0000 mg | ORAL_TABLET | Freq: Every day | ORAL | Status: DC
  Administered 2023-11-25 – 2023-11-26 (×2): 50 mg via ORAL
  Filled 2023-11-25 (×2): qty 1

## 2023-11-25 MED ORDER — LEVOTHYROXINE SODIUM 50 MCG PO TABS
50.0000 ug | ORAL_TABLET | Freq: Every day | ORAL | Status: DC
  Administered 2023-11-25 – 2023-11-26 (×2): 50 ug via ORAL
  Filled 2023-11-25 (×2): qty 1

## 2023-11-25 MED ORDER — PREDNISONE 20 MG PO TABS
40.0000 mg | ORAL_TABLET | Freq: Every day | ORAL | Status: DC
Start: 2023-11-25 — End: 2023-11-29
  Administered 2023-11-25 – 2023-11-26 (×2): 40 mg via ORAL
  Filled 2023-11-25 (×2): qty 2

## 2023-11-25 MED ORDER — ENALAPRIL MALEATE 10 MG PO TABS: 40.0000 mg | ORAL_TABLET | Freq: Every day | ORAL | Status: DC

## 2023-11-25 MED ORDER — SODIUM CHLORIDE 0.9 % IV SOLN
1.0000 g | INTRAVENOUS | Status: DC
  Administered 2023-11-25 – 2023-11-26 (×2): 1 g via INTRAVENOUS
  Filled 2023-11-25 (×2): qty 10

## 2023-11-25 MED ORDER — ENOXAPARIN SODIUM 40 MG/0.4ML IJ SOSY
40.0000 mg | PREFILLED_SYRINGE | INTRAMUSCULAR | Status: DC
  Administered 2023-11-25 – 2023-11-26 (×2): 40 mg via SUBCUTANEOUS
  Filled 2023-11-25 (×2): qty 0.4

## 2023-11-25 MED ORDER — POTASSIUM CHLORIDE CRYS ER 20 MEQ PO TBCR
40.0000 meq | EXTENDED_RELEASE_TABLET | Freq: Once | ORAL | Status: AC
  Administered 2023-11-25: 40 meq via ORAL
  Filled 2023-11-25: qty 2

## 2023-11-25 MED ORDER — ALBUTEROL SULFATE (2.5 MG/3ML) 0.083% IN NEBU: 2.5000 mg | INHALATION_SOLUTION | RESPIRATORY_TRACT | Status: DC | PRN

## 2023-11-25 MED ORDER — MIRTAZAPINE 30 MG PO TABS
30.0000 mg | ORAL_TABLET | Freq: Every day | ORAL | Status: DC
  Administered 2023-11-25 (×2): 30 mg via ORAL
  Filled 2023-11-25: qty 1

## 2023-11-25 MED ORDER — ATORVASTATIN CALCIUM 20 MG PO TABS
20.0000 mg | ORAL_TABLET | Freq: Every day | ORAL | Status: DC
  Administered 2023-11-25 (×2): 20 mg via ORAL
  Filled 2023-11-25: qty 1

## 2023-11-25 NOTE — Progress Notes (Signed)
 Mobility Specialist Progress Note:    11/25/23 1045  Mobility  Activity Ambulated with assistance in room;Ambulated with assistance to bathroom;Stood at bedside  Level of Assistance Modified independent, requires aide device or extra time  Assistive Device None  Distance Ambulated (ft) 45 ft  Range of Motion/Exercises Active;All extremities  Activity Response Tolerated well  Mobility Referral Yes  Mobility visit 1 Mobility  Mobility Specialist Start Time (ACUTE ONLY) 1035  Mobility Specialist Stop Time (ACUTE ONLY) 1050  Mobility Specialist Time Calculation (min) (ACUTE ONLY) 15 min   Pt received requesting assistance to bathroom, granddaughter at bedside. ModI to stand and ambulate with no AD. Tolerated well, asx throughout. Returned pt supine, all needs met.  Glinda Lapping Mobility Specialist Please contact via Special educational needs teacher or  Rehab office at [phone]

## 2023-11-25 NOTE — Progress Notes (Signed)
 [facility] PROGRESS NOTE  Robyn Ashley (DOB: 1951-02-10) ZHY:[id number] PCP: Kathyleen Parkins, MD  Brief Narrative: Robyn Ashley is a 73 y.o. 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 She was found to be hypoxemic with negative CXR and wheezing, admitted for AECOPD/asthma overlap this morning.   Subjective: Breathing much better ever since they put oxygen on, did continuous albuterol  and gave steroids. Daughter and 2 granddaughters at bedside this morning. She's a retired [employer] fund[employer].  Objective: BP 106/63   Pulse 94   Temp 97.6 F (36.4 C)   Resp 18   Ht 5\' 5"  (1.651 m)   Wt 75 kg   SpO2 96%   BMI 27.51 kg/m   Gen: No distress Pulm: Clear, good aeration, nonlabored on supplemental oxygen  CV: RRR, no MRG or pitting edema   Assessment & Plan: Acute hypoxic respiratory failure: Primarily due to AECOPD/asthma. PFTs in 2020: FEV1/FVC 71% including FEV1 reversibility of 17% post bronchodilator. Recent CT chest does appear to have upper lobe predominant emphysematous changes, which goes along with her prior history of 25-pack-year smoking. Note d-dimer is below age-adjusted cut-off at 0.54.  - Continue steroids, scheduled and inhaled bronchodilators - Plan to DC with ICS/LABA, DME nebulizer machine and albuterol  neb and referral to pulmonary. She needs repeat CPAP setting adjustment, repeat formal PFTs after convalescence.  - RVP, sputum culture pending. Started on ceftriaxone.  - Use incentive spirometry and check ambulatory pulse oximetry.   Chronic DOE:  - Continue work up as planned with coronary CTA 5/8 and echocardiogram 5/19, follow up with Dr. Mallipeddi thereafter.  - Given her chronic cough, will  change enalapril  to losartan. Ok to continue cardioselective beta blocker at this point.  Otherwise, please see H&P from this morning per Dr. Segars for full plan information.  Wynetta Heckle, MD [facility] [email or web address] 11/25/2023, 12:06 PM

## 2023-11-25 NOTE — H&P (Addendum)
 History and Physical    Robyn Ashley ZOX:[id number] DOB: Aug 08, 1950 DOA: 11/24/2023  PCP: Kathyleen Parkins, MD   Patient coming from: Home   Chief Complaint:  Chief Complaint  Patient presents with   Shortness of Breath    HPI:  Robyn Ashley is a 72 y.o. female with hx of likely asthma/ COPD overlap, former smoker ~25 pack years, cognitive impairment, hypertension, hyperlipidemia, hypothyroidism, SVT, MVP, OSA, mood disorder, GERD with history of fundoplication, dysphagia, who presents with persistent shortness of breath.  Reports that she has had chronic dyspnea on exertion which has developed since 11/' 24 with limiting dyspnea on minimal exertion such as after showering and getting dressed.  Over the past month and a half she has had a nonproductive cough.  Was treated for community-acquired pneumonia / ? Asthma/copd exacerbation 2 weeks ago; finished Methylpred Dosepak on 4/19, 10-day course of doxycycline on 4/23.   Since being off antibiotics/steroids her dyspnea has worsened again especially over the past day and now short of breath even at rest.  No fever, chills, change in her subacute to chronic cough.  No exertional or other chest pain.  Some rhinorrhea, no other URI symptoms.  Has had significant wheezing today.  No sick contacts.  Only uses an albuterol  inhaler at home does not have a long-acting inhaler.  Review of Systems:  ROS complete and negative except as marked above   Allergies  Allergen Reactions   Tetracyclines & Related Other (See Comments)    Pt developed gastritis while taking tetracycline   Aspirin     bruises   Norvasc [Amlodipine Besylate]     Made BP drop low   Nsaids     bruising   Dexilant  [Dexlansoprazole ] Diarrhea    Prior to Admission medications   Medication Sig Start Date End Date Taking? Authorizing Provider  acetaminophen  (TYLENOL ) 500 MG tablet Take 500 mg by mouth every 8 (eight) hours as needed.    [provider]   albuterol  (PROVENTIL ) 2 MG tablet Take 2 mg by mouth 3 (three) times daily. 11/10/23   [provider]  albuterol  (VENTOLIN  HFA) 108 (90 Base) MCG/ACT inhaler Inhale 2 puffs into the lungs every 4 (four) hours as needed. 10/12/23   [provider]  atorvastatin  (LIPITOR) 20 MG tablet Take 20 mg by mouth at bedtime.     [provider]  calcium  carbonate (OSCAL) 1500 (600 Ca) MG TABS tablet Take 600 mg by mouth with breakfast.    [provider]  cholecalciferol (VITAMIN D3) 25 MCG (1000 UT) tablet Take 1,000 Units by mouth daily.    [provider]  docusate sodium  (COLACE) 100 MG capsule Take 1 capsule (100 mg total) by mouth 2 (two) times daily. 05/11/20   Swayze, Ava, DO  FLUoxetine  (PROZAC ) 10 MG capsule Take 10 mg by mouth daily.  04/01/18   [provider]  levothyroxine  (SYNTHROID , LEVOTHROID) 50 MCG tablet Take 50 mcg by mouth daily before breakfast.  04/26/18   [provider]  magnesium  oxide (MAG-OX) 400 MG tablet Take by mouth.    [provider]  metoprolol  succinate (TOPROL -XL) 50 MG 24 hr tablet Take 50 mg by mouth at bedtime.  04/03/18   [provider]  mirtazapine (REMERON) 30 MG tablet Take 30 mg by mouth daily.    [provider]  NON FORMULARY CPAP at night    [provider]  Nutritional Supplements (FEEDING SUPPLEMENT, OSMOLITE 1.2 CAL,) LIQD Place 237 mLs  into feeding tube 5 (five) times daily. 05/11/20   Swayze, Ava, DO  omeprazole (PRILOSEC) 20 MG capsule Take by mouth. 07/18/22   [provider]  senna-docusate (SENOKOT-S) 8.6-50 MG tablet Take 1 tablet by mouth 2 (two) times daily. 09/20/21   [provider]  valACYclovir (VALTREX) 1000 MG tablet Take 1,000 mg by mouth 2 (two) times daily as needed (for outbreak).     [provider]    Past Medical History:  Diagnosis Date   Allergy    Anemia    Anginal pain (HCC) 2015   Depression     Dyslipidemia 05/10/2020   Dysrhythmia 2015   SVT   Essential hypertension 05/10/2020   GERD (gastroesophageal reflux disease)    Hiatal hernia    History of degenerative disc disease    History of nuclear stress test 07/27/2013   in care everywhere per cardiology note by dr Santo Cullen:  test normal   Hyperlipidemia    Hypertension    Hypothyroidism    Hypothyroidism (acquired) 05/10/2020   Intermittent palpitations    MVP (mitral valve prolapse)    per pt dx 1980s,  last echo done 2016 by dr Santo Cullen [facility] cardiology in [place]) and last cardiology visit 11-02-2014 in epic;   01-27-2019 per pt episodic palpitations and takes toprol , currently followed by pcp   Narcolepsy    OSA (obstructive sleep apnea)    Not on CPAP   Osteoporosis    osteopenia   TMJ arthralgia     Past Surgical History:  Procedure Laterality Date   ABDOMINAL HYSTERECTOMY     BIOPSY  06/24/2018   Procedure: BIOPSY;  Surgeon: Suzette Espy, MD;  [facility];  Service: Endoscopy;;  (gastric) (colon)   BREAST CYST ASPIRATION     COLONOSCOPY N/A 06/24/2018   Procedure: COLONOSCOPY;  Surgeon: Suzette Espy, MD;  [facility];  Service: Endoscopy;  Laterality: N/A;  1:45pm   ESOPHAGEAL DILATION  09/02/2019   Procedure: ESOPHAGEAL DILATION;  Surgeon: Sergio Dandy, MD;  [facility]: [facility];  Service: Endoscopy;;   ESOPHAGEAL MANOMETRY N/A 09/02/2019   Procedure: ESOPHAGEAL MANOMETRY (EM);  Surgeon: Sergio Dandy, MD;  [facility]: [facility];  Service: Endoscopy;  Laterality: N/A;   ESOPHAGEAL MANOMETRY N/A 02/06/2021   Procedure: ESOPHAGEAL MANOMETRY (EM);  Surgeon: Sergio Dandy, MD;  [facility];  Service: Endoscopy;  Laterality: N/A;   ESOPHAGOGASTRODUODENOSCOPY N/A 06/24/2018   Procedure: ESOPHAGOGASTRODUODENOSCOPY (EGD);  Surgeon: Suzette Espy, MD;  [facility];  Service: Endoscopy;  Laterality: N/A;   ESOPHAGOGASTRODUODENOSCOPY N/A 07/14/2019    Procedure: ESOPHAGOGASTRODUODENOSCOPY (EGD);  Surgeon: Suzette Espy, MD;  [facility];  Service: Endoscopy;  Laterality: N/A;  3:00pm   ESOPHAGOGASTRODUODENOSCOPY (EGD) WITH PROPOFOL  N/A 09/02/2019   Procedure: ESOPHAGOGASTRODUODENOSCOPY (EGD) WITH PROPOFOL ;  Surgeon: Sergio Dandy, MD;  [facility];  Service: Endoscopy;  Laterality: N/A;   mano probe placement   EYE SURGERY Bilateral    rk   IR GASTROSTOMY TUBE MOD SED  05/10/2020   IR RADIOLOGIST EVAL & MGMT  05/26/2020   MALONEY DILATION N/A 06/24/2018   Procedure: Londa Rival DILATION;  Surgeon: Suzette Espy, MD;  [facility];  Service: Endoscopy;  Laterality: N/A;   MALONEY DILATION N/A 07/14/2019   Procedure: Londa Rival DILATION;  Surgeon: Suzette Espy, MD;  [facility];  Service: Endoscopy;  Laterality: N/A;   NISSEN FUNDOPLICATION  09/2019   takedown  POLYPECTOMY  06/24/2018   Procedure: POLYPECTOMY;  Surgeon: Suzette Espy, MD;  [facility];  Service: Endoscopy;;  colon    RF ablation of cervical nerves     ROTATOR CUFF REPAIR Right 11-21-2006  dr supple @[facility]      reports that she quit smoking about 26 years ago. Her smoking use included cigarettes. She started smoking about 55 years ago. She has a 43.5 pack-year smoking history. She has never used smokeless tobacco. She reports that she does not currently use alcohol. She reports that she does not use drugs.  Family History  Problem Relation Age of Onset   Colon cancer Maternal Grandfather    Colon cancer Paternal Grandmother    Alzheimer's disease Mother    Heart disease Father    COPD Father    Breast cancer Maternal Grandmother    Liver cancer Brother    Colon polyps Neg Hx    Stomach cancer Neg Hx    Esophageal cancer Neg Hx    Rectal cancer Neg Hx      Physical Exam: Vitals:   11/25/23 0115 11/25/23 0120 11/25/23 0139 11/25/23 0225  BP:   (!) 138/59   Pulse:   93   Resp:   18   Temp:   97.7  F (36.5 C)   TempSrc:   Oral   SpO2: 90% 93% 97% 96%  Weight:      Height:        Gen: Awake, alert, NAD   CV: Regular, normal S1, S2, 1/6 SEM Resp: Normal WOB, on 2 L [place], prolonged expiratory phase with diffuse mild expiratory wheezing Abd: Flat, normoactive, nontender MSK: Symmetric, no edema  Skin: No rashes or lesions to exposed skin  Neuro: Alert and interactive  Psych: euthymic, appropriate    Data review:   Labs reviewed, notable for:   K3.3 BNP within normal limit High-sensitivity troponin negative WBC 9  Micro:  Results for orders placed or performed during the hospital encounter of 11/24/23  Resp panel by RT-PCR (RSV, Flu A&B, Covid) Anterior Nasal Swab     Status: None   Collection Time: 11/24/23 11:26 PM   Specimen: Anterior Nasal Swab  Result Value Ref Range Status   SARS Coronavirus 2 by RT PCR NEGATIVE NEGATIVE Final    Comment: (NOTE) SARS-CoV-2 target nucleic acids are NOT DETECTED.  The SARS-CoV-2 RNA is generally detectable in upper respiratory specimens during the acute phase of infection. The lowest concentration of SARS-CoV-2 viral copies this assay can detect is 138 copies/mL. A negative result does not preclude SARS-Cov-2 infection and should not be used as the sole basis for treatment or other patient management decisions. A negative result may occur with  improper specimen collection/handling, submission of specimen other than nasopharyngeal swab, presence of viral mutation(s) within the areas targeted by this assay, and inadequate number of viral copies(<138 copies/mL). A negative result must be combined with clinical observations, patient history, and epidemiological information. The expected result is Negative.  Fact Sheet for Patients:  BloggerCourse.com  Fact Sheet for Healthcare Providers:  SeriousBroker.it  This test is no t yet approved or cleared by the [place]  FDA and   has been authorized for detection and/or diagnosis of SARS-CoV-2 by FDA under an Emergency Use Authorization (EUA). This EUA will remain  in effect (meaning this test can be used) for the duration of the COVID-19 declaration under Section 564(b)(1) of the Act, 21 U.S.C.section 360bbb-3(b)(1), unless the authorization is terminated  or  revoked sooner.       Influenza A by PCR NEGATIVE NEGATIVE Final   Influenza B by PCR NEGATIVE NEGATIVE Final    Comment: (NOTE) The Xpert Xpress SARS-CoV-2/FLU/RSV plus assay is intended as an aid in the diagnosis of influenza from Nasopharyngeal swab specimens and should not be used as a sole basis for treatment. Nasal washings and aspirates are unacceptable for Xpert Xpress SARS-CoV-2/FLU/RSV testing.  Fact Sheet for Patients: BloggerCourse.com  Fact Sheet for Healthcare Providers: SeriousBroker.it  This test is not yet approved or cleared by the [place]  FDA and has been authorized for detection and/or diagnosis of SARS-CoV-2 by FDA under an Emergency Use Authorization (EUA). This EUA will remain in effect (meaning this test can be used) for the duration of the COVID-19 declaration under Section 564(b)(1) of the Act, 21 U.S.C. section 360bbb-3(b)(1), unless the authorization is terminated or revoked.     Resp Syncytial Virus by PCR NEGATIVE NEGATIVE Final    Comment: (NOTE) Fact Sheet for Patients: BloggerCourse.com  Fact Sheet for Healthcare Providers: SeriousBroker.it  This test is not yet approved or cleared by the [place]  FDA and has been authorized for detection and/or diagnosis of SARS-CoV-2 by FDA under an Emergency Use Authorization (EUA). This EUA will remain in effect (meaning this test can be used) for the duration of the COVID-19 declaration under Section 564(b)(1) of the Act, 21 U.S.C. section 360bbb-3(b)(1),  unless the authorization is terminated or revoked.  Performed at [facility], [address], [place], [place] [address]     Imaging reviewed:  DG Chest Portable 1 View Result Date: 11/24/2023 CLINICAL DATA:  Short of breath EXAM: PORTABLE CHEST 1 VIEW COMPARISON:  11/11/2023 FINDINGS: 2 frontal views of the chest demonstrate a stable cardiac silhouette. No acute airspace disease, effusion, or pneumothorax. No acute bony abnormalities. IMPRESSION: 1. Stable chest, no acute process. Electronically Signed   By: Bobbye Burrow M.D.   On: 11/24/2023 22:04   Personally reviewed historical CT chest from 2/'25; Rads w no comment on abnormal lung parenchyma. on my review has mosaic attenuation consistent with air trapping and findings of mild paraseptal emphysematous changes.      Reviewed prior PFT: 10/'20     ED Course:  Treated with methylprednisolone , nebulizer.    Assessment/Plan:  73 y.o. female with hx hx of likely asthma/ COPD overlap, former smoker ~25 pack years, cognitive impairment, hypertension, hyperlipidemia, hypothyroidism, SVT, MVP, OSA, mood disorder, GERD with history of fundoplication, dysphagia, who presents with persistent shortness of breath.  Admitted with asthma/COPD exacerbation, acute hypoxic respiratory failure.  Asthma/COPD exacerbation Acute hypoxic respiratory failure, 2L O2  Acute on chronic dyspnea on exertion Currently under evaluation for progressive dyspnea on exertion outpatient has seen cardiology and planning to have echo and coronary CT to evaluate for ischemic cause.  However old PFTs in '20 included above demonstrates an FEV1 over FVC of 71% including FEV1 reversibility of 17% post bronchodilator.  With the results consistent with asthma.  And recent imaging with CT chest 2/' 25 does appear to have upper lobe predominant emphysematous changes, which goes along with her prior history of 25-pack-year smoking.  Overall.  She is likely asthma/COPD overlap  and suspect that her PFT numbers have worsened over time.  On this admission had desaturation to 88% on RA and currently requiring 2 L.  Flu/COVID/RSV negative.  Chest x-ray with no infiltrates.  -Discussed her previous testing and likely diagnosis of asthma/COPD overlap.  -S/p methylprednisolone , continue prednisone 40  mg daily for 5-day total course -Start ceftriaxone 1 g IV every 24 hour -Check RVP, sputum culture -DuoNebs every 6 hours scheduled, albuterol  every 4 hours as needed, incentive spirometer, flutter valve, encourage out of bed to chair. -Home O2 evaluation prior to discharge -Would Rx for LABA/ICS, or LAMA/LABA/ICS on discharge  -Needs PCP follow-up and repeat PFT outpatient  Hypokalemia Repleted  Chronic medical problems: Cognitive impairment: Noted, not on medication.  Fully oriented on my interview Hypertension: Continue home enalapril  40 mg daily, metoprolol  50 mg daily; suspect chronic cough is in the setting of her underlying lung disease per above.  However if persistent nonproductive cough would consider ACE inhibitor related cough. Hyperlipidemia: Continue home atorvastatin  Hypothyroidism: Continue home levothyroxine  SVT, MVP: Continue home metoprolol  50 mg daily OSA: CPAP at night. Mood disorder: Continue home mirtazapine, fluoxetine   GERD with history of fundoplication, dysphagia: Noted.  Sub home PPI  Body mass index is 27.51 kg/m.    DVT prophylaxis:  Lovenox  Code Status:  Full Code Diet:  Diet Orders (From admission, onward)     Start     Ordered   11/25/23 0036  Diet regular Room service appropriate? Yes; Fluid consistency: Thin  Diet effective now       Question Answer Comment  Room service appropriate? Yes   Fluid consistency: Thin      11/25/23 0042           Family Communication:  None   Consults:  None   Admission status:   Observation, Telemetry bed  Severity of Illness: The appropriate patient status for this patient is  OBSERVATION. Observation status is judged to be reasonable and necessary in order to provide the required intensity of service to ensure the patient's safety. The patient's presenting symptoms, physical exam findings, and initial radiographic and laboratory data in the context of their medical condition is felt to place them at decreased risk for further clinical deterioration. Furthermore, it is anticipated that the patient will be medically stable for discharge from the hospital within 2 midnights of admission.    Arnulfo Larch, MD [facility]  How to contact the [facility] Attending or Consulting provider 7A - 7P or covering provider during after hours 7P -7A, for this patient.  Check the care team in [facility] and look for a) attending/consulting [facility] provider listed and b) the [facility] team listed Log into [email or web address] and use [place]'s universal password to access. If you do not have the password, please contact the hospital operator. Locate the [facility] provider you are looking for under [facility] and page to a number that you can be directly reached. If you still have difficulty reaching the provider, please page the [facility] (Director on Call) for the Hospitalists listed on amion for assistance.  11/25/2023, 5:17 AM

## 2023-11-25 NOTE — Progress Notes (Signed)
 Ambulated patient approx 125 ft. on 2L [place]. HR 123bpm/ Oxygen saturation 94%. At conclusion HR 133 bpm, Oxygen saturation 92%. MD notified.

## 2023-11-25 NOTE — TOC CM/SW Note (Signed)
 Transition of Care [facility]) - Inpatient Brief Assessment   Patient Details  Name: Robyn Ashley MRN: [id number] Date of Birth: 01/18/1951  Transition of Care [facility]) CM/SW Contact:    Grandville Lax, LCSWA Phone Number: 11/25/2023, 10:09 AM   Clinical Narrative: Transition of Care Department [facility]) has reviewed patient and no TOC needs have been identified at this time. We will continue to monitor patient advancement through interdiciplinary progression rounds. If new patient transition needs arise, please place a TOC consult.   Transition of Care Asessment: Insurance and Status: Insurance coverage has been reviewed Patient has primary care physician: Yes Home environment has been reviewed: from home Prior level of function:: independent Prior/Current Home Services: No current home services Social Drivers of Health Review: SDOH reviewed no interventions necessary Readmission risk has been reviewed: Yes Transition of care needs: no transition of care needs at this time

## 2023-11-25 NOTE — Progress Notes (Signed)
 Patient stated that the reason she has not worn the CPAP prior was due to feeding tube that she had.  She was afraid the cord would get wrapped up in tubing and pull out feeding tube.  Since tube has been taken out, she talked with her provider and they are supposed to be lining up another sleep study and check her parameters.  She prefers to not use a machine at this time until after study is done.  Patient stated that she felt better and has slept better since her breathing is better.

## 2023-11-25 NOTE — ED Notes (Signed)
 Attempted to call report, receiving RN did not answer.

## 2023-11-26 ENCOUNTER — Telehealth (HOSPITAL_COMMUNITY): Payer: Self-pay | Admitting: Pharmacy Technician

## 2023-11-26 DIAGNOSIS — J441 Chronic obstructive pulmonary disease with (acute) exacerbation: Secondary | ICD-10-CM | POA: Diagnosis not present

## 2023-11-26 MED ORDER — LOSARTAN POTASSIUM 50 MG PO TABS: 50.0000 mg | ORAL_TABLET | Freq: Every day | ORAL | 0 refills | Status: AC

## 2023-11-26 MED ORDER — ALBUTEROL SULFATE 1.25 MG/3ML IN NEBU: 1.0000 | INHALATION_SOLUTION | Freq: Four times a day (QID) | RESPIRATORY_TRACT | 0 refills | Status: AC | PRN

## 2023-11-26 MED ORDER — AZITHROMYCIN 250 MG PO TABS: 250.0000 mg | ORAL_TABLET | Freq: Every day | ORAL | 0 refills | Status: AC

## 2023-11-26 MED ORDER — FLUTICASONE FUROATE-VILANTEROL 100-25 MCG/ACT IN AEPB: 1.0000 | INHALATION_SPRAY | Freq: Every day | RESPIRATORY_TRACT | 0 refills | Status: DC

## 2023-11-26 MED ORDER — BUDESONIDE-FORMOTEROL FUMARATE 80-4.5 MCG/ACT IN AERO: 2.0000 | INHALATION_SPRAY | Freq: Two times a day (BID) | RESPIRATORY_TRACT | 0 refills | Status: DC

## 2023-11-26 MED ORDER — PREDNISONE 20 MG PO TABS: 40.0000 mg | ORAL_TABLET | Freq: Every day | ORAL | 0 refills | Status: AC

## 2023-11-26 NOTE — Telephone Encounter (Signed)
 Pharmacy Patient Advocate Encounter   Received notification from Fax that prior authorization for Albuterol  Sulfate 1.25MG /3ML nebulizer solution is required/requested.   Insurance verification completed.   The patient is insured through [facility] .   Per test claim: PA required; PA submitted to above mentioned insurance via CoverMyMeds Key/confirmation #/EOC [id number] Status is pending

## 2023-11-26 NOTE — Discharge Summary (Signed)
 Physician Discharge Summary   Patient: Robyn Ashley MRN: [id number] DOB: 1950-12-01  Admit date:     11/24/2023  Discharge date: 11/26/23  Discharge Physician: Wynetta Heckle   PCP: Kathyleen Parkins, MD   Recommendations at discharge:  Follow up with PCP in 1-2 weeks, suggest repeat sleep study, formal PFTs, and to complete work up per cardiology for DOE with coronary CTA and echocardiogram (ordered, scheduled).  Follow up blood pressure, note change from enalapril  to losartan.  Discharge Diagnoses: Principal Problem:   COPD exacerbation (HCC) Active Problems:   Acute hypoxic respiratory failure (HCC)   Asthma-COPD overlap syndrome [facility])  Hospital Course: [name] is a 73 y.o. 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 She was found to be hypoxemic with negative CXR and wheezing, admitted for AECOPD/asthma overlap. Has had significant improvement and return to baseline. Will discharge in stable conditions with details outlined below   Assessment and Plan: Acute hypoxic respiratory failure: Primarily due to AECOPD/asthma. PFTs in 2020: FEV1/FVC 71% including FEV1 reversibility of 17% post bronchodilator. Recent CT chest does appear to have upper lobe predominant emphysematous changes, which goes along with her prior history of 25-pack-year smoking. Note d-dimer is below age-adjusted cut-off at 0.54.  - Continue steroids, scheduled and inhaled bronchodilators. Nebulizer machine and medication at discharge provided.  - Started ICS/LABA. Insurance denied symbicort, preferred alternatives advair and breo ellipta. Rx breo ellipta.  - She needs repeat CPAP setting adjustment, repeat formal PFTs after convalescence.    - Use incentive spirometry after discharge, pt did not desaturate with 6 minute walk test on day of discharge despite significant dyspnea.     Chronic DOE:  - Continue work up as planned with coronary CTA 5/8 and echocardiogram 5/19, follow up with Dr. Mallipeddi thereafter.  - Given her chronic cough, will change enalapril  to losartan. Ok to continue cardioselective beta blocker at this point.  Consultants: None Procedures performed: None  Disposition: Home Diet recommendation:  Cardiac diet DISCHARGE MEDICATION: Allergies as of 11/26/2023       Reactions   Norvasc [amlodipine Besylate] Other (See Comments)   Made BP drop low   Tetracyclines & Related Other (See Comments)   Pt developed gastritis while taking tetracycline   Aspirin Other (See Comments)   Bruises    Dexilant  [dexlansoprazole ] Diarrhea   Nsaids Other (See Comments)   Bruising and bleeding        Medication List     STOP taking these medications    doxycycline 100 MG capsule Commonly known as: VIBRAMYCIN   enalapril  20 MG tablet Commonly known as: VASOTEC    methylPREDNISolone  4 MG Tbpk tablet Commonly known as: MEDROL  DOSEPAK       TAKE these medications    acetaminophen  500 MG tablet Commonly known as: TYLENOL  Take 500 mg by mouth every 8 (eight) hours as needed for mild pain (pain score 1-3).   albuterol  108 (90 Base) MCG/ACT inhaler Commonly known as: VENTOLIN  HFA Inhale 2 puffs into the lungs every 4 (four) hours as needed for shortness of breath or wheezing. What changed: Another medication with the same name was added. Make sure you understand how and when to take each.   albuterol  2 MG tablet  Commonly known as: PROVENTIL  Take 2 mg by mouth 3 (three) times daily. What changed: Another medication with the same name was added. Make sure you understand how and when to take each.   albuterol  1.25 MG/3ML nebulizer solution Commonly known as: ACCUNEB  Take 3 mLs (1.25 mg total) by  nebulization every 6 (six) hours as needed for wheezing or shortness of breath. What changed: You were already taking a medication with the same name, and this prescription was added. Make sure you understand how and when to take each.   atorvastatin  20 MG tablet Commonly known as: LIPITOR Take 20 mg by mouth at bedtime.   azithromycin 250 MG tablet Commonly known as: Zithromax Take 1 tablet (250 mg total) by mouth daily for 3 days.   calcium  carbonate 1500 (600 Ca) MG Tabs tablet Commonly known as: OSCAL Take 600 mg by mouth with breakfast.   D3 High Potency 25 MCG (1000 UT) capsule Generic drug: Cholecalciferol Take 1,000 Units by mouth daily.   E-400 180 MG (400 UNIT) Caps Generic drug: Vitamin E Take 1 capsule by mouth at bedtime.   FLUoxetine  20 MG capsule Commonly known as: PROZAC  Take 20 mg by mouth daily.   fluticasone furoate-vilanterol 100-25 MCG/ACT Aepb Commonly known as: Breo Ellipta Inhale 1 puff into the lungs daily.   levothyroxine  50 MCG tablet Commonly known as: SYNTHROID  Take 50 mcg by mouth daily before breakfast.   losartan 50 MG tablet Commonly known as: COZAAR Take 1 tablet (50 mg total) by mouth daily.   MAGNESIUM  CITRATE PO Take 1 tablet by mouth daily.   metoprolol  succinate 50 MG 24 hr tablet Commonly known as: TOPROL -XL Take 50 mg by mouth at bedtime.   mirtazapine 30 MG tablet Commonly known as: REMERON Take 30 mg by mouth daily.   omeprazole 20 MG capsule Commonly known as: PRILOSEC Take 20 mg by mouth at bedtime.   predniSONE 20 MG tablet Commonly known as: DELTASONE Take 2 tablets (40 mg total) by mouth daily with breakfast for 3 days.               Durable Medical Equipment  (From admission, onward)           Start     Ordered   11/26/23 0000  For home use only DME oxygen       Question Answer Comment  Length of Need 6 Months   Mode or (Route) Nasal cannula   Liters per Minute 2   Frequency Continuous  (stationary and portable oxygen unit needed)   Oxygen delivery system Gas      11/26/23 0914   11/25/23 1250  For home use only DME Nebulizer machine  Once       Question Answer Comment  Patient needs a nebulizer to treat with the following condition COPD (chronic obstructive pulmonary disease) (HCC)   Patient needs a nebulizer to treat with the following condition Asthma   Length of Need 12 Months   Additional equipment included Administration kit   Additional equipment included Filter      11/25/23 1249            Follow-up Information     Kathyleen Parkins, MD Follow up.   Specialty: Internal Medicine Contact information: [address] [place] [address] [phone]                Discharge Exam: Cleavon Curls Weights   11/24/23 2142  Weight: 75 kg  Well-appearing female in no distress Clear,  nonlabored RRR, no edema  Condition at discharge: stable  The results of significant diagnostics from this hospitalization (including imaging, microbiology, ancillary and laboratory) are listed below for reference.   Imaging Studies: DG Chest Portable 1 View Result Date: 11/24/2023 CLINICAL DATA:  Short of breath EXAM: PORTABLE CHEST 1 VIEW COMPARISON:  11/11/2023 FINDINGS: 2 frontal views of the chest demonstrate a stable cardiac silhouette. No acute airspace disease, effusion, or pneumothorax. No acute bony abnormalities. IMPRESSION: 1. Stable chest, no acute process. Electronically Signed   By: Bobbye Burrow M.D.   On: 11/24/2023 22:04   DG Chest 2 View Result Date: 11/11/2023 CLINICAL DATA:  Coughing, wheezing and shortness of breath for 1 month. EXAM: CHEST - 2 VIEW COMPARISON:  09/14/2022.  CT, 09/05/2023. FINDINGS: Cardiac silhouette is normal in size and configuration. Normal mediastinal and hilar contours. Lungs are hyperexpanded but clear. No pleural effusion or pneumothorax. Skeletal structures are intact. IMPRESSION: No active cardiopulmonary disease.  Electronically Signed   By: Amanda Jungling M.D.   On: 11/11/2023 15:34    Microbiology: Results for orders placed or performed during the hospital encounter of 11/24/23  Culture, blood (routine x 2)     Status: None (Preliminary result)   Collection Time: 11/24/23 10:44 PM   Specimen: BLOOD LEFT ARM  Result Value Ref Range Status   Specimen Description BLOOD LEFT ARM  Final   Special Requests   Final    BOTTLES DRAWN AEROBIC AND ANAEROBIC Blood Culture adequate volume   Culture   Final    NO GROWTH 1 DAY Performed at [facility], [address], [place], [place] [address]    Report Status PENDING  Incomplete  Culture, blood (routine x 2)     Status: None (Preliminary result)   Collection Time: 11/24/23 10:49 PM   Specimen: BLOOD LEFT HAND  Result Value Ref Range Status   Specimen Description BLOOD LEFT HAND  Final   Special Requests AEROBIC BOTTLE ONLY Blood Culture adequate volume  Final   Culture   Final    NO GROWTH 1 DAY Performed at [facility], [address], [place], [place] [address]    Report Status PENDING  Incomplete  Resp panel by RT-PCR (RSV, Flu A&B, Covid) Anterior Nasal Swab     Status: None   Collection Time: 11/24/23 11:26 PM   Specimen: Anterior Nasal Swab  Result Value Ref Range Status   SARS Coronavirus 2 by RT PCR NEGATIVE NEGATIVE Final    Comment: (NOTE) SARS-CoV-2 target nucleic acids are NOT DETECTED.  The SARS-CoV-2 RNA is generally detectable in upper respiratory specimens during the acute phase of infection. The lowest concentration of SARS-CoV-2 viral copies this assay can detect is 138 copies/mL. A negative result does not preclude SARS-Cov-2 infection and should not be used as the sole basis for treatment or other patient management decisions. A negative result may occur with  improper specimen collection/handling, submission of specimen other than nasopharyngeal swab, presence of viral mutation(s) within the areas targeted by this assay,  and inadequate number of viral copies(<138 copies/mL). A negative result must be combined with clinical observations, patient history, and epidemiological information. The expected result is Negative.  Fact Sheet for Patients:  BloggerCourse.com  Fact Sheet for Healthcare Providers:  SeriousBroker.it  This test is no t yet approved or cleared by the [place]  FDA and  has been authorized for detection and/or diagnosis of SARS-CoV-2 by FDA under an Emergency Use Authorization (EUA). This EUA will remain  in  effect (meaning this test can be used) for the duration of the COVID-19 declaration under Section 564(b)(1) of the Act, 21 U.S.C.section 360bbb-3(b)(1), unless the authorization is terminated  or revoked sooner.       Influenza A by PCR NEGATIVE NEGATIVE Final   Influenza B by PCR NEGATIVE NEGATIVE Final    Comment: (NOTE) The Xpert Xpress SARS-CoV-2/FLU/RSV plus assay is intended as an aid in the diagnosis of influenza from Nasopharyngeal swab specimens and should not be used as a sole basis for treatment. Nasal washings and aspirates are unacceptable for Xpert Xpress SARS-CoV-2/FLU/RSV testing.  Fact Sheet for Patients: BloggerCourse.com  Fact Sheet for Healthcare Providers: SeriousBroker.it  This test is not yet approved or cleared by the [place]  FDA and has been authorized for detection and/or diagnosis of SARS-CoV-2 by FDA under an Emergency Use Authorization (EUA). This EUA will remain in effect (meaning this test can be used) for the duration of the COVID-19 declaration under Section 564(b)(1) of the Act, 21 U.S.C. section 360bbb-3(b)(1), unless the authorization is terminated or revoked.     Resp Syncytial Virus by PCR NEGATIVE NEGATIVE Final    Comment: (NOTE) Fact Sheet for Patients: BloggerCourse.com  Fact Sheet for  Healthcare Providers: SeriousBroker.it  This test is not yet approved or cleared by the [place]  FDA and has been authorized for detection and/or diagnosis of SARS-CoV-2 by FDA under an Emergency Use Authorization (EUA). This EUA will remain in effect (meaning this test can be used) for the duration of the COVID-19 declaration under Section 564(b)(1) of the Act, 21 U.S.C. section 360bbb-3(b)(1), unless the authorization is terminated or revoked.  Performed at [facility], [address], [place], [place] [address]   Respiratory (~20 pathogens) panel by PCR     Status: None   Collection Time: 11/25/23  5:04 PM   Specimen: Nasopharyngeal Swab; Respiratory  Result Value Ref Range Status   Adenovirus NOT DETECTED NOT DETECTED Final   Coronavirus 229E NOT DETECTED NOT DETECTED Final    Comment: (NOTE) The Coronavirus on the Respiratory Panel, DOES NOT test for the novel  Coronavirus (2019 nCoV)    Coronavirus HKU1 NOT DETECTED NOT DETECTED Final   Coronavirus NL63 NOT DETECTED NOT DETECTED Final   Coronavirus OC43 NOT DETECTED NOT DETECTED Final   Metapneumovirus NOT DETECTED NOT DETECTED Final   Rhinovirus / Enterovirus NOT DETECTED NOT DETECTED Final   Influenza A NOT DETECTED NOT DETECTED Final   Influenza B NOT DETECTED NOT DETECTED Final   Parainfluenza Virus 1 NOT DETECTED NOT DETECTED Final   Parainfluenza Virus 2 NOT DETECTED NOT DETECTED Final   Parainfluenza Virus 3 NOT DETECTED NOT DETECTED Final   Parainfluenza Virus 4 NOT DETECTED NOT DETECTED Final   Respiratory Syncytial Virus NOT DETECTED NOT DETECTED Final   Bordetella pertussis NOT DETECTED NOT DETECTED Final   Bordetella Parapertussis NOT DETECTED NOT DETECTED Final   Chlamydophila pneumoniae NOT DETECTED NOT DETECTED Final   Mycoplasma pneumoniae NOT DETECTED NOT DETECTED Final    Comment: Performed at [facility], [address] [place], [place] [address]     Labs: CBC: Recent Labs  Lab 11/24/23 2201 11/25/23 0516  WBC 9.9 10.8*  NEUTROABS 5.7  --   HGB 11.1* 10.6*  HCT 36.3 35.1*  MCV 87.3 87.1  PLT 355 318   Basic Metabolic Panel: Recent Labs  Lab 11/24/23 2201 11/25/23 0516  NA 136 140  K 3.3* 3.3*  CL 101 102  CO2 26 23  GLUCOSE 101* 169*  BUN 14 16  CREATININE 0.84 0.83  CALCIUM  8.9 8.9  MG  --  1.7  PHOS  --  3.2   Liver Function Tests: No results for input(s): "AST", "ALT", "ALKPHOS", "BILITOT", "PROT", "ALBUMIN" in the last 168 hours. CBG: No results for input(s): "GLUCAP" in the last 168 hours.  Discharge time spent: greater than 30 minutes.  Signed: Wynetta Heckle, MD [facility] 11/26/2023

## 2023-11-26 NOTE — TOC Transition Note (Signed)
 Transition of Care [facility]) - Discharge Note   Patient Details  Name: Robyn Ashley MRN: [id number] Date of Birth: Apr 27, 1951  Transition of Care [facility]) CM/SW Contact:  Linnea Richards, LCSW Phone Number: 11/26/2023, 10:42 AM   Clinical Narrative:     Pt stable for dc today per MD. Nebulizer machine order and home O2 entered by MD. RN performed sat study and pt does not qualify for home O2.  Spoke with pt/dtr to review. They understand that pt does not meet criteria for home O2. CMS provider options for neb machine reviewed. Referred as requested. [employer] will deliver to bedside.  No other TOC needs for dc.  Final next level of care: Home/Self Care Barriers to Discharge: Barriers Resolved   Patient Goals and CMS Choice Patient states their goals for this hospitalization and ongoing recovery are:: go home CMS Medicare.gov Compare Post Acute Care list provided to:: Patient Represenative (must comment) Choice offered to / list presented to : Adult Children      Discharge Placement                       Discharge Plan and Services Additional resources added to the After Visit Summary for                  DME Arranged: Nebulizer machine DME Agency: [employer] Date DME Agency Contacted: 11/26/23   Representative spoke with at DME Agency: Gladys Lamp            Social Drivers of Health (SDOH) Interventions SDOH Screenings   Food Insecurity: No Food Insecurity (11/25/2023)  Housing: Low Risk  (11/25/2023)  Transportation Needs: No Transportation Needs (11/25/2023)  Utilities: Not At Risk (11/25/2023)  Social Connections: Moderately Integrated (11/25/2023)  Tobacco Use: Medium Risk (11/24/2023)     Readmission Risk Interventions     No data to display

## 2023-11-26 NOTE — Care Management Important Message (Signed)
 Important Message  Patient Details  Name: KELTY ASTI MRN: [id number] Date of Birth: 1950-09-26   Important Message Given:  N/A - LOS <3 / Initial given by admissions     Neila Bally 11/26/2023, 10:42 AM

## 2023-11-26 NOTE — Telephone Encounter (Signed)
 Pharmacy Patient Advocate Encounter  Received notification from [facility] that Prior Authorization for Albuterol  Sulfate 1.25MG /3ML nebulizer solution  has been DENIED.  Full denial letter will be uploaded to the media tab. See denial reason below. ALBUTEROL  SULFATE Nebu Soln is used in a nebulizer. A nebulizer is a piece of durable medical equipment (DME). Drugs used with DME in the home are covered under Medicare Part B. Our records show that you do not live in a long term care (LTC) facility. We cannot pay for drugs under Medicare Part D if they are covered under Medicare Part A or B. We did not decide whether ALBUTEROL  SULFATE Nebu Soln is medically necessary. We made our decision only on the fact that we cannot pay for the drug under Medicare Part D  PA #/Case ID/Reference #: [id number]

## 2023-11-26 NOTE — Progress Notes (Signed)
 Patient states understanding of discharge instructions.

## 2023-11-26 NOTE — Progress Notes (Signed)
 Patient ambulated 400 ft on RA. Baseline HR 80, sat 97% sitting. At 243ft pt began to get winded and paused for approx 2 minutes to catch her breath, lowest O2 sat 94% at that time.

## 2023-11-28 ENCOUNTER — Ambulatory Visit: Payer: Self-pay

## 2023-11-28 DIAGNOSIS — G47419 Narcolepsy without cataplexy: Secondary | ICD-10-CM | POA: Diagnosis not present

## 2023-11-28 DIAGNOSIS — E663 Overweight: Secondary | ICD-10-CM | POA: Diagnosis not present

## 2023-11-28 DIAGNOSIS — G4733 Obstructive sleep apnea (adult) (pediatric): Secondary | ICD-10-CM | POA: Diagnosis not present

## 2023-11-28 DIAGNOSIS — F909 Attention-deficit hyperactivity disorder, unspecified type: Secondary | ICD-10-CM | POA: Diagnosis not present

## 2023-11-28 DIAGNOSIS — J449 Chronic obstructive pulmonary disease, unspecified: Secondary | ICD-10-CM | POA: Diagnosis not present

## 2023-11-28 DIAGNOSIS — Z6829 Body mass index (BMI) 29.0-29.9, adult: Secondary | ICD-10-CM | POA: Diagnosis not present

## 2023-11-28 DIAGNOSIS — J441 Chronic obstructive pulmonary disease with (acute) exacerbation: Secondary | ICD-10-CM | POA: Diagnosis not present

## 2023-11-28 DIAGNOSIS — I10 Essential (primary) hypertension: Secondary | ICD-10-CM | POA: Diagnosis not present

## 2023-11-28 NOTE — Telephone Encounter (Signed)
 Patient recently hospitalized for COPD exacerbation. Patient wasn't seen by pulmonary during her hospitalization. Patient states she is asymptomatic with no concerns currently. Explained a referral needed to be placed by PCP and then patient will be able to call to schedule an appointment. Explained that patient may need to come to [place] office due to availability at [facility] for her first appointment. Patient saw PCP today and stated she would call her PCP to have a referral placed. Patient verbalized understanding and all questions answered.   Copied from CRM [id number]. Topic: Clinical - Red Word Triage >> Nov 28, 2023 12:21 PM Juliana Ocean wrote: Red Word that prompted transfer to Nurse Triage: hospitalized over weekend w/ copd exacerbation w/ asthma Reason for Disposition  Health Information question, no triage required and triager able to answer question  Answer Assessment - Initial Assessment Questions 1. REASON FOR CALL or QUESTION: "What is your reason for calling today?" or "How can I best help you?" or "What question do you have that I can help answer?"     Patient calling about making a new patient appointment for pulmonary. Patient was in the hospital over the weekend for COPD exacerbation. Patient didn't see pulmonary in the hospital nor has she seen them before. Patient is needing to be restarted on CPAP for OSA.  Protocols used: Information Only Call - No Triage-A-AH

## 2023-11-30 LAB — CULTURE, BLOOD (ROUTINE X 2)
Culture: NO GROWTH
Culture: NO GROWTH
Special Requests: ADEQUATE
Special Requests: ADEQUATE

## 2023-12-04 ENCOUNTER — Telehealth (HOSPITAL_COMMUNITY): Payer: Self-pay | Admitting: *Deleted

## 2023-12-04 NOTE — Telephone Encounter (Signed)
 Reaching out to patient to offer assistance regarding upcoming cardiac imaging study; pt verbalizes understanding of appt date/time, parking situation and where to check in, pre-test NPO status and medications ordered, and verified current allergies; name and call back number provided for further questions should they arise Johney Frame RN Navigator Cardiac Imaging [facility] [phone] office [phone] cell

## 2023-12-04 NOTE — Telephone Encounter (Signed)
 Attempted to call patient regarding upcoming cardiac CT appointment. Left message on voicemail with name and callback number  Larey Brick RN Navigator Cardiac Imaging [facility] [phone] Office [phone] Cell

## 2023-12-05 ENCOUNTER — Ambulatory Visit (HOSPITAL_COMMUNITY)
Admission: RE | Admit: 2023-12-05 | Discharge: 2023-12-05 | Disposition: A | Discharge status: Home / Self Care | Source: Ambulatory Visit | Attending: Internal Medicine | Admitting: Internal Medicine

## 2023-12-05 DIAGNOSIS — I209 Angina pectoris, unspecified: Secondary | ICD-10-CM

## 2023-12-05 DIAGNOSIS — I700 Atherosclerosis of aorta: Secondary | ICD-10-CM | POA: Diagnosis not present

## 2023-12-05 MED ORDER — DILTIAZEM HCL 25 MG/5ML IV SOLN: 10.0000 mg | INTRAVENOUS | Status: DC | PRN

## 2023-12-05 MED ORDER — NITROGLYCERIN 0.4 MG SL SUBL
0.8000 mg | SUBLINGUAL_TABLET | Freq: Once | SUBLINGUAL | Status: AC
  Administered 2023-12-05: 0.8 mg via SUBLINGUAL

## 2023-12-05 MED ORDER — METOPROLOL TARTRATE 5 MG/5ML IV SOLN: 10.0000 mg | Freq: Once | INTRAVENOUS | Status: DC | PRN

## 2023-12-05 MED ORDER — IOHEXOL 350 MG/ML SOLN
100.0000 mL | Freq: Once | INTRAVENOUS | Status: AC | PRN
  Administered 2023-12-05: 100 mL via INTRAVENOUS

## 2023-12-13 ENCOUNTER — Ambulatory Visit: Payer: Self-pay | Admitting: Internal Medicine

## 2023-12-13 DIAGNOSIS — Z79899 Other long term (current) drug therapy: Secondary | ICD-10-CM

## 2023-12-16 ENCOUNTER — Ambulatory Visit (HOSPITAL_COMMUNITY)
Admission: RE | Admit: 2023-12-16 | Discharge: 2023-12-16 | Disposition: A | Discharge status: Home / Self Care | Source: Ambulatory Visit | Attending: Internal Medicine | Admitting: Internal Medicine

## 2023-12-16 DIAGNOSIS — R0609 Other forms of dyspnea: Secondary | ICD-10-CM | POA: Insufficient documentation

## 2023-12-16 LAB — ECHOCARDIOGRAM COMPLETE
AR max vel: 1.82 cm2
AV Area VTI: 2.04 cm2
AV Area mean vel: 2.06 cm2
AV Mean grad: 6.7 mmHg
AV Peak grad: 13 mmHg
Ao pk vel: 1.8 m/s
Area-P 1/2: 2.54 cm2
S' Lateral: 2.7 cm

## 2023-12-16 NOTE — Progress Notes (Signed)
*  PRELIMINARY RESULTS* Echocardiogram 2D Echocardiogram has been performed.  Bernis Brisker 12/16/2023, 11:23 AM

## 2023-12-28 DIAGNOSIS — E782 Mixed hyperlipidemia: Secondary | ICD-10-CM | POA: Diagnosis not present

## 2023-12-28 DIAGNOSIS — J449 Chronic obstructive pulmonary disease, unspecified: Secondary | ICD-10-CM | POA: Diagnosis not present

## 2024-01-10 ENCOUNTER — Telehealth: Payer: Self-pay | Admitting: Internal Medicine

## 2024-01-10 ENCOUNTER — Other Ambulatory Visit (HOSPITAL_COMMUNITY): Payer: Self-pay

## 2024-01-10 DIAGNOSIS — Z6831 Body mass index (BMI) 31.0-31.9, adult: Secondary | ICD-10-CM | POA: Diagnosis not present

## 2024-01-10 DIAGNOSIS — F909 Attention-deficit hyperactivity disorder, unspecified type: Secondary | ICD-10-CM | POA: Diagnosis not present

## 2024-01-10 DIAGNOSIS — I10 Essential (primary) hypertension: Secondary | ICD-10-CM | POA: Diagnosis not present

## 2024-01-10 DIAGNOSIS — J449 Chronic obstructive pulmonary disease, unspecified: Secondary | ICD-10-CM | POA: Diagnosis not present

## 2024-01-10 DIAGNOSIS — I341 Nonrheumatic mitral (valve) prolapse: Secondary | ICD-10-CM | POA: Diagnosis not present

## 2024-01-10 DIAGNOSIS — E6609 Other obesity due to excess calories: Secondary | ICD-10-CM | POA: Diagnosis not present

## 2024-01-10 DIAGNOSIS — G47419 Narcolepsy without cataplexy: Secondary | ICD-10-CM | POA: Diagnosis not present

## 2024-01-10 DIAGNOSIS — D508 Other iron deficiency anemias: Secondary | ICD-10-CM | POA: Diagnosis not present

## 2024-01-10 DIAGNOSIS — I872 Venous insufficiency (chronic) (peripheral): Secondary | ICD-10-CM | POA: Diagnosis not present

## 2024-01-10 DIAGNOSIS — G4733 Obstructive sleep apnea (adult) (pediatric): Secondary | ICD-10-CM | POA: Diagnosis not present

## 2024-01-10 DIAGNOSIS — R5381 Other malaise: Secondary | ICD-10-CM | POA: Diagnosis not present

## 2024-01-10 MED ORDER — DAPAGLIFLOZIN PROPANEDIOL 10 MG PO TABS: 10.0000 mg | ORAL_TABLET | Freq: Every day | ORAL | 11 refills | Status: AC

## 2024-01-10 NOTE — Telephone Encounter (Signed)
The patient has been notified of the result and verbalized understanding.  All questions (if any) were answered.     

## 2024-01-10 NOTE — Telephone Encounter (Signed)
 Follow Up:       Patient is returning  call, concerning her Echo and CT  results.

## 2024-01-10 NOTE — Telephone Encounter (Signed)
 Order placed for Farxiga 10 mg and BMP. Patient aware.

## 2024-01-10 NOTE — Telephone Encounter (Signed)
-----   Message from Vishnu P Mallipeddi sent at 01/03/2024  2:53 PM EDT ----- Normal LV function, G1 DD with elevated left atrial pressure, normal RV function, no valvular heart disease and CVP 3 mmHg.  Not volume overloaded based on echocardiogram report but has diastolic dysfunction for which we will need to start Jardiance or Farxiga 10 mg once daily (either is okay, depending on her insurance).  Obtain BMP in 5 days.

## 2024-01-15 DIAGNOSIS — H3342 Traction detachment of retina, left eye: Secondary | ICD-10-CM | POA: Diagnosis not present

## 2024-01-17 ENCOUNTER — Other Ambulatory Visit (HOSPITAL_COMMUNITY): Payer: Self-pay

## 2024-01-17 ENCOUNTER — Encounter: Payer: Self-pay | Admitting: Internal Medicine

## 2024-01-20 DIAGNOSIS — M6281 Muscle weakness (generalized): Secondary | ICD-10-CM | POA: Diagnosis not present

## 2024-01-20 DIAGNOSIS — R2689 Other abnormalities of gait and mobility: Secondary | ICD-10-CM | POA: Diagnosis not present

## 2024-01-20 DIAGNOSIS — R5381 Other malaise: Secondary | ICD-10-CM | POA: Diagnosis not present

## 2024-01-24 DIAGNOSIS — Z79899 Other long term (current) drug therapy: Secondary | ICD-10-CM | POA: Diagnosis not present

## 2024-01-24 DIAGNOSIS — R5381 Other malaise: Secondary | ICD-10-CM | POA: Diagnosis not present

## 2024-01-24 DIAGNOSIS — R2689 Other abnormalities of gait and mobility: Secondary | ICD-10-CM | POA: Diagnosis not present

## 2024-01-24 DIAGNOSIS — M6281 Muscle weakness (generalized): Secondary | ICD-10-CM | POA: Diagnosis not present

## 2024-01-25 LAB — BASIC METABOLIC PANEL WITH GFR
BUN: 18 mg/dL (ref 7–25)
CO2: 30 mmol/L (ref 20–32)
Calcium: 9.1 mg/dL (ref 8.6–10.4)
Chloride: 104 mmol/L (ref 98–110)
Creat: 0.91 mg/dL (ref 0.60–1.00)
Glucose, Bld: 74 mg/dL (ref 65–99)
Potassium: 4 mmol/L (ref 3.5–5.3)
Sodium: 142 mmol/L (ref 135–146)
eGFR: 68 mL/min/{1.73_m2} (ref >=60)

## 2024-01-26 NOTE — Progress Notes (Deleted)
 Robyn Ashley, female    DOB: 08/09/1950    MRN: [id number]   Brief patient profile:  35  yowf  *** former Robyn Ashley Pt self-referred back to pulmonary clinic in [place]  01/29/2024  for AB last seen 03/2020  PFT 05/26/19 >> FEV1 1.74 (79%),  ratio 0.80, TLC 4.27 (99%), DLCO 82%, +BD   History of Present Illness  01/29/2024  Pulmonary/ 1st office eval/ Robyn Ashley /  Office  No chief complaint on file.    Dyspnea:  *** Cough: *** Sleep: *** SABA use: *** 02 use:*** LDSCT:***  No obvious day to day or daytime pattern/variability or assoc excess/ purulent sputum or mucus plugs or hemoptysis or cp or chest tightness, subjective wheeze or overt sinus or hb symptoms.    Also denies any obvious fluctuation of symptoms with weather or environmental changes or other aggravating or alleviating factors except as outlined above   No unusual exposure hx or h/o childhood pna/ asthma or knowledge of premature birth.  Current Allergies, Complete Past Medical History, Past Surgical History, Family History, and Social History were reviewed in [name] record.  ROS  The following are not active complaints unless bolded Hoarseness, sore throat, dysphagia, dental problems, itching, sneezing,  nasal congestion or discharge of excess mucus or purulent secretions, ear ache,   fever, chills, sweats, unintended wt loss or wt gain, classically pleuritic or exertional cp,  orthopnea pnd or arm/hand swelling  or leg swelling, presyncope, palpitations, abdominal pain, anorexia, nausea, vomiting, diarrhea  or change in bowel habits or change in bladder habits, change in stools or change in urine, dysuria, hematuria,  rash, arthralgias, visual complaints, headache, numbness, weakness or ataxia or problems with walking or coordination,  change in mood or  memory.            Outpatient Medications Prior to Visit  Medication Sig Dispense Refill   acetaminophen  (TYLENOL ) 500 MG tablet Take  500 mg by mouth every 8 (eight) hours as needed for mild pain (pain score 1-3).     albuterol  (ACCUNEB ) 1.25 MG/3ML nebulizer solution Take 3 mLs (1.25 mg total) by nebulization every 6 (six) hours as needed for wheezing or shortness of breath. 75 mL 0   albuterol  (PROVENTIL ) 2 MG tablet Take 2 mg by mouth 3 (three) times daily.     albuterol  (VENTOLIN  HFA) 108 (90 Base) MCG/ACT inhaler Inhale 2 puffs into the lungs every 4 (four) hours as needed for shortness of breath or wheezing.     atorvastatin  (LIPITOR) 20 MG tablet Take 20 mg by mouth at bedtime.      calcium  carbonate (OSCAL) 1500 (600 Ca) MG TABS tablet Take 600 mg by mouth with breakfast.     D3 HIGH POTENCY 25 MCG (1000 UT) capsule Take 1,000 Units by mouth daily.     dapagliflozin  propanediol (FARXIGA ) 10 MG TABS tablet Take 1 tablet (10 mg total) by mouth daily before breakfast. 30 tablet 11   E-400 180 MG (400 UNIT) CAPS Take 1 capsule by mouth at bedtime.     FLUoxetine  (PROZAC ) 20 MG capsule Take 20 mg by mouth daily.     fluticasone  furoate-vilanterol (BREO ELLIPTA ) 100-25 MCG/ACT AEPB Inhale 1 puff into the lungs daily. 28 each 0   levothyroxine  (SYNTHROID , LEVOTHROID) 50 MCG tablet Take 50 mcg by mouth daily before breakfast.   2   losartan  (COZAAR ) 50 MG tablet Take 1 tablet (50 mg total) by mouth daily. 30 tablet 0  MAGNESIUM  CITRATE PO Take 1 tablet by mouth daily.     metoprolol  succinate (TOPROL -XL) 50 MG 24 hr tablet Take 50 mg by mouth at bedtime.   10   mirtazapine  (REMERON ) 30 MG tablet Take 30 mg by mouth daily.     omeprazole (PRILOSEC) 20 MG capsule Take 20 mg by mouth at bedtime.     No facility-administered medications prior to visit.    Past Medical History:  Diagnosis Date   Allergy    Anemia    Anginal pain (HCC) 2015   Depression    Dyslipidemia 05/10/2020   Dysrhythmia 2015   SVT   Essential hypertension 05/10/2020   GERD (gastroesophageal reflux disease)    Hiatal hernia    History of  degenerative disc disease    History of nuclear stress test 07/27/2013   in care everywhere per cardiology note by dr norman:  test normal   Hyperlipidemia    Hypertension    Hypothyroidism    Hypothyroidism (acquired) 05/10/2020   Intermittent palpitations    MVP (mitral valve prolapse)    per pt dx 1980s,  last echo done 2016 by dr norman [facility] cardiology in [place]) and last cardiology visit 11-02-2014 in epic;   01-27-2019 per pt episodic palpitations and takes toprol , currently followed by pcp   Narcolepsy    OSA (obstructive sleep apnea)    Not on CPAP   Osteoporosis    osteopenia   TMJ arthralgia       Objective:     There were no vitals taken for this visit.         Assessment   No problem-specific Assessment & Plan notes found for this encounter.     Ozell America, MD 01/26/2024

## 2024-01-27 DIAGNOSIS — I700 Atherosclerosis of aorta: Secondary | ICD-10-CM | POA: Diagnosis not present

## 2024-01-27 DIAGNOSIS — J449 Chronic obstructive pulmonary disease, unspecified: Secondary | ICD-10-CM | POA: Diagnosis not present

## 2024-01-28 DIAGNOSIS — D485 Neoplasm of uncertain behavior of skin: Secondary | ICD-10-CM | POA: Diagnosis not present

## 2024-01-28 DIAGNOSIS — G4733 Obstructive sleep apnea (adult) (pediatric): Secondary | ICD-10-CM | POA: Diagnosis not present

## 2024-01-28 DIAGNOSIS — J449 Chronic obstructive pulmonary disease, unspecified: Secondary | ICD-10-CM | POA: Diagnosis not present

## 2024-01-28 DIAGNOSIS — G47419 Narcolepsy without cataplexy: Secondary | ICD-10-CM | POA: Diagnosis not present

## 2024-01-28 DIAGNOSIS — Z6831 Body mass index (BMI) 31.0-31.9, adult: Secondary | ICD-10-CM | POA: Diagnosis not present

## 2024-01-28 DIAGNOSIS — I872 Venous insufficiency (chronic) (peripheral): Secondary | ICD-10-CM | POA: Diagnosis not present

## 2024-01-28 DIAGNOSIS — R5381 Other malaise: Secondary | ICD-10-CM | POA: Diagnosis not present

## 2024-01-28 DIAGNOSIS — I341 Nonrheumatic mitral (valve) prolapse: Secondary | ICD-10-CM | POA: Diagnosis not present

## 2024-01-28 DIAGNOSIS — M6281 Muscle weakness (generalized): Secondary | ICD-10-CM | POA: Diagnosis not present

## 2024-01-28 DIAGNOSIS — E6609 Other obesity due to excess calories: Secondary | ICD-10-CM | POA: Diagnosis not present

## 2024-01-28 DIAGNOSIS — R2689 Other abnormalities of gait and mobility: Secondary | ICD-10-CM | POA: Diagnosis not present

## 2024-01-28 DIAGNOSIS — F909 Attention-deficit hyperactivity disorder, unspecified type: Secondary | ICD-10-CM | POA: Diagnosis not present

## 2024-01-28 DIAGNOSIS — I10 Essential (primary) hypertension: Secondary | ICD-10-CM | POA: Diagnosis not present

## 2024-01-29 ENCOUNTER — Telehealth: Payer: Self-pay

## 2024-01-29 ENCOUNTER — Ambulatory Visit: Admitting: Internal Medicine

## 2024-01-29 NOTE — Telephone Encounter (Signed)
 ATC pt regarding appt for today, she has been seen multiple time for sleep and only once for pulm in 2020. LMTCB

## 2024-01-30 DIAGNOSIS — R5381 Other malaise: Secondary | ICD-10-CM | POA: Diagnosis not present

## 2024-01-30 DIAGNOSIS — M6281 Muscle weakness (generalized): Secondary | ICD-10-CM | POA: Diagnosis not present

## 2024-01-30 DIAGNOSIS — R2689 Other abnormalities of gait and mobility: Secondary | ICD-10-CM | POA: Diagnosis not present

## 2024-02-04 DIAGNOSIS — R5381 Other malaise: Secondary | ICD-10-CM | POA: Diagnosis not present

## 2024-02-04 DIAGNOSIS — R2689 Other abnormalities of gait and mobility: Secondary | ICD-10-CM | POA: Diagnosis not present

## 2024-02-04 DIAGNOSIS — M6281 Muscle weakness (generalized): Secondary | ICD-10-CM | POA: Diagnosis not present

## 2024-02-11 DIAGNOSIS — R2689 Other abnormalities of gait and mobility: Secondary | ICD-10-CM | POA: Diagnosis not present

## 2024-02-11 DIAGNOSIS — M6281 Muscle weakness (generalized): Secondary | ICD-10-CM | POA: Diagnosis not present

## 2024-02-11 DIAGNOSIS — R5381 Other malaise: Secondary | ICD-10-CM | POA: Diagnosis not present

## 2024-02-14 DIAGNOSIS — R5381 Other malaise: Secondary | ICD-10-CM | POA: Diagnosis not present

## 2024-02-14 DIAGNOSIS — M6281 Muscle weakness (generalized): Secondary | ICD-10-CM | POA: Diagnosis not present

## 2024-02-14 DIAGNOSIS — R2689 Other abnormalities of gait and mobility: Secondary | ICD-10-CM | POA: Diagnosis not present

## 2024-02-19 DIAGNOSIS — R5381 Other malaise: Secondary | ICD-10-CM | POA: Diagnosis not present

## 2024-02-19 DIAGNOSIS — R2689 Other abnormalities of gait and mobility: Secondary | ICD-10-CM | POA: Diagnosis not present

## 2024-02-19 DIAGNOSIS — M6281 Muscle weakness (generalized): Secondary | ICD-10-CM | POA: Diagnosis not present

## 2024-02-21 DIAGNOSIS — M6281 Muscle weakness (generalized): Secondary | ICD-10-CM | POA: Diagnosis not present

## 2024-02-21 DIAGNOSIS — R2689 Other abnormalities of gait and mobility: Secondary | ICD-10-CM | POA: Diagnosis not present

## 2024-02-21 DIAGNOSIS — R5381 Other malaise: Secondary | ICD-10-CM | POA: Diagnosis not present

## 2024-02-24 DIAGNOSIS — R5381 Other malaise: Secondary | ICD-10-CM | POA: Diagnosis not present

## 2024-02-24 DIAGNOSIS — M6281 Muscle weakness (generalized): Secondary | ICD-10-CM | POA: Diagnosis not present

## 2024-02-24 DIAGNOSIS — R2689 Other abnormalities of gait and mobility: Secondary | ICD-10-CM | POA: Diagnosis not present

## 2024-02-27 DIAGNOSIS — L821 Other seborrheic keratosis: Secondary | ICD-10-CM | POA: Diagnosis not present

## 2024-02-27 DIAGNOSIS — R5381 Other malaise: Secondary | ICD-10-CM | POA: Diagnosis not present

## 2024-02-27 DIAGNOSIS — J449 Chronic obstructive pulmonary disease, unspecified: Secondary | ICD-10-CM | POA: Diagnosis not present

## 2024-02-27 DIAGNOSIS — K219 Gastro-esophageal reflux disease without esophagitis: Secondary | ICD-10-CM | POA: Diagnosis not present

## 2024-02-27 DIAGNOSIS — M6281 Muscle weakness (generalized): Secondary | ICD-10-CM | POA: Diagnosis not present

## 2024-02-27 DIAGNOSIS — C4372 Malignant melanoma of left lower limb, including hip: Secondary | ICD-10-CM | POA: Diagnosis not present

## 2024-02-27 DIAGNOSIS — R2689 Other abnormalities of gait and mobility: Secondary | ICD-10-CM | POA: Diagnosis not present

## 2024-03-02 DIAGNOSIS — M6281 Muscle weakness (generalized): Secondary | ICD-10-CM | POA: Diagnosis not present

## 2024-03-02 DIAGNOSIS — R2689 Other abnormalities of gait and mobility: Secondary | ICD-10-CM | POA: Diagnosis not present

## 2024-03-02 DIAGNOSIS — R5381 Other malaise: Secondary | ICD-10-CM | POA: Diagnosis not present

## 2024-03-04 DIAGNOSIS — R2689 Other abnormalities of gait and mobility: Secondary | ICD-10-CM | POA: Diagnosis not present

## 2024-03-04 DIAGNOSIS — R413 Other amnesia: Secondary | ICD-10-CM | POA: Diagnosis not present

## 2024-03-05 DIAGNOSIS — R5381 Other malaise: Secondary | ICD-10-CM | POA: Diagnosis not present

## 2024-03-05 DIAGNOSIS — R2689 Other abnormalities of gait and mobility: Secondary | ICD-10-CM | POA: Diagnosis not present

## 2024-03-05 DIAGNOSIS — M6281 Muscle weakness (generalized): Secondary | ICD-10-CM | POA: Diagnosis not present

## 2024-03-10 DIAGNOSIS — R5381 Other malaise: Secondary | ICD-10-CM | POA: Diagnosis not present

## 2024-03-10 DIAGNOSIS — R2689 Other abnormalities of gait and mobility: Secondary | ICD-10-CM | POA: Diagnosis not present

## 2024-03-10 DIAGNOSIS — M6281 Muscle weakness (generalized): Secondary | ICD-10-CM | POA: Diagnosis not present

## 2024-03-12 ENCOUNTER — Ambulatory Visit (INDEPENDENT_AMBULATORY_CARE_PROVIDER_SITE_OTHER): Admitting: Internal Medicine

## 2024-03-12 ENCOUNTER — Other Ambulatory Visit (HOSPITAL_COMMUNITY)
Admission: RE | Admit: 2024-03-12 | Discharge: 2024-03-12 | Disposition: A | Discharge status: Home / Self Care | Source: Ambulatory Visit | Attending: Internal Medicine | Admitting: Internal Medicine

## 2024-03-12 VITALS — BP 118/62 | HR 68 | Ht 61.5 in | Wt 172.6 lb

## 2024-03-12 DIAGNOSIS — R0609 Other forms of dyspnea: Secondary | ICD-10-CM | POA: Diagnosis not present

## 2024-03-12 DIAGNOSIS — R5381 Other malaise: Secondary | ICD-10-CM | POA: Diagnosis not present

## 2024-03-12 DIAGNOSIS — R2689 Other abnormalities of gait and mobility: Secondary | ICD-10-CM | POA: Diagnosis not present

## 2024-03-12 DIAGNOSIS — C4372 Malignant melanoma of left lower limb, including hip: Secondary | ICD-10-CM | POA: Diagnosis not present

## 2024-03-12 DIAGNOSIS — M6281 Muscle weakness (generalized): Secondary | ICD-10-CM | POA: Diagnosis not present

## 2024-03-12 LAB — BASIC METABOLIC PANEL WITH GFR
Anion gap: 10 (ref 5–15)
BUN: 17 mg/dL (ref 8–23)
CO2: 26 mmol/L (ref 22–32)
Calcium: 8.9 mg/dL (ref 8.9–10.3)
Chloride: 105 mmol/L (ref 98–111)
Creatinine, Ser: 0.97 mg/dL (ref 0.44–1.00)
GFR, Estimated: >60 mL/min (ref >60)
Glucose, Bld: 119 mg/dL — ABNORMAL HIGH (ref 70–99)
Potassium: 3.5 mmol/L (ref 3.5–5.1)
Sodium: 141 mmol/L (ref 135–145)

## 2024-03-12 LAB — CBC
HCT: 33.8 % — ABNORMAL LOW (ref 36.0–46.0)
Hemoglobin: 10.3 g/dL — ABNORMAL LOW (ref 12.0–15.0)
MCH: 25.8 pg — ABNORMAL LOW (ref 26.0–34.0)
MCHC: 30.5 g/dL (ref 30.0–36.0)
MCV: 84.5 fL (ref 80.0–100.0)
Platelets: 355 K/uL (ref 150–400)
RBC: 4 MIL/uL (ref 3.87–5.11)
RDW: 14.2 % (ref 11.5–15.5)
WBC: 8.2 K/uL (ref 4.0–10.5)
nRBC: 0 % (ref 0.0–0.2)

## 2024-03-12 NOTE — Patient Instructions (Addendum)
  [facility]PT OF [facility] [address] [place] [address] Dept: [phone] Loc: [phone]  Robyn Ashley  03/12/2024  You are scheduled for a Cardiac Catheterization on Monday, August 18 with Dr. Lonni Cash.  1. Please arrive at the [facility] (Main Entrance A) at [facility]: [address] [place], [place] [address] at 8:30 AM (This time is 2 hour(s) before your procedure to ensure your preparation).   Free valet parking service is available. You will check in at ADMITTING. The support person will be asked to wait in the waiting room.  It is OK to have someone drop you off and come back when you are ready to be discharged.    Special note: Every effort is made to have your procedure done on time. Please understand that emergencies sometimes delay scheduled procedures.  2. Diet: No solid foods after midnight. You may have clear liquids until you arrive at the hospital.  List of approved liquids water , clear juice, clear tea, black coffee, fruit juices, non-citric and without pulp, carbonated beverages, Gatorade, Kool -Aid, plain Jello-O and plain ice popsicles.   3. Hydration: You need to be well hydrated before your procedure time. You may drink approved liquids (see below) until you arrive at the hospital. On the way to the hospital, please drink a 16-oz (1 plastic bottle) of water .   List of approved liquids water , clear juice, clear tea, black coffee, fruit juices, non-citric and without pulp, carbonated beverages, Gatorade, Kool -Aid, plain Jello-O and plain ice popsicles.   4. Labs: You will need to have blood drawn TODAY,CBC,BMET  5. Medication instructions in preparation for your procedure:   Contrast Allergy: No  Stop taking, Lasix  (Furosemide )  Monday, August 18,   HOLD Farxiga  starting Saturday August 16 th     6. Plan to go home the same day, you will only stay  overnight if medically necessary. 7. Bring a current list of your medications and current insurance cards. 8. You MUST have a responsible person to drive you home. 9. Someone MUST be with you the first 24 hours after you arrive home or your discharge will be delayed. 10. Please wear clothes that are easy to get on and off and wear slip-on shoes.  Thank you for allowing us  to care for you!   -- [name] [facility]

## 2024-03-12 NOTE — Progress Notes (Signed)
 Cardiology Office Note  Date: 03/12/2024   ID: Robyn Ashley, DOB [birth year], MRN [id number]  PCP:  Bertell Satterfield, MD  Cardiologist:  Diannah SHAUNNA Maywood, MD Electrophysiologist:  None   History of Present Illness: Robyn Ashley is a 73 y.o. female  CT cardiac showed coronary calcium  score of 20.8, 53rd percentile for age and sex matched control, total plaque volume is moderate and mild nonobstructive CAD is present.  Echocardiogram showed normal LV function, G1 DD with elevated left atrial pressure, normal RV function, no valvular heart disease and CVP 3 mmHg.  As she had diastolic dysfunction, she was started on Farxiga  10 mg once daily but she did not notice any improvement in her symptoms.  She had leg swelling couple of months ago due to which she was started on p.o. Lasix  20 mg once daily that improved her symptoms.  Ongoing chronic stable DOE for 1 year but worsening in the last few months to the point she is not able to walk from room to room within the house without getting short of breath.  No orthopnea, PND but she had leg swelling couple of months ago due to which she was started on p.o. Lasix  20 mg once a day that resolved swelling.  No dizziness, syncope, palpitations.  No angina.  Past Medical History:  Diagnosis Date   Allergy    Anemia    Anginal pain (HCC) 2015   Depression    Dyslipidemia 05/10/2020   Dysrhythmia 2015   SVT   Essential hypertension 05/10/2020   GERD (gastroesophageal reflux disease)    Hiatal hernia    History of degenerative disc disease    History of nuclear stress test 07/27/2013   in care everywhere per cardiology note by dr norman:  test normal   Hyperlipidemia    Hypertension    Hypothyroidism    Hypothyroidism (acquired) 05/10/2020   Intermittent palpitations    MVP (mitral valve prolapse)    per pt dx 1980s,  last echo done 2016 by dr norman [facility] cardiology in [place]) and last cardiology visit 11-02-2014 in epic;   01-27-2019 per  pt episodic palpitations and takes toprol , currently followed by pcp   Narcolepsy    OSA (obstructive sleep apnea)    Not on CPAP   Osteoporosis    osteopenia   TMJ arthralgia     Past Surgical History:  Procedure Laterality Date   ABDOMINAL HYSTERECTOMY     BIOPSY  06/24/2018   Procedure: BIOPSY;  Surgeon: Shaaron Lamar HERO, MD;  [facility];  Service: Endoscopy;;  (gastric) (colon)   BREAST CYST ASPIRATION     COLONOSCOPY N/A 06/24/2018   Procedure: COLONOSCOPY;  Surgeon: Shaaron Lamar HERO, MD;  [facility];  Service: Endoscopy;  Laterality: N/A;  1:45pm   ESOPHAGEAL DILATION  09/02/2019   Procedure: ESOPHAGEAL DILATION;  Surgeon: Shila Gustav GAILS, MD;  [facility]: [facility];  Service: Endoscopy;;   ESOPHAGEAL MANOMETRY N/A 09/02/2019   Procedure: ESOPHAGEAL MANOMETRY (EM);  Surgeon: Shila Gustav GAILS, MD;  [facility]: [facility];  Service: Endoscopy;  Laterality: N/A;   ESOPHAGEAL MANOMETRY N/A 02/06/2021   Procedure: ESOPHAGEAL MANOMETRY (EM);  Surgeon: Shila Gustav GAILS, MD;  [facility];  Service: Endoscopy;  Laterality: N/A;   ESOPHAGOGASTRODUODENOSCOPY N/A 06/24/2018   Procedure: ESOPHAGOGASTRODUODENOSCOPY (EGD);  Surgeon: Shaaron Lamar HERO, MD;  [facility];  Service: Endoscopy;  Laterality: N/A;   ESOPHAGOGASTRODUODENOSCOPY N/A 07/14/2019   Procedure: ESOPHAGOGASTRODUODENOSCOPY (EGD);  Surgeon:  Rourk, Lamar HERO, MD;  [facility];  Service: Endoscopy;  Laterality: N/A;  3:00pm   ESOPHAGOGASTRODUODENOSCOPY (EGD) WITH PROPOFOL  N/A 09/02/2019   Procedure: ESOPHAGOGASTRODUODENOSCOPY (EGD) WITH PROPOFOL ;  Surgeon: Shila Gustav GAILS, MD;  [facility]: [facility];  Service: Endoscopy;  Laterality: N/A;   mano probe placement   EYE SURGERY Bilateral    rk   IR GASTROSTOMY TUBE MOD SED  05/10/2020   IR RADIOLOGIST EVAL & MGMT  05/26/2020   MALONEY DILATION N/A 06/24/2018   Procedure: AGAPITO DILATION;  Surgeon: Shaaron Lamar HERO,  MD;  [facility];  Service: Endoscopy;  Laterality: N/A;   MALONEY DILATION N/A 07/14/2019   Procedure: AGAPITO DILATION;  Surgeon: Shaaron Lamar HERO, MD;  [facility];  Service: Endoscopy;  Laterality: N/A;   NISSEN FUNDOPLICATION  09/2019   takedown   POLYPECTOMY  06/24/2018   Procedure: POLYPECTOMY;  Surgeon: Shaaron Lamar HERO, MD;  [facility];  Service: Endoscopy;;  colon    RF ablation of cervical nerves     ROTATOR CUFF REPAIR Right 11-21-2006  dr supple @[facility]     Current Outpatient Medications  Medication Sig Dispense Refill   acetaminophen  (TYLENOL ) 500 MG tablet Take 500 mg by mouth every 8 (eight) hours as needed for mild pain (pain score 1-3).     albuterol  (ACCUNEB ) 1.25 MG/3ML nebulizer solution Take 3 mLs (1.25 mg total) by nebulization every 6 (six) hours as needed for wheezing or shortness of breath. 75 mL 0   albuterol  (PROVENTIL ) 2 MG tablet Take 2 mg by mouth 3 (three) times daily.     albuterol  (VENTOLIN  HFA) 108 (90 Base) MCG/ACT inhaler Inhale 2 puffs into the lungs every 4 (four) hours as needed for shortness of breath or wheezing.     atorvastatin  (LIPITOR) 20 MG tablet Take 20 mg by mouth at bedtime.      budesonide -formoterol  (SYMBICORT ) 80-4.5 MCG/ACT inhaler Inhale 2 puffs into the lungs.     calcium  carbonate (OSCAL) 1500 (600 Ca) MG TABS tablet Take 600 mg by mouth with breakfast.     D3 HIGH POTENCY 25 MCG (1000 UT) capsule Take 1,000 Units by mouth daily.     dapagliflozin  propanediol (FARXIGA ) 10 MG TABS tablet Take 1 tablet (10 mg total) by mouth daily before breakfast. 30 tablet 11   E-400 180 MG (400 UNIT) CAPS Take 1 capsule by mouth at bedtime.     FLUoxetine  (PROZAC ) 20 MG capsule Take 20 mg by mouth daily.     fluticasone  furoate-vilanterol (BREO ELLIPTA ) 100-25 MCG/ACT AEPB Inhale 1 puff into the lungs daily. 28 each 0   furosemide  (LASIX ) 20 MG tablet Take 20 mg by mouth daily.     levothyroxine  (SYNTHROID ,  LEVOTHROID) 50 MCG tablet Take 50 mcg by mouth daily before breakfast.   2   losartan  (COZAAR ) 50 MG tablet Take 1 tablet (50 mg total) by mouth daily. 30 tablet 0   MAGNESIUM  CITRATE PO Take 1 tablet by mouth daily.     metoprolol  succinate (TOPROL -XL) 50 MG 24 hr tablet Take 50 mg by mouth at bedtime.   10   mirtazapine  (REMERON ) 30 MG tablet Take 30 mg by mouth daily.     omeprazole (PRILOSEC) 20 MG capsule Take 20 mg by mouth at bedtime.     No current facility-administered medications for this visit.   Allergies:  Norvasc [amlodipine besylate], Tetracyclines & related, Aspirin, Dexilant  [dexlansoprazole ], and Nsaids   Social History: The patient  reports that she quit smoking about 26 years ago. Her smoking use included cigarettes. She started smoking about 55 years ago. She has a 43.5 pack-year smoking history. She has never used smokeless tobacco. She reports that she does not currently use alcohol. She reports that she does not use drugs.   Family History: The patient's family history includes Alzheimer's disease in her mother; Breast cancer in her maternal grandmother; COPD in her father; Colon cancer in her maternal grandfather and paternal grandmother; Heart disease in her father; Liver cancer in her brother.   ROS:  Please see the history of present illness. Otherwise, complete review of systems is positive for none.  All other systems are reviewed and negative.   Physical Exam: VS:  Ht 5' 1.5 (1.562 m)   Wt 172 lb 9.6 oz (78.3 kg)   BMI 32.08 kg/m , BMI Body mass index is 32.08 kg/m.  Wt Readings from Last 3 Encounters:  03/12/24 172 lb 9.6 oz (78.3 kg)  11/24/23 165 lb 5.5 oz (75 kg)  11/20/23 165 lb (74.8 kg)    General: Patient appears comfortable at rest. HEENT: Conjunctiva and lids normal, oropharynx clear with moist mucosa. Neck: Supple, no elevated JVP or carotid bruits, no thyromegaly. Lungs: Clear to auscultation, nonlabored breathing at rest. Cardiac: Regular  rate and rhythm, no S3 or significant systolic murmur, no pericardial rub. Abdomen: Soft, nontender, no hepatomegaly, bowel sounds present, no guarding or rebound. Extremities: No pitting edema, distal pulses 2+. Skin: Warm and dry. Musculoskeletal: No kyphosis. Neuropsychiatric: Alert and oriented x3, affect grossly appropriate.  Recent Labwork: 11/24/2023: B Natriuretic Peptide 41.0 11/25/2023: Hemoglobin 10.6; Magnesium  1.7; Platelets 318 01/24/2024: BUN 18; Creat 0.91; Potassium 4.0; Sodium 142  No results found for: CHOL, TRIG, HDL, CHOLHDL, VLDL, LDLCALC, LDLDIRECT   Assessment and Plan:  DOE: Ongoing DOE for the last 1 year, recently worsening.  Exercise intolerance present.  Not able to do household chores.  Recently developed leg swelling that resolved with p.o. Lasix  20 mg once daily.  CT cardiac showed mild nonobstructive CAD.  Echocardiogram showed normal LV/RV function, G1 DD with elevated left atrial pressure but CVP 3 mmHg and no valvular heart disease.  TR jet was inadequate to his calculate PASP.  PFTs from 2020 reviewed, positive bronchodilator response and minimal obstruction noted.  Symptoms out of proportion to lung disease but she is getting her PFTs updated.  Will schedule her for right heart catheterization to rule out any pulmonary hypertension.  Informed consent for RHC Risks and benefits of cardiac catheterization have been discussed with the patient.  These include bleeding, infection.  The patient understands these risks and is willing to proceed.   HTN, controlled: Continue metoprolol  succinate 50 mg daily at bedtime and losartan  50 mg once daily.  Continue p.o. Lasix  20 mg once daily.  HLD, unknown values: Continue atorvastatin  20 mg nightly.  Goal LDL less than 100.  Follows with PCP.  I spent 30 minutes reviewing the prior records, reports, test, more than 3 labs, discussion of the procedure, DOE and documentation.  Medication Adjustments/Labs  and Tests Ordered: Current medicines are reviewed at length with the patient today.  Concerns regarding medicines are outlined above.    Disposition:  Follow up 1 month after RHC with APP/MD  Signed, Robyn Ashley Arleta Maywood, MD, 03/12/2024 9:29 AM    [facility] at [facility] [address], [place], [place] [address]

## 2024-03-12 NOTE — H&P (View-Only) (Signed)
 Cardiology Office Note  Date: 03/12/2024   ID: Robyn Ashley, DOB 1950/12/24, MRN [id number]  PCP:  Bertell Satterfield, MD  Cardiologist:  Diannah SHAUNNA Maywood, MD Electrophysiologist:  None   History of Present Illness: Robyn Ashley is a 73 y.o. female  CT cardiac showed coronary calcium  score of 20.8, 53rd percentile for age and sex matched control, total plaque volume is moderate and mild nonobstructive CAD is present.  Echocardiogram showed normal LV function, G1 DD with elevated left atrial pressure, normal RV function, no valvular heart disease and CVP 3 mmHg.  As she had diastolic dysfunction, she was started on Farxiga  10 mg once daily but she did not notice any improvement in her symptoms.  She had leg swelling couple of months ago due to which she was started on p.o. Lasix  20 mg once daily that improved her symptoms.  Ongoing chronic stable DOE for 1 year but worsening in the last few months to the point she is not able to walk from room to room within the house without getting short of breath.  No orthopnea, PND but she had leg swelling couple of months ago due to which she was started on p.o. Lasix  20 mg once a day that resolved swelling.  No dizziness, syncope, palpitations.  No angina.  Past Medical History:  Diagnosis Date   Allergy    Anemia    Anginal pain (HCC) 2015   Depression    Dyslipidemia 05/10/2020   Dysrhythmia 2015   SVT   Essential hypertension 05/10/2020   GERD (gastroesophageal reflux disease)    Hiatal hernia    History of degenerative disc disease    History of nuclear stress test 07/27/2013   in care everywhere per cardiology note by dr norman:  test normal   Hyperlipidemia    Hypertension    Hypothyroidism    Hypothyroidism (acquired) 05/10/2020   Intermittent palpitations    MVP (mitral valve prolapse)    per pt dx 1980s,  last echo done 2016 by dr norman [facility] cardiology in [place]) and last cardiology visit 11-02-2014 in epic;   01-27-2019 per  pt episodic palpitations and takes toprol , currently followed by pcp   Narcolepsy    OSA (obstructive sleep apnea)    Not on CPAP   Osteoporosis    osteopenia   TMJ arthralgia     Past Surgical History:  Procedure Laterality Date   ABDOMINAL HYSTERECTOMY     BIOPSY  06/24/2018   Procedure: BIOPSY;  Surgeon: Shaaron Lamar HERO, MD;  [facility];  Service: Endoscopy;;  (gastric) (colon)   BREAST CYST ASPIRATION     COLONOSCOPY N/A 06/24/2018   Procedure: COLONOSCOPY;  Surgeon: Shaaron Lamar HERO, MD;  [facility];  Service: Endoscopy;  Laterality: N/A;  1:45pm   ESOPHAGEAL DILATION  09/02/2019   Procedure: ESOPHAGEAL DILATION;  Surgeon: Shila Gustav GAILS, MD;  [facility];  Service: Endoscopy;;   ESOPHAGEAL MANOMETRY N/A 09/02/2019   Procedure: ESOPHAGEAL MANOMETRY (EM);  Surgeon: Shila Gustav GAILS, MD;  [facility];  Service: Endoscopy;  Laterality: N/A;   ESOPHAGEAL MANOMETRY N/A 02/06/2021   Procedure: ESOPHAGEAL MANOMETRY (EM);  Surgeon: Shila Gustav GAILS, MD;  [facility];  Service: Endoscopy;  Laterality: N/A;   ESOPHAGOGASTRODUODENOSCOPY N/A 06/24/2018   Procedure: ESOPHAGOGASTRODUODENOSCOPY (EGD);  Surgeon: Shaaron Lamar HERO, MD;  [facility];  Service: Endoscopy;  Laterality: N/A;   ESOPHAGOGASTRODUODENOSCOPY N/A 07/14/2019   Procedure: ESOPHAGOGASTRODUODENOSCOPY (EGD);  Surgeon:  Rourk, Lamar HERO, MD;  [facility];  Service: Endoscopy;  Laterality: N/A;  3:00pm   ESOPHAGOGASTRODUODENOSCOPY (EGD) WITH PROPOFOL  N/A 09/02/2019   Procedure: ESOPHAGOGASTRODUODENOSCOPY (EGD) WITH PROPOFOL ;  Surgeon: Shila Gustav GAILS, MD;  [facility];  Service: Endoscopy;  Laterality: N/A;   mano probe placement   EYE SURGERY Bilateral    rk   IR GASTROSTOMY TUBE MOD SED  05/10/2020   IR RADIOLOGIST EVAL & MGMT  05/26/2020   MALONEY DILATION N/A 06/24/2018   Procedure: AGAPITO DILATION;  Surgeon: Shaaron Lamar HERO,  MD;  [facility];  Service: Endoscopy;  Laterality: N/A;   MALONEY DILATION N/A 07/14/2019   Procedure: AGAPITO DILATION;  Surgeon: Shaaron Lamar HERO, MD;  [facility];  Service: Endoscopy;  Laterality: N/A;   NISSEN FUNDOPLICATION  09/2019   takedown   POLYPECTOMY  06/24/2018   Procedure: POLYPECTOMY;  Surgeon: Shaaron Lamar HERO, MD;  [facility];  Service: Endoscopy;;  colon    RF ablation of cervical nerves     ROTATOR CUFF REPAIR Right 11-21-2006  dr supple @[facility]     Current Outpatient Medications  Medication Sig Dispense Refill   acetaminophen  (TYLENOL ) 500 MG tablet Take 500 mg by mouth every 8 (eight) hours as needed for mild pain (pain score 1-3).     albuterol  (ACCUNEB ) 1.25 MG/3ML nebulizer solution Take 3 mLs (1.25 mg total) by nebulization every 6 (six) hours as needed for wheezing or shortness of breath. 75 mL 0   albuterol  (PROVENTIL ) 2 MG tablet Take 2 mg by mouth 3 (three) times daily.     albuterol  (VENTOLIN  HFA) 108 (90 Base) MCG/ACT inhaler Inhale 2 puffs into the lungs every 4 (four) hours as needed for shortness of breath or wheezing.     atorvastatin  (LIPITOR) 20 MG tablet Take 20 mg by mouth at bedtime.      budesonide -formoterol  (SYMBICORT ) 80-4.5 MCG/ACT inhaler Inhale 2 puffs into the lungs.     calcium  carbonate (OSCAL) 1500 (600 Ca) MG TABS tablet Take 600 mg by mouth with breakfast.     D3 HIGH POTENCY 25 MCG (1000 UT) capsule Take 1,000 Units by mouth daily.     dapagliflozin  propanediol (FARXIGA ) 10 MG TABS tablet Take 1 tablet (10 mg total) by mouth daily before breakfast. 30 tablet 11   E-400 180 MG (400 UNIT) CAPS Take 1 capsule by mouth at bedtime.     FLUoxetine  (PROZAC ) 20 MG capsule Take 20 mg by mouth daily.     fluticasone  furoate-vilanterol (BREO ELLIPTA ) 100-25 MCG/ACT AEPB Inhale 1 puff into the lungs daily. 28 each 0   furosemide  (LASIX ) 20 MG tablet Take 20 mg by mouth daily.     levothyroxine  (SYNTHROID ,  LEVOTHROID) 50 MCG tablet Take 50 mcg by mouth daily before breakfast.   2   losartan  (COZAAR ) 50 MG tablet Take 1 tablet (50 mg total) by mouth daily. 30 tablet 0   MAGNESIUM  CITRATE PO Take 1 tablet by mouth daily.     metoprolol  succinate (TOPROL -XL) 50 MG 24 hr tablet Take 50 mg by mouth at bedtime.   10   mirtazapine  (REMERON ) 30 MG tablet Take 30 mg by mouth daily.     omeprazole (PRILOSEC) 20 MG capsule Take 20 mg by mouth at bedtime.     No current facility-administered medications for this visit.   Allergies:  Norvasc [amlodipine besylate], Tetracyclines & related, Aspirin, Dexilant  [dexlansoprazole ], and Nsaids   Social History: The patient  reports that she quit smoking about 26 years ago. Her smoking use included cigarettes. She started smoking about 55 years ago. She has a 43.5 pack-year smoking history. She has never used smokeless tobacco. She reports that she does not currently use alcohol. She reports that she does not use drugs.   Family History: The patient's family history includes Alzheimer's disease in her mother; Breast cancer in her maternal grandmother; COPD in her father; Colon cancer in her maternal grandfather and paternal grandmother; Heart disease in her father; Liver cancer in her brother.   ROS:  Please see the history of present illness. Otherwise, complete review of systems is positive for none.  All other systems are reviewed and negative.   Physical Exam: VS:  Ht 5' 1.5 (1.562 m)   Wt 172 lb 9.6 oz (78.3 kg)   BMI 32.08 kg/m , BMI Body mass index is 32.08 kg/m.  Wt Readings from Last 3 Encounters:  03/12/24 172 lb 9.6 oz (78.3 kg)  11/24/23 165 lb 5.5 oz (75 kg)  11/20/23 165 lb (74.8 kg)    General: Patient appears comfortable at rest. HEENT: Conjunctiva and lids normal, oropharynx clear with moist mucosa. Neck: Supple, no elevated JVP or carotid bruits, no thyromegaly. Lungs: Clear to auscultation, nonlabored breathing at rest. Cardiac: Regular  rate and rhythm, no S3 or significant systolic murmur, no pericardial rub. Abdomen: Soft, nontender, no hepatomegaly, bowel sounds present, no guarding or rebound. Extremities: No pitting edema, distal pulses 2+. Skin: Warm and dry. Musculoskeletal: No kyphosis. Neuropsychiatric: Alert and oriented x3, affect grossly appropriate.  Recent Labwork: 11/24/2023: B Natriuretic Peptide 41.0 11/25/2023: Hemoglobin 10.6; Magnesium  1.7; Platelets 318 01/24/2024: BUN 18; Creat 0.91; Potassium 4.0; Sodium 142  No results found for: CHOL, TRIG, HDL, CHOLHDL, VLDL, LDLCALC, LDLDIRECT   Assessment and Plan:  DOE: Ongoing DOE for the last 1 year, recently worsening.  Exercise intolerance present.  Not able to do household chores.  Recently developed leg swelling that resolved with p.o. Lasix  20 mg once daily.  CT cardiac showed mild nonobstructive CAD.  Echocardiogram showed normal LV/RV function, G1 DD with elevated left atrial pressure but CVP 3 mmHg and no valvular heart disease.  TR jet was inadequate to his calculate PASP.  PFTs from 2020 reviewed, positive bronchodilator response and minimal obstruction noted.  Symptoms out of proportion to lung disease but she is getting her PFTs updated.  Will schedule her for right heart catheterization to rule out any pulmonary hypertension.  Informed consent for RHC Risks and benefits of cardiac catheterization have been discussed with the patient.  These include bleeding, infection.  The patient understands these risks and is willing to proceed.   HTN, controlled: Continue metoprolol  succinate 50 mg daily at bedtime and losartan  50 mg once daily.  Continue p.o. Lasix  20 mg once daily.  HLD, unknown values: Continue atorvastatin  20 mg nightly.  Goal LDL less than 100.  Follows with PCP.  I spent 30 minutes reviewing the prior records, reports, test, more than 3 labs, discussion of the procedure, DOE and documentation.  Medication Adjustments/Labs  and Tests Ordered: Current medicines are reviewed at length with the patient today.  Concerns regarding medicines are outlined above.    Disposition:  Follow up 1 month after RHC with APP/MD  Signed, Maude Gloor Arleta Maywood, MD, 03/12/2024 9:29 AM    [facility] at [facility] [address] [place], [place] [address]

## 2024-03-16 ENCOUNTER — Encounter (HOSPITAL_COMMUNITY)
Admission: RE | Disposition: A | Discharge status: Home / Self Care | Payer: Self-pay | Source: Home / Self Care | Attending: Cardiovascular Disease

## 2024-03-16 ENCOUNTER — Encounter (HOSPITAL_COMMUNITY): Payer: Self-pay | Admitting: Cardiovascular Disease

## 2024-03-16 ENCOUNTER — Other Ambulatory Visit: Payer: Self-pay

## 2024-03-16 ENCOUNTER — Ambulatory Visit (HOSPITAL_COMMUNITY)
Admission: RE | Admit: 2024-03-16 | Discharge: 2024-03-16 | Disposition: A | Discharge status: Home / Self Care | Attending: Cardiovascular Disease | Admitting: Cardiovascular Disease

## 2024-03-16 DIAGNOSIS — R0602 Shortness of breath: Secondary | ICD-10-CM | POA: Insufficient documentation

## 2024-03-16 DIAGNOSIS — I10 Essential (primary) hypertension: Secondary | ICD-10-CM | POA: Insufficient documentation

## 2024-03-16 DIAGNOSIS — Z79899 Other long term (current) drug therapy: Secondary | ICD-10-CM | POA: Diagnosis not present

## 2024-03-16 DIAGNOSIS — I2510 Atherosclerotic heart disease of native coronary artery without angina pectoris: Secondary | ICD-10-CM | POA: Insufficient documentation

## 2024-03-16 DIAGNOSIS — R0600 Dyspnea, unspecified: Secondary | ICD-10-CM

## 2024-03-16 DIAGNOSIS — E785 Hyperlipidemia, unspecified: Secondary | ICD-10-CM | POA: Diagnosis not present

## 2024-03-16 HISTORY — PX: RIGHT HEART CATH: CATH118263

## 2024-03-16 LAB — POCT I-STAT EG7
Acid-Base Excess: 0 mmol/L (ref 0.0–2.0)
Acid-Base Excess: 2 mmol/L (ref 0.0–2.0)
Bicarbonate: 25.6 mmol/L (ref 20.0–28.0)
Bicarbonate: 27.2 mmol/L (ref 20.0–28.0)
Calcium, Ion: 1.13 mmol/L — ABNORMAL LOW (ref 1.15–1.40)
Calcium, Ion: 1.15 mmol/L (ref 1.15–1.40)
HCT: 30 % — ABNORMAL LOW (ref 36.0–46.0)
HCT: 30 % — ABNORMAL LOW (ref 36.0–46.0)
Hemoglobin: 10.2 g/dL — ABNORMAL LOW (ref 12.0–15.0)
Hemoglobin: 10.2 g/dL — ABNORMAL LOW (ref 12.0–15.0)
O2 Saturation: 62 %
O2 Saturation: 68 %
Potassium: 3.9 mmol/L (ref 3.5–5.1)
Potassium: 3.9 mmol/L (ref 3.5–5.1)
Sodium: 138 mmol/L (ref 135–145)
Sodium: 138 mmol/L (ref 135–145)
TCO2: 27 mmol/L (ref 22–32)
TCO2: 29 mmol/L (ref 22–32)
pCO2, Ven: 43.4 mmHg — ABNORMAL LOW (ref 44–60)
pCO2, Ven: 45.3 mmHg (ref 44–60)
pH, Ven: 7.379 (ref 7.25–7.43)
pH, Ven: 7.386 (ref 7.25–7.43)
pO2, Ven: 33 mmHg (ref 32–45)
pO2, Ven: 36 mmHg (ref 32–45)

## 2024-03-16 SURGERY — RIGHT HEART CATH
Anesthesia: LOCAL

## 2024-03-16 MED ORDER — HYDRALAZINE HCL 20 MG/ML IJ SOLN: 10.0000 mg | INTRAMUSCULAR | Status: DC | PRN

## 2024-03-16 MED ORDER — ONDANSETRON HCL 4 MG/2ML IJ SOLN: 4.0000 mg | Freq: Four times a day (QID) | INTRAMUSCULAR | Status: DC | PRN

## 2024-03-16 MED ORDER — LIDOCAINE HCL (PF) 1 % IJ SOLN
INTRAMUSCULAR | Status: DC | PRN
  Administered 2024-03-16: 2 mL

## 2024-03-16 MED ORDER — SODIUM CHLORIDE 0.9 % IV SOLN: 250.0000 mL | INTRAVENOUS | Status: DC | PRN

## 2024-03-16 MED ORDER — LIDOCAINE HCL (PF) 1 % IJ SOLN
INTRAMUSCULAR | Status: AC
Start: 2024-03-16 — End: 2024-03-16
  Filled 2024-03-16: qty 30

## 2024-03-16 MED ORDER — FREE WATER: 500.0000 mL | Freq: Once | Status: DC

## 2024-03-16 MED ORDER — HEPARIN (PORCINE) IN NACL 1000-0.9 UT/500ML-% IV SOLN
INTRAVENOUS | Status: DC | PRN
  Administered 2024-03-16: 500 mL

## 2024-03-16 MED ORDER — SODIUM CHLORIDE 0.9% FLUSH: 3.0000 mL | Freq: Two times a day (BID) | INTRAVENOUS | Status: DC

## 2024-03-16 MED ORDER — SODIUM CHLORIDE 0.9% FLUSH: 3.0000 mL | INTRAVENOUS | Status: DC | PRN

## 2024-03-16 MED ORDER — LABETALOL HCL 5 MG/ML IV SOLN: 10.0000 mg | INTRAVENOUS | Status: DC | PRN

## 2024-03-16 MED ORDER — ACETAMINOPHEN 325 MG PO TABS: 650.0000 mg | ORAL_TABLET | ORAL | Status: DC | PRN

## 2024-03-16 SURGICAL SUPPLY — 5 items
CATH BALLN WEDGE 5F 110CM (CATHETERS) IMPLANT
PACK CARDIAC CATHETERIZATION (CUSTOM PROCEDURE TRAY) IMPLANT
SHEATH GLIDE SLENDER 4/5FR (SHEATH) IMPLANT
TRANSDUCER W/STOPCOCK (MISCELLANEOUS) IMPLANT
TUBING ART PRESS 72 MALE/FEM (TUBING) IMPLANT

## 2024-03-16 NOTE — Discharge Instructions (Signed)

## 2024-03-16 NOTE — Interval H&P Note (Signed)
 History and Physical Interval Note:  03/16/2024 9:17 AM  Robyn Ashley  has presented today for surgery, with the diagnosis of exercise intolerance.  The various methods of treatment have been discussed with the patient and family. After consideration of risks, benefits and other options for treatment, the patient has consented to  Procedure(s): RIGHT HEART CATH (N/A) as a surgical intervention.  The patient's history has been reviewed, patient examined, no change in status, stable for surgery.  I have reviewed the patient's chart and labs.  Questions were answered to the patient's satisfaction.     Lonni Cash

## 2024-03-16 NOTE — Progress Notes (Signed)
 PT ambulated to the bathroom where she was able to void without difficulty, discharge instructions reviewed with patient and family at bedside, Denies questions or concerns at this time. No s/s of complications at incision site. IV removed. Personal belonging returned to patient. PT escorted from the unit via wheelchair to personal vehicle.

## 2024-03-18 DIAGNOSIS — R5381 Other malaise: Secondary | ICD-10-CM | POA: Diagnosis not present

## 2024-03-18 DIAGNOSIS — R2689 Other abnormalities of gait and mobility: Secondary | ICD-10-CM | POA: Diagnosis not present

## 2024-03-18 DIAGNOSIS — M6281 Muscle weakness (generalized): Secondary | ICD-10-CM | POA: Diagnosis not present

## 2024-03-20 DIAGNOSIS — R5381 Other malaise: Secondary | ICD-10-CM | POA: Diagnosis not present

## 2024-03-20 DIAGNOSIS — R2689 Other abnormalities of gait and mobility: Secondary | ICD-10-CM | POA: Diagnosis not present

## 2024-03-20 DIAGNOSIS — M6281 Muscle weakness (generalized): Secondary | ICD-10-CM | POA: Diagnosis not present

## 2024-04-13 ENCOUNTER — Encounter: Payer: Self-pay | Admitting: Pulmonary Disease

## 2024-04-13 ENCOUNTER — Ambulatory Visit: Admitting: Pulmonary Disease

## 2024-04-13 VITALS — BP 142/66 | HR 78 | Temp 97.8°F | Ht 61.0 in | Wt 171.0 lb

## 2024-04-13 DIAGNOSIS — J441 Chronic obstructive pulmonary disease with (acute) exacerbation: Secondary | ICD-10-CM

## 2024-04-13 DIAGNOSIS — Z87891 Personal history of nicotine dependence: Secondary | ICD-10-CM | POA: Diagnosis not present

## 2024-04-13 DIAGNOSIS — J449 Chronic obstructive pulmonary disease, unspecified: Secondary | ICD-10-CM

## 2024-04-13 DIAGNOSIS — G4733 Obstructive sleep apnea (adult) (pediatric): Secondary | ICD-10-CM | POA: Diagnosis not present

## 2024-04-13 MED ORDER — BUDESONIDE-FORMOTEROL FUMARATE 160-4.5 MCG/ACT IN AERO: 2.0000 | INHALATION_SPRAY | Freq: Two times a day (BID) | RESPIRATORY_TRACT | 6 refills | Status: DC

## 2024-04-13 MED ORDER — PREDNISONE 20 MG PO TABS
20.0000 mg | ORAL_TABLET | Freq: Every day | ORAL | 0 refills | Status: DC
Start: 2024-04-13 — End: 2024-04-23

## 2024-04-13 MED ORDER — AZITHROMYCIN 250 MG PO TABS: ORAL_TABLET | ORAL | 0 refills | Status: DC

## 2024-04-13 NOTE — Progress Notes (Addendum)
 Robyn Ashley    [id number]    09/15/1950  Primary Care Physician:Ashley, [name], MD  Referring Physician: Bertell [name], MD [address] [place],  [place] [address]  Chief complaint:   Patient being seen for worsening shortness of breath Recent hospitalization for COPD exacerbation  HPI:  Past history of COPD asthma overlap History of obstructive sleep apnea  Was using CPAP previously but got away from using CPAP because of other health problems Did have a feeding tube that was removed-when she was having significant issues with this was when she stopped using CPAP Weight has gone up a little bit Last use CPAP about 3 years ago Does not have any problems tolerating CPAP  Admits to dryness of the mouth in the morning Occasional morning headaches Feels okay when she wakes up in the morning Memory is fair  Quit smoking over 20 years ago Was told about COPD asthma previously  Has been coughing more short of breath recently Has great grandkids around with respiratory complaints  No pertinent occupational history  Outpatient Encounter Medications as of 04/13/2024  Medication Sig   acetaminophen  (TYLENOL ) 500 MG tablet Take 500 mg by mouth every 8 (eight) hours as needed for mild pain (pain score 1-3).   albuterol  (ACCUNEB ) 1.25 MG/3ML nebulizer solution Take 3 mLs (1.25 mg total) by nebulization every 6 (six) hours as needed for wheezing or shortness of breath.   albuterol  (PROVENTIL ) 2 MG tablet Take 2 mg by mouth 3 (three) times daily.   albuterol  (VENTOLIN  HFA) 108 (90 Base) MCG/ACT inhaler Inhale 2 puffs into the lungs every 4 (four) hours as needed for shortness of breath or wheezing.   atorvastatin  (LIPITOR) 20 MG tablet Take 20 mg by mouth at bedtime.    azithromycin  (ZITHROMAX  Z-PAK) 250 MG tablet Take 2 tablets day 1 and then 1 daily for 4 days   budesonide -formoterol  (SYMBICORT ) 160-4.5 MCG/ACT inhaler Inhale 2 puffs into the lungs every 12  (twelve) hours.   calcium  carbonate (OSCAL) 1500 (600 Ca) MG TABS tablet Take 600 mg by mouth with breakfast.   D3 HIGH POTENCY 25 MCG (1000 UT) capsule Take 1,000 Units by mouth daily.   dapagliflozin  propanediol (FARXIGA ) 10 MG TABS tablet Take 1 tablet (10 mg total) by mouth daily before breakfast.   E-400 180 MG (400 UNIT) CAPS Take 1 capsule by mouth at bedtime.   FLUoxetine  (PROZAC ) 20 MG capsule Take 20 mg by mouth daily.   furosemide  (LASIX ) 20 MG tablet Take 20 mg by mouth daily.   levothyroxine  (SYNTHROID , LEVOTHROID) 50 MCG tablet Take 50 mcg by mouth daily before breakfast.    losartan  (COZAAR ) 50 MG tablet Take 1 tablet (50 mg total) by mouth daily.   MAGNESIUM  CITRATE PO Take 1 tablet by mouth daily.   metoprolol  succinate (TOPROL -XL) 50 MG 24 hr tablet Take 50 mg by mouth at bedtime.    mirtazapine  (REMERON ) 30 MG tablet Take 30 mg by mouth daily.   omeprazole (PRILOSEC) 20 MG capsule Take 20 mg by mouth at bedtime.   predniSONE  (DELTASONE ) 20 MG tablet Take 1 tablet (20 mg total) by mouth daily with breakfast.   Pseudoephedrine-Acetaminophen  (SINUS PO) Take 1 tablet by mouth daily as needed (Allergies).   [DISCONTINUED] budesonide -formoterol  (SYMBICORT ) 80-4.5 MCG/ACT inhaler Inhale 2 puffs into the lungs.   No facility-administered encounter medications on file as of 04/13/2024.    Allergies as of 04/13/2024 - Review Complete 04/13/2024  Allergen Reaction Noted   Norvasc [amlodipine besylate] Other (See Comments) 05/07/2018   Tetracyclines & related Other (See Comments) 01/10/2011   Aspirin Other (See Comments) 05/07/2018   Dexilant  [dexlansoprazole ] Diarrhea 08/20/2018   Nsaids Other (See Comments) 05/07/2018    Past Medical History:  Diagnosis Date   Allergy    Anemia    Anginal pain (HCC) 2015   Depression    Dyslipidemia 05/10/2020   Dysrhythmia 2015   SVT   Essential hypertension 05/10/2020   GERD (gastroesophageal reflux disease)    Hiatal hernia     History of degenerative disc disease    History of nuclear stress test 07/27/2013   in care everywhere per cardiology note by dr norman:  test normal   Hyperlipidemia    Hypertension    Hypothyroidism    Hypothyroidism (acquired) 05/10/2020   Intermittent palpitations    MVP (mitral valve prolapse)    per pt dx 1980s,  last echo done 2016 by dr norman [facility] cardiology in [place]) and last cardiology visit 11-02-2014 in epic;   01-27-2019 per pt episodic palpitations and takes toprol , currently followed by pcp   Narcolepsy    OSA (obstructive sleep apnea)    Not on CPAP   Osteoporosis    osteopenia   TMJ arthralgia     Past Surgical History:  Procedure Laterality Date   ABDOMINAL HYSTERECTOMY     BIOPSY  06/24/2018   Procedure: BIOPSY;  Surgeon: Shaaron Lamar HERO, MD;  [facility];  Service: Endoscopy;;  (gastric) (colon)   BREAST CYST ASPIRATION     COLONOSCOPY N/A 06/24/2018   Procedure: COLONOSCOPY;  Surgeon: Shaaron Lamar HERO, MD;  [facility];  Service: Endoscopy;  Laterality: N/A;  1:45pm   ESOPHAGEAL DILATION  09/02/2019   Procedure: ESOPHAGEAL DILATION;  Surgeon: Shila Gustav GAILS, MD;  [facility];  Service: Endoscopy;;   ESOPHAGEAL MANOMETRY N/A 09/02/2019   Procedure: ESOPHAGEAL MANOMETRY (EM);  Surgeon: Shila Gustav GAILS, MD;  [facility];  Service: Endoscopy;  Laterality: N/A;   ESOPHAGEAL MANOMETRY N/A 02/06/2021   Procedure: ESOPHAGEAL MANOMETRY (EM);  Surgeon: Shila Gustav GAILS, MD;  [facility];  Service: Endoscopy;  Laterality: N/A;   ESOPHAGOGASTRODUODENOSCOPY N/A 06/24/2018   Procedure: ESOPHAGOGASTRODUODENOSCOPY (EGD);  Surgeon: Shaaron Lamar HERO, MD;  [facility];  Service: Endoscopy;  Laterality: N/A;   ESOPHAGOGASTRODUODENOSCOPY N/A 07/14/2019   Procedure: ESOPHAGOGASTRODUODENOSCOPY (EGD);  Surgeon: Shaaron Lamar HERO, MD;  [facility];  Service: Endoscopy;  Laterality: N/A;  3:00pm    ESOPHAGOGASTRODUODENOSCOPY (EGD) WITH PROPOFOL  N/A 09/02/2019   Procedure: ESOPHAGOGASTRODUODENOSCOPY (EGD) WITH PROPOFOL ;  Surgeon: Shila Gustav GAILS, MD;  [facility];  Service: Endoscopy;  Laterality: N/A;   mano probe placement   EYE SURGERY Bilateral    rk   IR GASTROSTOMY TUBE MOD SED  05/10/2020   IR RADIOLOGIST EVAL & MGMT  05/26/2020   MALONEY DILATION N/A 06/24/2018   Procedure: AGAPITO DILATION;  Surgeon: Shaaron Lamar HERO, MD;  [facility];  Service: Endoscopy;  Laterality: N/A;   MALONEY DILATION N/A 07/14/2019   Procedure: AGAPITO DILATION;  Surgeon: Shaaron Lamar HERO, MD;  [facility];  Service: Endoscopy;  Laterality: N/A;   NISSEN FUNDOPLICATION  09/2019   takedown   POLYPECTOMY  06/24/2018   Procedure: POLYPECTOMY;  Surgeon: Shaaron Lamar HERO, MD;  [facility];  Service: Endoscopy;;  colon    RF ablation of cervical nerves  RIGHT HEART CATH N/A 03/16/2024   Procedure: RIGHT HEART CATH;  Surgeon: Verlin Lonni BIRCH, MD;  Location: [facility] INVASIVE CV LAB;  Service: Cardiovascular;  Laterality: N/A;   ROTATOR CUFF REPAIR Right 11-21-2006  dr supple @[facility]     Family History  Problem Relation Age of Onset   Colon cancer Maternal Grandfather    Colon cancer Paternal Grandmother    Alzheimer's disease Mother    Heart disease Father    COPD Father    Breast cancer Maternal Grandmother    Liver cancer Brother    Colon polyps Neg Hx    Stomach cancer Neg Hx    Esophageal cancer Neg Hx    Rectal cancer Neg Hx     Social History   Socioeconomic History   Marital status: Married    Spouse name: Not on file   Number of children: Not on file   Years of education: Not on file   Highest education level: Not on file  Occupational History   Occupation: retired  Tobacco Use   Smoking status: Former    Current packs/day: 0.00    Average packs/day: 1.5 packs/day for 29.0 years (43.5 ttl pk-yrs)    Types: Cigarettes    Start  date: 89    Quit date: 07/1997    Years since quitting: 26.7    Passive exposure: Current   Smokeless tobacco: Never  Vaping Use   Vaping status: Never Used  Substance and Sexual Activity   Alcohol use: Not Currently    Comment: none in many years   Drug use: Never   Sexual activity: Not on file  Other Topics Concern   Not on file  Social History Narrative   Not on file   Social Drivers of Health   Financial Resource Strain: Not on file  Food Insecurity: No Food Insecurity (11/25/2023)   Hunger Vital Sign    Worried About Running Out of Food in the Last Year: Never true    Ran Out of Food in the Last Year: Never true  Transportation Needs: No Transportation Needs (11/25/2023)   PRAPARE - Administrator, Civil Service (Medical): No    Lack of Transportation (Non-Medical): No  Physical Activity: Not on file  Stress: Not on file  Social Connections: Moderately Integrated (11/25/2023)   Social Connection and Isolation Panel    Frequency of Communication with Friends and Family: More than three times a week    Frequency of Social Gatherings with Friends and Family: More than three times a week    Attends Religious Services: More than 4 times per year    Active Member of [employer] or Organizations: No    Attends Banker Meetings: Never    Marital Status: Married  Catering manager Violence: Not At Risk (11/25/2023)   Humiliation, Afraid, Rape, and Kick questionnaire    Fear of Current or Ex-Partner: No    Emotionally Abused: No    Physically Abused: No    Sexually Abused: No    Review of Systems  Respiratory:  Positive for apnea.   Psychiatric/Behavioral:  Positive for sleep disturbance.     Vitals:   04/13/24 1302  BP: (!) 142/66  Pulse: 78  Temp: 97.8 F (36.6 C)  SpO2: 94%     Physical Exam Constitutional:      Appearance: She is obese.  HENT:     Head: Normocephalic.     Mouth/Throat:     Mouth: Mucous membranes are moist.  Eyes:      General: No scleral icterus.    Pupils: Pupils are equal, round, and reactive to light.  Cardiovascular:     Rate and Rhythm: Normal rate.     Heart sounds: No murmur heard.    No friction rub.  Pulmonary:     Effort: No respiratory distress.     Breath sounds: No stridor. No wheezing or rhonchi.  Musculoskeletal:     Cervical back: No rigidity or tenderness.  Neurological:     Mental Status: She is alert.       04/13/2024    1:00 PM 02/06/2019    2:00 PM  Results of the Epworth flowsheet  Sitting and reading 1 3  Watching TV 1 3  Sitting, inactive in a public place (e.g. a theatre or a meeting) 1 2  As a passenger in a car for an hour without a break 2 2  Lying down to rest in the afternoon when circumstances permit 3 3  Sitting and talking to someone 0 1  Sitting quietly after a lunch without alcohol 0 2  In a car, while stopped for a few minutes in traffic 0 0  Total score 8 16    Data Reviewed: Previous pulmonary function test from 2020 shows reversible airway obstruction-this was reviewed  Previous sleep study showed moderate obstructive sleep apnea in 2020, REM AHI was high at 58 Documentation in Dr. Magdaleno records shows she was using CPAP and tolerating it well in 2021  CT chest from 09/05/2023 reviewed by myself - No infiltrative process in the lung  Assessment/Plan: History of obstructive sleep apnea - Has not been on CPAP recently - Some recurrence of symptoms - She wants to resume CPAP use  History of asthma COPD overlap - Will obtain a pulmonary function test - Increase Symbicort  from 80 to 160  Continue albuterol  use as needed  Asthma/COPD exacerbation - We will call in a course of antibiotics-azithromycin  and prednisone   Graded activities as tolerated  Encouraged to call with significant concerns  Jennet Epley MD [facility] 04/13/2024, 1:20 PM  CC: Robyn Satterfield, MD

## 2024-04-13 NOTE — Patient Instructions (Signed)
  Will call you in a course of antibiotic and steroid Azithromycin  and prednisone   Dose of Symbicort  increased from Symbicort  80 to 160 We will schedule you for pulmonary function test  We will schedule you for a home sleep study - Update you with results of your home sleep study as soon as it is reviewed  Follow-up in about 3 months  Call us  with significant concerns

## 2024-04-15 ENCOUNTER — Encounter: Payer: Self-pay | Admitting: Pulmonary Disease

## 2024-04-15 ENCOUNTER — Other Ambulatory Visit: Payer: Self-pay | Admitting: Pulmonary Disease

## 2024-04-15 DIAGNOSIS — C4372 Malignant melanoma of left lower limb, including hip: Secondary | ICD-10-CM | POA: Diagnosis not present

## 2024-04-15 MED ORDER — BUDESONIDE-FORMOTEROL FUMARATE 160-4.5 MCG/ACT IN AERO: 2.0000 | INHALATION_SPRAY | Freq: Two times a day (BID) | RESPIRATORY_TRACT | 6 refills | Status: DC

## 2024-04-22 DIAGNOSIS — Z48817 Encounter for surgical aftercare following surgery on the skin and subcutaneous tissue: Secondary | ICD-10-CM | POA: Diagnosis not present

## 2024-04-23 ENCOUNTER — Ambulatory Visit (INDEPENDENT_AMBULATORY_CARE_PROVIDER_SITE_OTHER)

## 2024-04-23 ENCOUNTER — Encounter: Payer: Self-pay | Admitting: *Deleted

## 2024-04-23 ENCOUNTER — Ambulatory Visit: Attending: Student | Admitting: Physician Assistant

## 2024-04-23 ENCOUNTER — Encounter: Payer: Self-pay | Admitting: Physician Assistant

## 2024-04-23 VITALS — BP 110/79 | HR 72 | Ht 61.5 in | Wt 169.4 lb

## 2024-04-23 DIAGNOSIS — R600 Localized edema: Secondary | ICD-10-CM | POA: Insufficient documentation

## 2024-04-23 DIAGNOSIS — I5189 Other ill-defined heart diseases: Secondary | ICD-10-CM | POA: Diagnosis not present

## 2024-04-23 DIAGNOSIS — E785 Hyperlipidemia, unspecified: Secondary | ICD-10-CM | POA: Diagnosis not present

## 2024-04-23 DIAGNOSIS — R0609 Other forms of dyspnea: Secondary | ICD-10-CM | POA: Diagnosis not present

## 2024-04-23 DIAGNOSIS — R002 Palpitations: Secondary | ICD-10-CM | POA: Insufficient documentation

## 2024-04-23 DIAGNOSIS — I10 Essential (primary) hypertension: Secondary | ICD-10-CM | POA: Insufficient documentation

## 2024-04-23 DIAGNOSIS — R42 Dizziness and giddiness: Secondary | ICD-10-CM | POA: Diagnosis not present

## 2024-04-23 MED ORDER — FUROSEMIDE 20 MG PO TABS: 20.0000 mg | ORAL_TABLET | Freq: Every day | ORAL | 3 refills | Status: AC | PRN

## 2024-04-23 NOTE — Patient Instructions (Signed)
 Medication Instructions:  Your physician has recommended you make the following change in your medication:   Take Lasix  20 mg as needed   *If you need a refill on your cardiac medications before your next appointment, please call your pharmacy*  Lab Work: NONE   If you have labs (blood work) drawn today and your tests are completely normal, you will receive your results only by: MyChart Message (if you have MyChart) OR A paper copy in the mail If you have any lab test that is abnormal or we need to change your treatment, we will call you to review the results.  Testing/Procedures: NONE   Follow-Up: At [facility], you and your health needs are our priority.  As part of our continuing mission to provide you with exceptional heart care, our providers are all part of one team.  This team includes your primary Cardiologist (physician) and Advanced Practice Providers or APPs (Physician Assistants and Nurse Practitioners) who all work together to provide you with the care you need, when you need it.  Your next appointment:    December  Provider:   Lorette Kapur, PA-C     We recommend signing up for the patient portal called MyChart.  Sign up information is provided on this After Visit Summary.  MyChart is used to connect with patients for Virtual Visits (Telemedicine).  Patients are able to view lab/test results, encounter notes, upcoming appointments, etc.  Non-urgent messages can be sent to your provider as well.   To learn more about what you can do with MyChart, go to [email or web address].   Other Instructions Thank you for choosing [facility]!

## 2024-04-23 NOTE — Progress Notes (Signed)
 Cardiology Office Note:  .   Date:  04/23/2024  ID:  Robyn Ashley, DOB 01-01-1951, MRN [id number] PCP: Medicine, [facility]  [facility] Providers Cardiologist:  Diannah SHAUNNA Maywood, MD Cardiology APP:  Sheron Lorette GRADE, PA-C {  History of Present Illness: Robyn   VERNON Ashley is a 73 y.o. female  with PMHx of nonobstructive CAD (CCTA 11/2023: Mild nonobstructive CAD, CAC score of 21, 53 percentile, TPV moderate at 119 at 39 percentile), Diastolic dysfunction, HTN, HLD who reports to [facility] office for follow up.   Last seen in heartcare 03/12/2024 by Dr. Mallipeddi.  Reported ongoing chronic stable DOE for 1 year that has worsened over the last few months with minimal exertion.  Also noted previous leg swelling resolved with p.o. Lasix  20 mg daily.  Reviewed PFTs from 2021 which showed positive bronchodilator response and minimal obstruction.  Noted her current symptoms are out of proportion to lung disease but has repeat PFT pending.  Ordered BMP and CBC, which was stable.  Continued on Lipitor 20 mg daily, Farxiga  10 mg daily, Lasix  20 mg daily, losartan  50 mg daily, Toprol  XL 50 mg daily.  Scheduled for outpatient right heart cath on 03/16/2024 showed mild elevated PA pressures and normal PCWP.  Today, reports stable unchanged DOE associated with minimal exertion such as walking in the house, showering and getting dressed.  Reports dizziness with quick position changes or quick turning.  Also notes palpitations about 2-3 times per week that is more noticeable at night when she is laying down lasting for less than 1 minute and described as flipping.  Denies any edema, orthopnea, PND, syncope. Reports compliance with medications.  She cooks food with salt, consumes snacks frequently, drinks sodas and drinks 3 to 4 glasses of water  per day.  Activity is limited due to DOE. Denies tobacco use/alcohol/drug use.   ROS: 10 point review of system has been reviewed and considered negative  except ones been listed in the HPI.   Studies Reviewed: .   Cardiac CTA 12/05/2023 IMPRESSION: 1. Coronary calcium  score of 20.8. This was 33 percentile for age and sex matched control. 2. Normal coronary origin with right dominance. 3. CAD-RADS 2. Mild non-obstructive CAD (25-49%). Consider non-atherosclerotic causes of chest pain. Consider preventive therapy and risk factor modification. 4. Total plaque volume 119 mm3 which is 39th percentile for age- and sex-matched controls (calcified plaque 15 mm3; non-calcified plaque 104 mm3, Low attenuation 1 mm3). TPV is moderate. The noncardiac portion of this study will be interpreted in separate report by the radiologist. IMPRESSION: Hiatal hernia. Recommended: Chest tightness was noncardiac and possibly related to hiatal hernia.  Encouraged to follow-up with PCP.  Continue Lipitor 20 mg nightly with goal LDL <70.   ECHO 12/16/2023 IMPRESSIONS   1. Left ventricular ejection fraction, by estimation, is 65 to 70%. The  left ventricle has normal function. The left ventricle has no regional  wall motion abnormalities. There is moderate left ventricular hypertrophy.  Left ventricular diastolic  parameters are consistent with Grade I diastolic dysfunction (impaired  relaxation). Elevated left atrial pressure.   2. Right ventricular systolic function is normal. The right ventricular  size is normal. Tricuspid regurgitation signal is inadequate for assessing  PA pressure.   3. The mitral valve is normal in structure. No evidence of mitral valve  regurgitation. No evidence of mitral stenosis.   4. The aortic valve is tricuspid. Aortic valve regurgitation is not  visualized. No aortic stenosis is present.  5. The inferior vena cava is normal in size with greater than 50%  respiratory variability, suggesting right atrial pressure of 3 mmHg.   RHC 03/16/2024 Conclusion Mild elevation PA pressure Normal PCWP  Physical Exam:   VS:  BP 110/79 (BP  Location: Right Arm, Cuff Size: Normal)   Pulse 72   Ht 5' 1.5 (1.562 m)   Wt 169 lb 6.4 oz (76.8 kg)   SpO2 98%   BMI 31.49 kg/m    Wt Readings from Last 3 Encounters:  04/23/24 169 lb 6.4 oz (76.8 kg)  04/13/24 171 lb (77.6 kg)  03/16/24 172 lb (78 kg)    GEN: Well nourished, well developed in no acute distress while sitting in chair.  NECK: No JVD; No carotid bruits CARDIAC: RRR, no murmurs, rubs, gallops RESPIRATORY:  Clear to auscultation without rales, wheezing or rhonchi  ABDOMEN: Soft, non-tender, non-distended EXTREMITIES:  No edema; No deformity; ace wrap on left leg due to melanoma excision  ASSESSMENT AND PLAN: .    DOE (dyspnea on exertion) LE edema Grade I diastolic dysfunction PFTs from 2021 which showed positive bronchodilator response and minimal obstruction.  Repeat PFTs still pending. Reviewed echo 11/2023 showed EF 65 to 70%, moderate LVH, G1 DD, elevated LA pressure.  Reviewed CCTA 11/2023 showed Mild nonobstructive CAD, CAC score of 21, 53 percentile, TPV moderate at 119 at 39 percentile Reviewed Right heart cath on 03/16/2024 showed mild elevated PA pressures and normal PCWP. Reports stable unchanged DOE associated with minimal exertion.  Previously noted LE edema that resolved with Lasix .  Continues to deny recurrence.  Also denies orthopnea or PND. Appears euvolemic on exam. 02/2024 K 3.5, CR 0.97 Continue Farxiga  10 mg daily Due to soft BP, recommended decreasing Lasix  to as needed. Suspect that DOE is noncardiac and more related to pulmonary.  Encouraged to continue to follow-up with pulmonary and complete PFT as scheduled on 07/13/2024.  Hyperlipidemia LDL goal <70 LDL 81 in 2021 and LFTs WNL in 08/2022. Request recent labs from PCP. If no recent FLP/LFT then will plan to order. Continue Lipitor 20 mg daily  Primary hypertension She does not monitor BP at home but does have blood pressure machine and agrees to do so.  Discussed proper BP measurement  protocol. BP this OV on the soft side today: 102/60 with repeat 110/76 Continue on losartan  50 mg daily and Toprol -XL 50 mg daily Due to soft BP, recommended decreasing Lasix  to as needed. Advised to contact office if BP elevated above 140/90 or low SBP 90-100's consistently. If blood pressure low then would consider decreasing losartan  to 25 mg daily If blood pressure elevated then would consider adding Toprol -XL 25 mg to the evening depending on zio results.  Encourage physical activity for 150 minutes per week and heart healthy low sodium diet. Discussed limiting sodium intake to < 2 grams daily.     Palpitations Zio in 2016 at [facility] showed overall NSR with rare PACs. Reports palpitations about 2-3 times per week that is more noticeable at night when she is laying down lasting for less than 1 minute and described as flipping. Order 2-week none live ZIO.  Will reassess based on results.  Dizziness Reports dizziness with quick position changes or quick turning.  Suspect related to soft Bps.  Suspect improvement with changing Lasix  to as needed as above. Encourage slow position changes, increasing daily water  intake. Recommended considering compression sock if symptoms still persist.   Advised to contact office if symptoms worsen.  Dispo: Follow-up in December with Scottie, PA-C  Signed, Lorette CINDERELLA Kapur, PA-C

## 2024-04-24 ENCOUNTER — Encounter

## 2024-04-24 DIAGNOSIS — G4733 Obstructive sleep apnea (adult) (pediatric): Secondary | ICD-10-CM

## 2024-04-29 DIAGNOSIS — Z48817 Encounter for surgical aftercare following surgery on the skin and subcutaneous tissue: Secondary | ICD-10-CM | POA: Diagnosis not present

## 2024-05-04 ENCOUNTER — Telehealth: Payer: Self-pay | Admitting: Pulmonary Disease

## 2024-05-04 DIAGNOSIS — G4733 Obstructive sleep apnea (adult) (pediatric): Secondary | ICD-10-CM

## 2024-05-04 DIAGNOSIS — Z23 Encounter for immunization: Secondary | ICD-10-CM | POA: Diagnosis not present

## 2024-05-04 NOTE — Telephone Encounter (Signed)
 Call patient  Sleep study result  Date of study: 04/24/2024  Impression: Moderate obstructive sleep apnea with moderate oxygen desaturations. AHI of 15.8, O2 nadir of 74%, oxygen was below 88% for 14 minutes  Recommendation: DME referral  Recommend CPAP therapy for moderate obstructive sleep apnea.  Auto-titrating CPAP with pressure settings of 5-15 should be appropriate, along with heated humidification using the patient's preferred mask.  Encourage weight loss measures.  Schedule follow-up in the office 4 to 6 weeks after initiation. of treatment

## 2024-05-05 DIAGNOSIS — J439 Emphysema, unspecified: Secondary | ICD-10-CM | POA: Diagnosis not present

## 2024-05-05 DIAGNOSIS — N390 Urinary tract infection, site not specified: Secondary | ICD-10-CM | POA: Diagnosis not present

## 2024-05-05 DIAGNOSIS — Z299 Encounter for prophylactic measures, unspecified: Secondary | ICD-10-CM | POA: Diagnosis not present

## 2024-05-05 DIAGNOSIS — E7800 Pure hypercholesterolemia, unspecified: Secondary | ICD-10-CM | POA: Diagnosis not present

## 2024-05-05 DIAGNOSIS — R350 Frequency of micturition: Secondary | ICD-10-CM | POA: Diagnosis not present

## 2024-05-05 DIAGNOSIS — J45909 Unspecified asthma, uncomplicated: Secondary | ICD-10-CM | POA: Diagnosis not present

## 2024-05-05 DIAGNOSIS — I10 Essential (primary) hypertension: Secondary | ICD-10-CM | POA: Diagnosis not present

## 2024-05-06 NOTE — Telephone Encounter (Signed)
 Called and spoke with patient,verbalized understanding,order placed.NFN

## 2024-05-07 DIAGNOSIS — Z1283 Encounter for screening for malignant neoplasm of skin: Secondary | ICD-10-CM | POA: Diagnosis not present

## 2024-05-07 DIAGNOSIS — Z85820 Personal history of malignant melanoma of skin: Secondary | ICD-10-CM | POA: Diagnosis not present

## 2024-05-07 DIAGNOSIS — D225 Melanocytic nevi of trunk: Secondary | ICD-10-CM | POA: Diagnosis not present

## 2024-05-07 DIAGNOSIS — Z08 Encounter for follow-up examination after completed treatment for malignant neoplasm: Secondary | ICD-10-CM | POA: Diagnosis not present

## 2024-05-11 DIAGNOSIS — R002 Palpitations: Secondary | ICD-10-CM | POA: Diagnosis not present

## 2024-05-12 ENCOUNTER — Ambulatory Visit: Payer: Self-pay | Admitting: Pulmonary Disease

## 2024-05-12 ENCOUNTER — Ambulatory Visit (INDEPENDENT_AMBULATORY_CARE_PROVIDER_SITE_OTHER): Admitting: Pulmonary Disease

## 2024-05-12 VITALS — BP 109/70 | HR 84

## 2024-05-12 DIAGNOSIS — G4733 Obstructive sleep apnea (adult) (pediatric): Secondary | ICD-10-CM | POA: Diagnosis not present

## 2024-05-12 DIAGNOSIS — J449 Chronic obstructive pulmonary disease, unspecified: Secondary | ICD-10-CM

## 2024-05-12 MED ORDER — TRELEGY ELLIPTA 200-62.5-25 MCG/ACT IN AEPB
1.0000 | INHALATION_SPRAY | Freq: Every day | RESPIRATORY_TRACT | 3 refills | Status: AC
  Filled 2024-08-21: qty 60, 30d supply, fill #0

## 2024-05-12 MED ORDER — PREDNISONE 20 MG PO TABS: 20.0000 mg | ORAL_TABLET | Freq: Every day | ORAL | 0 refills | Status: DC

## 2024-05-12 NOTE — Telephone Encounter (Signed)
 FYI Only or Action Required?: FYI only for provider.  Patient is followed in Pulmonology for COPD, last seen on 04/13/2024 by Neda Jennet LABOR, MD.  Called Nurse Triage reporting Shortness of Breath.  Symptoms began several days ago.  Interventions attempted: Rescue inhaler, Maintenance inhaler, Nebulizer treatments, and Home oxygen use.  Symptoms are: unchanged.  Triage Disposition: See HCP Within 4 Hours (Or PCP Triage)  Patient/caregiver understands and will follow disposition?: Yes         Copied from CRM [id number]. Topic: Clinical - Red Word Triage >> May 12, 2024 10:10 AM Ismael A wrote: Red Word that prompted transfer to Nurse Triage: was SOB and wheezing on sunday used rescue inhaler and nebulizer, was feeling better on Monday as far as SOB but was wheezing last night, feeling SOB again today - stated she is taking Cipro for UTI prescribed last week Reason for Disposition  [1] MILD difficulty breathing (e.g., minimal/no SOB at rest, SOB with walking, pulse < 100) AND [2] NEW-onset or WORSE than normal  Answer Assessment - Initial Assessment Questions E2C2 Pulmonary Triage - Initial Assessment Questions Chief Complaint (e.g., cough, sob, wheezing, fever, chills, sweat or additional symptoms) *Go to specific symptom protocol after initial questions. SOB with exertion - above baseline. Chest tightness, wheezing - used albuterol  INH/NEB, and used husband's O2 at 2L with relief of sx. Dry cough. Recently finished cipro for UTI prescribed by PCP.  How long have symptoms been present? Sunday  Have you tested for COVID or Flu? Note: If not, ask patient if a home test can be taken. If so, instruct patient to call back for positive results. No  MEDICINES:   Have you used any OTC meds to help with symptoms? No If yes, ask What medications? N/a  Have you used your inhalers/maintenance medication? Yes If yes, What medications? Albuterol  INH - last used  Sunday Albuterol  NEB with minimal relief - last used Sunday budesonide -formoterol  (BREYNA ) - 2 puffs twice daily  If inhaler, ask How many puffs and how often? Note: Review instructions on medication in the chart. See above  OXYGEN: Do you wear supplemental oxygen? No If yes, How many liters are you supposed to use? N/a  Do you monitor your oxygen levels? No If yes, What is your reading (oxygen level) today? Unable to find SpO2.  What is your usual oxygen saturation reading?  (Note: Pulmonary O2 sats should be 90% or greater) N/a          1. RESPIRATORY STATUS: Describe your breathing? (e.g., wheezing, shortness of breath, unable to speak, severe coughing)      See above 2. ONSET: When did this breathing problem begin?      See above 3. PATTERN Does the difficult breathing come and go, or has it been constant since it started?      Comes and goes 4. SEVERITY: How bad is your breathing? (e.g., mild, moderate, severe)      Mild with exertion Triager does not appreciate audible SOB/wheezing during call. Pt is speaking in full sentences.  5. RECURRENT SYMPTOM: Have you had difficulty breathing before? If Yes, ask: When was the last time? and What happened that time?      Yes, March/April of this year. Reports treated in ED with admission. 6. CARDIAC HISTORY: Do you have any history of heart disease? (e.g., heart attack, angina, bypass surgery, angioplasty)      Denies. 7. LUNG HISTORY: Do you have any history of lung disease?  (e.g., pulmonary  embolus, asthma, emphysema)     COPD asthma 8. CAUSE: What do you think is causing the breathing problem?      Flare  9. OTHER SYMPTOMS: Do you have any other symptoms? (e.g., chest pain, cough, dizziness, fever, runny nose)     denies 10. O2 SATURATION MONITOR:  Do you use an oxygen saturation monitor (pulse oximeter) at home? If Yes, ask: What is your reading (oxygen level) today? What is your  usual oxygen saturation reading? (e.g., 95%)       N/a 11. PREGNANCY: Is there any chance you are pregnant? When was your last menstrual period?       N/a 12. TRAVEL: Have you traveled out of the country in the last month? (e.g., travel history, exposures)       N/a  Protocols used: Breathing Difficulty-A-AH

## 2024-05-12 NOTE — Patient Instructions (Signed)
 We will change her inhaler to Trelegy 200 from Symbicort  that you are currently using  Prescription for prednisone  to be sent to pharmacy for you  Follow-up with your breathing study  Keep your appointment in the middle of December  Call us  with significant concerns  Try and watch a few short videos that help reinforce how you use your inhalers correctly  If you feel you are not able to use your albuterol  well, you can pick up a spacer device online

## 2024-05-12 NOTE — Progress Notes (Signed)
 Robyn Ashley    [id number]    1951/04/02  Primary Care Physician:Hoffman, Jacquline NOVAK, NP  Referring Physician: Medicine, [facility] [address] [place],  [place] [address]  Chief complaint:   Patient being seen for worsening shortness of breath Recent hospitalization for COPD exacerbation  HPI:  Patient with a history of asthma/COPD, obstructive sleep apnea  Breathing is feeling a little bit better  Has been using Symbicort , at some point was on Breo  She does have cough, congestion  No problems with CPAP  Past history of smoking quit over 20 years ago History of asthma/COPD  Cough and congestion No contact with anybody with a febrile illness recently Around great grandkids but no one with acute respiratory complaints  Was using CPAP previously but got away from using CPAP because of other health problems Did have a feeding tube that was removed-when she was having significant issues with this was when she stopped using CPAP Weight has gone up a little bit Last use CPAP about 3 years ago Does not have any problems tolerating CPAP  Outpatient Encounter Medications as of 05/12/2024  Medication Sig   acetaminophen  (TYLENOL ) 500 MG tablet Take 500 mg by mouth every 8 (eight) hours as needed for mild pain (pain score 1-3).   albuterol  (ACCUNEB ) 1.25 MG/3ML nebulizer solution Take 3 mLs (1.25 mg total) by nebulization every 6 (six) hours as needed for wheezing or shortness of breath.   albuterol  (PROVENTIL ) 2 MG tablet Take 2 mg by mouth 3 (three) times daily.   albuterol  (VENTOLIN  HFA) 108 (90 Base) MCG/ACT inhaler Inhale 2 puffs into the lungs every 4 (four) hours as needed for shortness of breath or wheezing.   atorvastatin  (LIPITOR) 20 MG tablet Take 20 mg by mouth at bedtime.   budesonide -formoterol  (BREYNA ) 160-4.5 MCG/ACT inhaler Inhale 2 puffs into the lungs every 12 (twelve) hours.   calcium  carbonate (OSCAL) 1500 (600 Ca) MG TABS tablet Take 600 mg by  mouth with breakfast.   D3 HIGH POTENCY 25 MCG (1000 UT) capsule Take 1,000 Units by mouth daily.   dapagliflozin  propanediol (FARXIGA ) 10 MG TABS tablet Take 1 tablet (10 mg total) by mouth daily before breakfast.   E-400 180 MG (400 UNIT) CAPS Take 1 capsule by mouth at bedtime.   FLUoxetine  (PROZAC ) 20 MG capsule Take 20 mg by mouth daily.   furosemide  (LASIX ) 20 MG tablet Take 1 tablet (20 mg total) by mouth daily as needed.   levothyroxine  (SYNTHROID , LEVOTHROID) 50 MCG tablet Take 50 mcg by mouth daily before breakfast.    losartan  (COZAAR ) 50 MG tablet Take 1 tablet (50 mg total) by mouth daily.   MAGNESIUM  CITRATE PO Take 1 tablet by mouth daily.   metoprolol  succinate (TOPROL -XL) 50 MG 24 hr tablet Take 50 mg by mouth at bedtime.    mirtazapine  (REMERON ) 30 MG tablet Take 30 mg by mouth daily.   omeprazole (PRILOSEC) 20 MG capsule Take 20 mg by mouth at bedtime.   Pseudoephedrine-Acetaminophen  (SINUS PO) Take 1 tablet by mouth daily as needed (Allergies).   No facility-administered encounter medications on file as of 05/12/2024.    Allergies as of 05/12/2024 - Review Complete 05/12/2024  Allergen Reaction Noted   Norvasc [amlodipine besylate] Other (See Comments) 05/07/2018   Tetracyclines & related Other (See Comments) 01/10/2011   Aspirin Other (See Comments) 05/07/2018   Dexilant  [dexlansoprazole ] Diarrhea 08/20/2018   Nsaids Other (See Comments) 05/07/2018  Past Medical History:  Diagnosis Date   Allergy    Anemia    Anginal pain 2015   Depression    Dyslipidemia 05/10/2020   Dysrhythmia 2015   SVT   Essential hypertension 05/10/2020   GERD (gastroesophageal reflux disease)    Hiatal hernia    History of degenerative disc disease    History of nuclear stress test 07/27/2013   in care everywhere per cardiology note by dr norman:  test normal   Hyperlipidemia    Hypertension    Hypothyroidism    Hypothyroidism (acquired) 05/10/2020   Intermittent palpitations     MVP (mitral valve prolapse)    per pt dx 1980s,  last echo done 2016 by dr norman [facility] cardiology in [place]) and last cardiology visit 11-02-2014 in epic;   01-27-2019 per pt episodic palpitations and takes toprol , currently followed by pcp   Narcolepsy    OSA (obstructive sleep apnea)    Not on CPAP   Osteoporosis    osteopenia   TMJ arthralgia     Past Surgical History:  Procedure Laterality Date   ABDOMINAL HYSTERECTOMY     BIOPSY  06/24/2018   Procedure: BIOPSY;  Surgeon: Shaaron Lamar HERO, MD;  [facility]: [facility];  Service: Endoscopy;;  (gastric) (colon)   BREAST CYST ASPIRATION     COLONOSCOPY N/A 06/24/2018   Procedure: COLONOSCOPY;  Surgeon: Shaaron Lamar HERO, MD;  [facility]: [facility];  Service: Endoscopy;  Laterality: N/A;  1:45pm   ESOPHAGEAL DILATION  09/02/2019   Procedure: ESOPHAGEAL DILATION;  Surgeon: Shila Gustav GAILS, MD;  Location: [facility];  Service: Endoscopy;;   ESOPHAGEAL MANOMETRY N/A 09/02/2019   Procedure: ESOPHAGEAL MANOMETRY (EM);  Surgeon: Shila Gustav GAILS, MD;  Location: [facility];  Service: Endoscopy;  Laterality: N/A;   ESOPHAGEAL MANOMETRY N/A 02/06/2021   Procedure: ESOPHAGEAL MANOMETRY (EM);  Surgeon: Shila Gustav GAILS, MD;  Location: [facility];  Service: Endoscopy;  Laterality: N/A;   ESOPHAGOGASTRODUODENOSCOPY N/A 06/24/2018   Procedure: ESOPHAGOGASTRODUODENOSCOPY (EGD);  Surgeon: Shaaron Lamar HERO, MD;  [facility];  Service: Endoscopy;  Laterality: N/A;   ESOPHAGOGASTRODUODENOSCOPY N/A 07/14/2019   Procedure: ESOPHAGOGASTRODUODENOSCOPY (EGD);  Surgeon: Shaaron Lamar HERO, MD;  [facility];  Service: Endoscopy;  Laterality: N/A;  3:00pm   ESOPHAGOGASTRODUODENOSCOPY (EGD) WITH PROPOFOL  N/A 09/02/2019   Procedure: ESOPHAGOGASTRODUODENOSCOPY (EGD) WITH PROPOFOL ;  Surgeon: Shila Gustav GAILS, MD;  Location: [facility];  Service: Endoscopy;  Laterality: N/A;   mano probe placement   EYE SURGERY Bilateral    rk    IR GASTROSTOMY TUBE MOD SED  05/10/2020   IR RADIOLOGIST EVAL & MGMT  05/26/2020   MALONEY DILATION N/A 06/24/2018   Procedure: AGAPITO DILATION;  Surgeon: Shaaron Lamar HERO, MD;  [facility];  Service: Endoscopy;  Laterality: N/A;   MALONEY DILATION N/A 07/14/2019   Procedure: AGAPITO DILATION;  Surgeon: Shaaron Lamar HERO, MD;  [facility];  Service: Endoscopy;  Laterality: N/A;   NISSEN FUNDOPLICATION  09/2019   takedown   POLYPECTOMY  06/24/2018   Procedure: POLYPECTOMY;  Surgeon: Shaaron Lamar HERO, MD;  [facility];  Service: Endoscopy;;  colon    RF ablation of cervical nerves     RIGHT HEART CATH N/A 03/16/2024   Procedure: RIGHT HEART CATH;  Surgeon: Verlin Lonni BIRCH, MD;  Location: [facility] IN[facility] CV LAB;  Service: Cardiovascular;  Laterality: N/A;   ROTATOR CUFF REPAIR Right 11-21-2006  dr supple @[facility]   Family History  Problem Relation Age of Onset   Colon cancer Maternal Grandfather    Colon cancer Paternal Grandmother    Alzheimer's disease Mother    Heart disease Father    COPD Father    Breast cancer Maternal Grandmother    Liver cancer Brother    Colon polyps Neg Hx    Stomach cancer Neg Hx    Esophageal cancer Neg Hx    Rectal cancer Neg Hx     Social History   Socioeconomic History   Marital status: Married    Spouse name: Not on file   Number of children: Not on file   Years of education: Not on file   Highest education level: Not on file  Occupational History   Occupation: retired  Tobacco Use   Smoking status: Former    Current packs/day: 0.00    Average packs/day: 1.5 packs/day for 29.0 years (43.5 ttl pk-yrs)    Types: Cigarettes    Start date: 83    Quit date: 07/1997    Years since quitting: 26.8    Passive exposure: Current   Smokeless tobacco: Never  Vaping Use   Vaping status: Never Used  Substance and Sexual Activity   Alcohol use: Not Currently    Comment: none in many years   Drug use: Never    Sexual activity: Not on file  Other Topics Concern   Not on file  Social History Narrative   Not on file   Social Drivers of Health   Financial Resource Strain: Not on file  Food Insecurity: No Food Insecurity (11/25/2023)   Hunger Vital Sign    Worried About Running Out of Food in the Last Year: Never true    Ran Out of Food in the Last Year: Never true  Transportation Needs: No Transportation Needs (11/25/2023)   PRAPARE - Administrator, Civil Service (Medical): No    Lack of Transportation (Non-Medical): No  Physical Activity: Not on file  Stress: Not on file  Social Connections: Moderately Integrated (11/25/2023)   Social Connection and Isolation Panel    Frequency of Communication with Friends and Family: More than three times a week    Frequency of Social Gatherings with Friends and Family: More than three times a week    Attends Religious Services: More than 4 times per year    Active Member of [employer] or Organizations: No    Attends Banker Meetings: Never    Marital Status: Married  Catering manager Violence: Not At Risk (11/25/2023)   Humiliation, Afraid, Rape, and Kick questionnaire    Fear of Current or Ex-Partner: No    Emotionally Abused: No    Physically Abused: No    Sexually Abused: No    Review of Systems  Respiratory:  Positive for apnea, cough and shortness of breath.   Psychiatric/Behavioral:  Positive for sleep disturbance.     Vitals:   05/12/24 1441  BP: 109/70  Pulse: 84  SpO2: 93%     Physical Exam Constitutional:      Appearance: She is obese.  HENT:     Head: Normocephalic.     Mouth/Throat:     Mouth: Mucous membranes are moist.  Eyes:     General: No scleral icterus.    Pupils: Pupils are equal, round, and reactive to light.  Cardiovascular:     Rate and Rhythm: Normal rate.     Heart sounds: No murmur heard.    No  friction rub.  Pulmonary:     Effort: No respiratory distress.     Breath sounds: No  stridor. No wheezing or rhonchi.  Musculoskeletal:     Cervical back: No rigidity or tenderness.  Neurological:     Mental Status: She is alert.  Psychiatric:        Mood and Affect: Mood normal.       04/13/2024    1:00 PM 02/06/2019    2:00 PM  Results of the Epworth flowsheet  Sitting and reading 1 3  Watching TV 1 3  Sitting, inactive in a public place (e.g. a theatre or a meeting) 1 2  As a passenger in a car for an hour without a break 2 2  Lying down to rest in the afternoon when circumstances permit 3 3  Sitting and talking to someone 0 1  Sitting quietly after a lunch without alcohol 0 2  In a car, while stopped for a few minutes in traffic 0 0  Total score 8 16    Data Reviewed: Previous pulmonary function test from 2020 shows reversible airway obstruction-this was reviewed  Previous sleep study showed moderate obstructive sleep apnea in 2020, REM AHI was high at 58 Documentation in Dr. Magdaleno records shows she was using CPAP and tolerating it well in 2021  CT chest from 09/05/2023 reviewed by myself - No infiltrative process in the lung  Assessment/Plan: COPD asthma overlap With having more symptoms recently Will switch from Symbicort  to Trelegy 200  Continue albuterol  as needed  Inhaler technique was reviewed and discussed with her today  She has an appointment for middle of December, has an order for PFT to be done on the day of follow-up  Recent home sleep study with AHI of 18.5 -Auto CPAP 5-15 requested  Asthma/COPD exacerbation - We will call in a course of antibiotics-azithromycin  and prednisone   Graded activities as tolerated  Encouraged to call with significant concerns    Jennet Epley MD [facility] 05/12/2024, 2:46 PM  CC: Medicine, [facility]

## 2024-05-13 DIAGNOSIS — N390 Urinary tract infection, site not specified: Secondary | ICD-10-CM | POA: Diagnosis not present

## 2024-05-13 NOTE — Telephone Encounter (Signed)
 Pt seen in clinic 05/12/24 by Dr. Neda  Nothing needed

## 2024-05-20 DIAGNOSIS — Z48817 Encounter for surgical aftercare following surgery on the skin and subcutaneous tissue: Secondary | ICD-10-CM | POA: Diagnosis not present

## 2024-05-25 ENCOUNTER — Ambulatory Visit: Payer: Self-pay | Admitting: Physician Assistant

## 2024-05-25 DIAGNOSIS — R002 Palpitations: Secondary | ICD-10-CM | POA: Diagnosis not present

## 2024-05-25 MED ORDER — METOPROLOL SUCCINATE ER 50 MG PO TB24: 50.0000 mg | ORAL_TABLET | Freq: Two times a day (BID) | ORAL | 3 refills | Status: AC

## 2024-06-08 DIAGNOSIS — Z1339 Encounter for screening examination for other mental health and behavioral disorders: Secondary | ICD-10-CM | POA: Diagnosis not present

## 2024-06-08 DIAGNOSIS — Z79899 Other long term (current) drug therapy: Secondary | ICD-10-CM | POA: Diagnosis not present

## 2024-06-08 DIAGNOSIS — Z299 Encounter for prophylactic measures, unspecified: Secondary | ICD-10-CM | POA: Diagnosis not present

## 2024-06-08 DIAGNOSIS — Z0000 Encounter for general adult medical examination without abnormal findings: Secondary | ICD-10-CM | POA: Diagnosis not present

## 2024-06-08 DIAGNOSIS — E039 Hypothyroidism, unspecified: Secondary | ICD-10-CM | POA: Diagnosis not present

## 2024-06-08 DIAGNOSIS — Z1331 Encounter for screening for depression: Secondary | ICD-10-CM | POA: Diagnosis not present

## 2024-06-08 DIAGNOSIS — F331 Major depressive disorder, recurrent, moderate: Secondary | ICD-10-CM | POA: Diagnosis not present

## 2024-06-08 DIAGNOSIS — Z7189 Other specified counseling: Secondary | ICD-10-CM | POA: Diagnosis not present

## 2024-06-08 DIAGNOSIS — E7800 Pure hypercholesterolemia, unspecified: Secondary | ICD-10-CM | POA: Diagnosis not present

## 2024-06-08 DIAGNOSIS — I10 Essential (primary) hypertension: Secondary | ICD-10-CM | POA: Diagnosis not present

## 2024-06-28 NOTE — Progress Notes (Unsigned)
 Cardiology Office Note:  .   Date:  06/29/2024  ID:  Marcos JONELLE Georges, DOB 10/12/1950, MRN [id number] PCP: Rosan Jacquline NOVAK, NP  [facility] Providers Cardiologist:  Diannah SHAUNNA Maywood, MD Cardiology APP:  Sheron Lorette GRADE, PA-C {  History of Present Illness: Robyn   NYONNA Ashley is a 73 y.o. female  with PMHx of nonobstructive CAD (CCTA 11/2023: Mild nonobstructive CAD, CAC score of 21, 53 percentile, TPV moderate at 119 at 39 percentile), Diastolic dysfunction, HTN, HLD, Nonsustained SVT (Zio 9-04/2024: NSR with avg HR 77, HR range 55-226 bpm, 19 brief nonsustained SVT, 1 brief NSVT, <1% PAC/PVC), OSA on CPAP who reports to [facility] office for follow up.   Last seen in heartcare 04/23/2024 by myself for follow up. Reported stable unchanged DOE associated with minimal exertion. Suspected that DOE is noncardiac and more related to pulmonary. Encouraged to continue to follow up with pulmonary and PFT as scheduled on 07/13/2024. Noted that LE edema resolved with Lasix , however also reported dizziness with position changes and quick turns. Decreased lasix  to prn due to soft BP and dizziness. Reported palpitations about 2-3 times per week that is more noticeable at night when she is laying down lasting for less than 1 minute and described as flipping. Follow up zio showed NSR with avg HR 77 (range 55-226 bpm), 19 brief nonsustained SVT, 1 brief NSVT, <1% PAC/PVC. Increased Toprol  from 50 mg daily to BID.   Today, reports palpitations and dizziness have resolved.  Reports ongoing stable unchanged DOE with minimal exertion.  She plans to continue to follow with pulmonary and complete upcoming PFT as scheduled.  She has not needed to take Lasix  as needed. Denies chest pain, shortness of breath, palpitations, syncope, presyncope, dizziness, orthopnea, PND, swelling or significant weight changes, acute bleeding, or claudication.  Reports compliance with medications.  She admits to eating processed  snacks frequently and cooks food with salt but does not use table salt on food.  She drinks soda and very little water  daily.  Due to the holidays she has been more active lately, however DOE is a limiting factor. Denies tobacco use/alcohol/drug use  Denies any recent hospitalizations or visits to the emergency department.   She reports that her primary care for carotid bruits on exam and offered carotid ultrasound, however patient thought she had already completed this test with heart care.  On my exam, no carotid bruits on exam.  Offered to order carotid ultrasound but patient prefers to decline for now and will follow-up with PCP.  ROS: 10 point review of system has been reviewed and considered negative except ones been listed in the HPI.   Studies Reviewed: .   Cardiac CTA 12/05/2023 IMPRESSION: 1. Coronary calcium  score of 20.8. This was 30 percentile for age and sex matched control. 2. Normal coronary origin with right dominance. 3. CAD-RADS 2. Mild non-obstructive CAD (25-49%). Consider non-atherosclerotic causes of chest pain. Consider preventive therapy and risk factor modification. 4. Total plaque volume 119 mm3 which is 39th percentile for age- and sex-matched controls (calcified plaque 15 mm3; non-calcified plaque 104 mm3, Low attenuation 1 mm3). TPV is moderate.The noncardiac portion of this study will be interpreted in separate report by the radiologist. IMPRESSION: Hiatal hernia. Recommended: Chest tightness was noncardiac and possibly related to hiatal hernia.  Encouraged to follow-up with PCP.  Continue Lipitor 20 mg nightly with goal LDL <70.    ECHO 12/16/2023 IMPRESSIONS   1. Left ventricular ejection fraction, by estimation,  is 65 to 70%. The left ventricle has normal function. The left ventricle has no regional wall motion abnormalities. There is moderate left ventricular hypertrophy. Left ventricular diastolic  parameters are consistent with Grade I diastolic dysfunction  (impaired relaxation). Elevated left atrial pressure.   2. Right ventricular systolic function is normal. The right ventricular size is normal. Tricuspid regurgitation signal is inadequate for assessing  PA pressure.   3. The mitral valve is normal in structure. No evidence of mitral valve regurgitation. No evidence of mitral stenosis.   4. The aortic valve is tricuspid. Aortic valve regurgitation is not  visualized. No aortic stenosis is present.   5. The inferior vena cava is normal in size with greater than 50% respiratory variability, suggesting right atrial pressure of 3 mmHg.    RHC 03/16/2024 Conclusion Mild elevation PA pressure Normal PCWP  Zio 9-04/2024   Patch weartime was for 11 days and 1 hour.   Normal sinus rhythm predominantly ranging from 56 to 226 bpm with an average HR 77 bpm.   19 runs of nonsustained SVT occurred, fastest interval lasting 5 beats and the longest interval lasting 10 beats.   1 run of NSVT lasting 4 beats.  No evidence of A-fib/flutter, high-grade AV block or pauses.   <1% PAC burden and <1% PVC burden.   Patient triggered events correlated with NSR, 79 bpm and PAC.  Physical Exam:   VS:  BP 108/64   Pulse 62   Ht 5' 1.5 (1.562 m)   Wt 175 lb 3.2 oz (79.5 kg)   SpO2 94%   BMI 32.57 kg/m    Wt Readings from Last 3 Encounters:  06/29/24 175 lb 3.2 oz (79.5 kg)  04/23/24 169 lb 6.4 oz (76.8 kg)  04/13/24 171 lb (77.6 kg)    GEN: Well nourished, well developed in no acute distress while sitting in chair.  NECK: No JVD; No carotid bruits CARDIAC: RRR, no murmurs, rubs, gallops RESPIRATORY:  Clear to auscultation without rales, wheezing or rhonchi  ABDOMEN: Soft, non-tender, non-distended EXTREMITIES:  No edema; No deformity   ASSESSMENT AND PLAN: .   DOE (dyspnea on exertion) LE edema Grade I diastolic dysfunction PFTs from 2021 which showed positive bronchodilator response and minimal obstruction.  Repeat PFTs still pending. Echo 11/2023  showed EF 65 to 70%, moderate LVH, G1 DD, elevated LA pressure.  CCTA 11/2023 showed Mild nonobstructive CAD, CAC score of 21, 53 percentile, TPV moderate at 119 at 39 percentile Right heart cath on 03/16/2024 showed mild elevated PA pressures and normal PCWP. Reports stable unchanged DOE associated with minimal exertion.  She has not needed Lasix  prn and denies recurrent ankle edema. Also, denies orthopnea or PND. Appears euvolemic on exam. 05/2024 K & Cr WNL  Continue Farxiga  10 mg daily, Lasix  20 mg PRN Due to soft BP, recommended decreasing Lasix  to as needed. Suspect that DOE is noncardiac and more related to pulmonary.  Encouraged to continue to follow-up with pulmonary and complete PFT as scheduled on 07/13/2024.   Hyperlipidemia LDL goal <70 LDL 81 in 2021 and LFTs WNL in 08/2022.  LDL 96 and LFT WNL in 05/2024.  Increase Lipitor from 20 to 40 mg daily.  Ordered 8-week fasting lipid panel.   Primary hypertension She does not monitor BP at home but does have BP machine and agrees to do so.  Discussed proper BP measurement protocol. BP this OV on the soft side today 100/60 with repeat BP 104/64.  Denies any dizziness,  Iightheadness or presyncope Continue on losartan  50 mg daily, Toprol -XL 50 mg BID, Lasix  PRN  Offered to decreased losartan  to 25 mg daily, however patient declines since she is not symptomatic.  Discussed to contact office if develops any symptoms.  Recommended to monitor BP at home 2-3 times per week or if symptomatic. Advised to contact office if BP elevated above 140/90 or low SBP 90-100's consistently. Encourage physical activity for 150 minutes per week and heart healthy low sodium diet. Discussed limiting sodium intake to < 2 grams daily.      Palpitations Zio in 2016 at [facility] showed overall NSR with rare PACs. Zio 03/2024: NSR with avg HR 77 (range 55-226 bpm), 19 brief nonsustained SVT, 1 brief NSVT, <1% PAC/PVC. Since increasing Toprol  from 50 mg daily to BID,  she reports palpitations have resolved.  Continue Toprol  50 mg BID. Continue to monitor.    Dizziness Previously reported dizziness with quick position changes or quick turning. Suspected secondary to soft Bps and expected improvement with Lasix  PRN.  Denies any dizziness, although BP remains soft today.  Encourage slow position changes, increasing daily water  intake, daily compression socks Advised to contact office if symptoms worsen.    Dispo:  Follow up in 3-4 month with me.   Signed, Lorette CINDERELLA Kapur, PA-C

## 2024-06-29 ENCOUNTER — Encounter: Payer: Self-pay | Admitting: Physician Assistant

## 2024-06-29 ENCOUNTER — Ambulatory Visit: Attending: Physician Assistant | Admitting: Physician Assistant

## 2024-06-29 VITALS — BP 108/64 | HR 62 | Ht 61.5 in | Wt 175.2 lb

## 2024-06-29 DIAGNOSIS — I5189 Other ill-defined heart diseases: Secondary | ICD-10-CM | POA: Insufficient documentation

## 2024-06-29 DIAGNOSIS — R600 Localized edema: Secondary | ICD-10-CM | POA: Diagnosis not present

## 2024-06-29 DIAGNOSIS — R0609 Other forms of dyspnea: Secondary | ICD-10-CM | POA: Insufficient documentation

## 2024-06-29 DIAGNOSIS — E785 Hyperlipidemia, unspecified: Secondary | ICD-10-CM | POA: Insufficient documentation

## 2024-06-29 DIAGNOSIS — R002 Palpitations: Secondary | ICD-10-CM | POA: Diagnosis not present

## 2024-06-29 DIAGNOSIS — I10 Essential (primary) hypertension: Secondary | ICD-10-CM | POA: Diagnosis not present

## 2024-06-29 DIAGNOSIS — Z79899 Other long term (current) drug therapy: Secondary | ICD-10-CM | POA: Insufficient documentation

## 2024-06-29 MED ORDER — ATORVASTATIN CALCIUM 40 MG PO TABS: 40.0000 mg | ORAL_TABLET | Freq: Every day | ORAL | 3 refills | Status: AC

## 2024-06-29 NOTE — Patient Instructions (Addendum)
 Medication Instructions:  Your physician has recommended you make the following change in your medication:  Increase atorvastatin  to 40 mg daily Continue all other medications as prescribed.  Labwork: Your physician recommends that you return for a FASTING lipid profile in 8 weeks around (08/31/2024). Please do not eat or drink for at least 8 hours when you have this done. You may take your medications that morning with a sip of water . [facility]  Testing/Procedures: none  Follow-Up: Your physician recommends that you schedule a follow-up appointment in: 3-4 months with [employer].  Any Other Special Instructions Will Be Listed Below (If Applicable).  If you need a refill on your cardiac medications before your next appointment, please call your pharmacy.

## 2024-07-07 DIAGNOSIS — M79671 Pain in right foot: Secondary | ICD-10-CM | POA: Diagnosis not present

## 2024-07-07 DIAGNOSIS — I10 Essential (primary) hypertension: Secondary | ICD-10-CM | POA: Diagnosis not present

## 2024-07-07 DIAGNOSIS — J439 Emphysema, unspecified: Secondary | ICD-10-CM | POA: Diagnosis not present

## 2024-07-07 DIAGNOSIS — Z299 Encounter for prophylactic measures, unspecified: Secondary | ICD-10-CM | POA: Diagnosis not present

## 2024-07-07 DIAGNOSIS — I272 Pulmonary hypertension, unspecified: Secondary | ICD-10-CM | POA: Diagnosis not present

## 2024-07-07 DIAGNOSIS — R52 Pain, unspecified: Secondary | ICD-10-CM | POA: Diagnosis not present

## 2024-07-07 DIAGNOSIS — M2011 Hallux valgus (acquired), right foot: Secondary | ICD-10-CM | POA: Diagnosis not present

## 2024-07-07 DIAGNOSIS — M21611 Bunion of right foot: Secondary | ICD-10-CM | POA: Diagnosis not present

## 2024-07-10 DIAGNOSIS — E2839 Other primary ovarian failure: Secondary | ICD-10-CM | POA: Diagnosis not present

## 2024-07-13 ENCOUNTER — Ambulatory Visit: Admitting: Pulmonary Disease

## 2024-07-13 ENCOUNTER — Encounter

## 2024-07-16 ENCOUNTER — Telehealth: Payer: Self-pay

## 2024-07-16 NOTE — Telephone Encounter (Signed)
 Tried to reach out to patient VM/ Lm to return call. We can not guarantee a same day PFT with an appointment   ( Cancel PFT then make new appointment for PFT)     Copied from CRM #[id number]. Topic: Appointments - Scheduling Inquiry for Clinic >> Jul 14, 2024  3:51 PM Rozanna G wrote: Reason for CRM: pt called to resch her PFT and OV but it will not let me schedule PFT and she wants both on the same day.

## 2024-07-21 ENCOUNTER — Ambulatory Visit: Admitting: Pulmonary Disease

## 2024-07-21 ENCOUNTER — Encounter: Payer: Self-pay | Admitting: Pulmonary Disease

## 2024-07-21 DIAGNOSIS — J449 Chronic obstructive pulmonary disease, unspecified: Secondary | ICD-10-CM

## 2024-07-21 NOTE — Patient Instructions (Signed)
 I will see you back in 6 months  Follow-up with your breathing study in April  Continue using your inhalers  Make sure you are using your CPAP every night  Call us  with significant concerns

## 2024-07-21 NOTE — Progress Notes (Unsigned)
 "              Robyn Ashley    [id number]    06-23-1951  Primary Care Physician:Hoffman, Jacquline NOVAK, NP  Referring Physician: Neda Jennet LABOR, MD [address] [place],  [place] [address] Okay Chief complaint:   Patient being seen for worsening shortness of breath Recent hospitalization for COPD exacerbation  HPI:  Patient with a history of asthma/COPD, obstructive sleep apnea  Breathing is feeling a little bit better  Has been using Symbicort , at some point was on Breo  She does have cough, congestion  No problems with CPAP  Past history of smoking quit over 20 years ago History of asthma/COPD  Cough and congestion No contact with anybody with a febrile illness recently Around great grandkids but no one with acute respiratory complaints  Was using CPAP previously but got away from using CPAP because of other health problems Did have a feeding tube that was removed-when she was having significant issues with this was when she stopped using CPAP Weight has gone up a little bit Last use CPAP about 3 years ago Does not have any problems tolerating CPAP  Outpatient Encounter Medications as of 07/21/2024  Medication Sig   acetaminophen  (TYLENOL ) 500 MG tablet Take 500 mg by mouth every 8 (eight) hours as needed for mild pain (pain score 1-3).   albuterol  (ACCUNEB ) 1.25 MG/3ML nebulizer solution Take 3 mLs (1.25 mg total) by nebulization every 6 (six) hours as needed for wheezing or shortness of breath.   albuterol  (PROVENTIL ) 2 MG tablet Take 2 mg by mouth 3 (three) times daily.   albuterol  (VENTOLIN  HFA) 108 (90 Base) MCG/ACT inhaler Inhale 2 puffs into the lungs every 4 (four) hours as needed for shortness of breath or wheezing.   atorvastatin  (LIPITOR) 40 MG tablet Take 1 tablet (40 mg total) by mouth daily.   calcium  carbonate (OSCAL) 1500 (600 Ca) MG TABS tablet Take 600 mg by mouth with breakfast.   D3 HIGH POTENCY 25 MCG (1000 UT) capsule Take 1,000  Units by mouth daily.   dapagliflozin  propanediol (FARXIGA ) 10 MG TABS tablet Take 1 tablet (10 mg total) by mouth daily before breakfast.   E-400 180 MG (400 UNIT) CAPS Take 1 capsule by mouth at bedtime.   FLUoxetine  (PROZAC ) 20 MG capsule Take 20 mg by mouth daily.   Fluticasone -Umeclidin-Vilant (TRELEGY ELLIPTA ) 200-62.5-25 MCG/ACT AEPB Inhale 1 puff into the lungs daily.   furosemide  (LASIX ) 20 MG tablet Take 1 tablet (20 mg total) by mouth daily as needed.   levothyroxine  (SYNTHROID , LEVOTHROID) 50 MCG tablet Take 50 mcg by mouth daily before breakfast.    losartan  (COZAAR ) 50 MG tablet Take 1 tablet (50 mg total) by mouth daily.   MAGNESIUM  CITRATE PO Take 1 tablet by mouth daily.   metoprolol  succinate (TOPROL -XL) 50 MG 24 hr tablet Take 1 tablet (50 mg total) by mouth in the morning and at bedtime. Take with or immediately following a meal.   mirtazapine  (REMERON ) 30 MG tablet Take 30 mg by mouth daily.   omeprazole (PRILOSEC) 20 MG capsule Take 20 mg by mouth at bedtime.   Pseudoephedrine-Acetaminophen  (SINUS PO) Take 1 tablet by mouth daily as needed (Allergies).   No facility-administered encounter medications on file as of 07/21/2024.    Allergies as of 07/21/2024 - Review Complete 07/21/2024  Allergen Reaction Noted   Norvasc [amlodipine besylate] Other (See Comments) 05/07/2018   Tetracyclines & related Other (See Comments)  01/10/2011   Aspirin Other (See Comments) 05/07/2018   Dexilant  [dexlansoprazole ] Diarrhea 08/20/2018   Nsaids Other (See Comments) 05/07/2018    Past Medical History:  Diagnosis Date   Allergy    Anemia    Anginal pain 2015   Depression    Dyslipidemia 05/10/2020   Dysrhythmia 2015   SVT   Essential hypertension 05/10/2020   GERD (gastroesophageal reflux disease)    Hiatal hernia    History of degenerative disc disease    History of nuclear stress test 07/27/2013   in care everywhere per cardiology note by dr  norman:  test normal   Hyperlipidemia    Hypertension    Hypothyroidism    Hypothyroidism (acquired) 05/10/2020   Intermittent palpitations    MVP (mitral valve prolapse)    per pt dx 1980s,  last echo done 2016 by dr norman [facility] in [place]) and last cardiology visit 11-02-2014 in epic;   01-27-2019 per pt episodic palpitations and takes toprol , currently followed by pcp   Narcolepsy    OSA (obstructive sleep apnea)    Not on CPAP   Osteoporosis    osteopenia   TMJ arthralgia     Past Surgical History:  Procedure Laterality Date   ABDOMINAL HYSTERECTOMY     BIOPSY  06/24/2018   Procedure: BIOPSY;  Surgeon: Shaaron Lamar HERO, MD;  [facility]: [facility];  Service: Endoscopy;;  (gastric) (colon)   BREAST CYST ASPIRATION     COLONOSCOPY N/A 06/24/2018   Procedure: COLONOSCOPY;  Surgeon: Shaaron Lamar HERO, MD;  [facility]: [facility];  Service: Endoscopy;  Laterality: N/A;  1:45pm   ESOPHAGEAL DILATION  09/02/2019   Procedure: ESOPHAGEAL DILATION;  Surgeon: Shila Gustav GAILS, MD;  Location: [facility];  Service: Endoscopy;;   ESOPHAGEAL MANOMETRY N/A 09/02/2019   Procedure: ESOPHAGEAL MANOMETRY (EM);  Surgeon: Shila Gustav GAILS, MD;  Location: [facility];  Service: Endoscopy;  Laterality: N/A;   ESOPHAGEAL MANOMETRY N/A 02/06/2021   Procedure: ESOPHAGEAL MANOMETRY (EM);  Surgeon: Shila Gustav GAILS, MD;  Location: [facility];  Service: Endoscopy;  Laterality: N/A;   ESOPHAGOGASTRODUODENOSCOPY N/A 06/24/2018   Procedure: ESOPHAGOGASTRODUODENOSCOPY (EGD);  Surgeon: Shaaron Lamar HERO, MD;  [facility];  Service: Endoscopy;  Laterality: N/A;   ESOPHAGOGASTRODUODENOSCOPY N/A 07/14/2019   Procedure: ESOPHAGOGASTRODUODENOSCOPY (EGD);  Surgeon: Shaaron Lamar HERO, MD;  [facility];  Service: Endoscopy;  Laterality: N/A;  3:00pm   ESOPHAGOGASTRODUODENOSCOPY (EGD) WITH PROPOFOL  N/A 09/02/2019   Procedure: ESOPHAGOGASTRODUODENOSCOPY (EGD) WITH  PROPOFOL ;  Surgeon: Shila Gustav GAILS, MD;  Location: [facility];  Service: Endoscopy;  Laterality: N/A;   mano probe placement   EYE SURGERY Bilateral    rk   IR GASTROSTOMY TUBE MOD SED  05/10/2020   IR RADIOLOGIST EVAL & MGMT  05/26/2020   MALONEY DILATION N/A 06/24/2018   Procedure: AGAPITO DILATION;  Surgeon: Shaaron Lamar HERO, MD;  [facility];  Service: Endoscopy;  Laterality: N/A;   MALONEY DILATION N/A 07/14/2019   Procedure: AGAPITO DILATION;  Surgeon: Shaaron Lamar HERO, MD;  [facility];  Service: Endoscopy;  Laterality: N/A;   NISSEN FUNDOPLICATION  09/2019   takedown   POLYPECTOMY  06/24/2018   Procedure: POLYPECTOMY;  Surgeon: Shaaron Lamar HERO, MD;  [facility];  Service: Endoscopy;;  colon    RF ablation of cervical nerves     RIGHT HEART CATH N/A 03/16/2024   Procedure: RIGHT HEART CATH;  Surgeon: Verlin Lonni BIRCH, MD;  Location:  [facility];  Service: Cardiovascular;  Laterality: N/A;   ROTATOR CUFF REPAIR Right 11-21-2006  dr supple @[facility]     Family History  Problem Relation Age of Onset   Colon cancer Maternal Grandfather    Colon cancer Paternal Grandmother    Alzheimer's disease Mother    Heart disease Father    COPD Father    Breast cancer Maternal Grandmother    Liver cancer Brother    Colon polyps Neg Hx    Stomach cancer Neg Hx    Esophageal cancer Neg Hx    Rectal cancer Neg Hx     Social History   Socioeconomic History   Marital status: Married    Spouse name: Not on file   Number of children: Not on file   Years of education: Not on file   Highest education level: Not on file  Occupational History   Occupation: retired  Tobacco Use   Smoking status: Former    Current packs/day: 0.00    Average packs/day: 1.5 packs/day for 29.0 years (43.5 ttl pk-yrs)    Types: Cigarettes    Start date: 49    Quit date: 07/1997    Years since quitting: 26.9    Passive exposure:  Current   Smokeless tobacco: Never  Vaping Use   Vaping status: Never Used  Substance and Sexual Activity   Alcohol  use: Not Currently    Comment: none in many years   Drug use: Never   Sexual activity: Not on file  Other Topics Concern   Not on file  Social History Narrative   Not on file   Social Drivers of Health   Tobacco Use: Medium Risk (07/21/2024)   Patient History    Smoking Tobacco Use: Former    Smokeless Tobacco Use: Never    Passive Exposure: Current  Physicist, Medical Strain: Not on file  Food Insecurity: No Food Insecurity (11/25/2023)   Hunger Vital Sign    Worried About Running Out of Food in the Last Year: Never true    Ran Out of Food in the Last Year: Never true  Transportation Needs: No Transportation Needs (11/25/2023)   PRAPARE - Administrator, Civil Service (Medical): No    Lack of Transportation (Non-Medical): No  Physical Activity: Not on file  Stress: Not on file  Social Connections: Moderately Integrated (11/25/2023)   Social Connection and Isolation Panel    Frequency of Communication with Friends and Family: More than three times a week    Frequency of Social Gatherings with Friends and Family: More than three times a week    Attends Religious Services: More than 4 times per year    Active Member of [employer] or Organizations: No    Attends Banker Meetings: Never    Marital Status: Married  Catering Manager Violence: Not At Risk (11/25/2023)   Humiliation, Afraid, Rape, and Kick questionnaire    Fear of Current or Ex-Partner: No    Emotionally Abused: No    Physically Abused: No    Sexually Abused: No  Depression (PHQ2-9): Not on file  Alcohol  Screen: Not on file  Housing: Low Risk (11/25/2023)   Housing Stability Vital Sign    Unable to Pay for Housing in the Last Year: No    Number of Times Moved in the Last Year: 0    Homeless in the Last Year: No  Utilities: Not At Risk (11/25/2023)    AHC Utilities    Threatened with  loss of utilities: No  Health Literacy: Not on file    Review of Systems  Respiratory:  Positive for apnea, cough and shortness of breath.   Psychiatric/Behavioral:  Positive for sleep disturbance.     Vitals:   07/21/24 1020  BP: 128/77  Pulse: 64  SpO2: 95%     Physical Exam Constitutional:      Appearance: She is obese.  HENT:     Head: Normocephalic.     Mouth/Throat:     Mouth: Mucous membranes are moist.  Eyes:     General: No scleral icterus.    Pupils: Pupils are equal, round, and reactive to light.  Cardiovascular:     Rate and Rhythm: Normal rate.     Heart sounds: No murmur heard.    No friction rub.  Pulmonary:     Effort: No respiratory distress.     Breath sounds: No stridor. No wheezing or rhonchi.  Musculoskeletal:     Cervical back: No rigidity or tenderness.  Neurological:     Mental Status: She is alert.  Psychiatric:        Mood and Affect: Mood normal.       04/13/2024    1:00 PM 02/06/2019    2:00 PM  Results of the Epworth flowsheet  Sitting and reading 1 3  Watching TV 1 3  Sitting, inactive in a public place (e.g. a theatre or a meeting) 1 2  As a passenger in a car for an hour without a break 2 2  Lying down to rest in the afternoon when circumstances permit 3 3  Sitting and talking to someone 0 1  Sitting quietly after a lunch without alcohol  0 2  In a car, while stopped for a few minutes in traffic 0 0  Total score 8 16    Data Reviewed: Previous pulmonary function test from 2020 shows reversible airway obstruction-this was reviewed  Previous sleep study showed moderate obstructive sleep apnea in 2020, REM AHI was high at 58 Documentation in Dr. Magdaleno records shows she was using CPAP and tolerating it well in 2021  CT chest from 09/05/2023 reviewed by myself - No infiltrative process in the lung  Assessment/Plan: COPD asthma overlap With having more symptoms recently Will switch from  Symbicort  to Trelegy 200  Continue albuterol  as needed  Inhaler technique was reviewed and discussed with her today  She has an appointment for middle of December, has an order for PFT to be done on the day of follow-up  Recent home sleep study with AHI of 18.5 -Auto CPAP 5-15 requested  Asthma/COPD exacerbation - We will call in a course of antibiotics-azithromycin  and prednisone   Graded activities as tolerated  Encouraged to call with significant concerns    Jennet Epley MD [facility] 07/21/2024, 11:33 AM  CC: Epley Jennet LABOR, MD   "

## 2024-08-21 ENCOUNTER — Other Ambulatory Visit (HOSPITAL_BASED_OUTPATIENT_CLINIC_OR_DEPARTMENT_OTHER): Payer: Self-pay

## 2024-09-28 ENCOUNTER — Ambulatory Visit: Admitting: Physician Assistant

## 2024-11-02 ENCOUNTER — Encounter

## 2024-11-02 ENCOUNTER — Ambulatory Visit: Admitting: Pulmonary Disease

## 2025-01-25 ENCOUNTER — Encounter

## 2025-01-25 ENCOUNTER — Ambulatory Visit: Admitting: Pulmonary Disease
